# Patient Record
Sex: Female | Born: 1935 | Race: White | Hispanic: No | Smoking: Never smoker
Health system: Southern US, Community
[De-identification: ages and names within clinical notes are randomized; demographics above are authoritative.]

## PROBLEM LIST (undated history)

## (undated) ENCOUNTER — Emergency Department (HOSPITAL_COMMUNITY): Discharge status: Absent without leave | Payer: 59

## (undated) DIAGNOSIS — G4700 Insomnia, unspecified: Secondary | ICD-10-CM

## (undated) DIAGNOSIS — N3010 Interstitial cystitis (chronic) without hematuria: Secondary | ICD-10-CM

## (undated) DIAGNOSIS — I639 Cerebral infarction, unspecified: Secondary | ICD-10-CM

## (undated) DIAGNOSIS — I10 Essential (primary) hypertension: Secondary | ICD-10-CM

## (undated) DIAGNOSIS — E785 Hyperlipidemia, unspecified: Secondary | ICD-10-CM

## (undated) HISTORY — PX: ABDOMINAL HYSTERECTOMY: SHX81

---

## 1998-05-08 ENCOUNTER — Other Ambulatory Visit: Admission: RE | Admit: 1998-05-08 | Discharge: 1998-05-08 | Payer: Self-pay | Admitting: Urology

## 1999-10-22 ENCOUNTER — Emergency Department (HOSPITAL_COMMUNITY): Admission: EM | Admit: 1999-10-22 | Discharge: 1999-10-22 | Payer: Self-pay | Admitting: Internal Medicine

## 2000-01-16 ENCOUNTER — Other Ambulatory Visit: Admission: RE | Admit: 2000-01-16 | Discharge: 2000-01-16 | Payer: Self-pay | Admitting: Obstetrics and Gynecology

## 2000-11-14 ENCOUNTER — Encounter: Payer: Self-pay | Admitting: Internal Medicine

## 2000-11-14 ENCOUNTER — Encounter: Admission: RE | Admit: 2000-11-14 | Discharge: 2000-11-14 | Payer: Self-pay | Admitting: Internal Medicine

## 2001-03-04 ENCOUNTER — Encounter (INDEPENDENT_AMBULATORY_CARE_PROVIDER_SITE_OTHER): Payer: Self-pay | Admitting: Specialist

## 2001-03-04 ENCOUNTER — Ambulatory Visit (HOSPITAL_COMMUNITY): Admission: RE | Admit: 2001-03-04 | Discharge: 2001-03-04 | Payer: Self-pay | Admitting: Urology

## 2001-11-04 ENCOUNTER — Emergency Department (HOSPITAL_COMMUNITY): Admission: EM | Admit: 2001-11-04 | Discharge: 2001-11-04 | Payer: Self-pay | Admitting: Emergency Medicine

## 2001-11-04 ENCOUNTER — Encounter: Payer: Self-pay | Admitting: Emergency Medicine

## 2002-03-24 ENCOUNTER — Emergency Department (HOSPITAL_COMMUNITY): Admission: EM | Admit: 2002-03-24 | Discharge: 2002-03-24 | Payer: Self-pay | Admitting: Emergency Medicine

## 2002-04-14 ENCOUNTER — Other Ambulatory Visit: Admission: RE | Admit: 2002-04-14 | Discharge: 2002-04-14 | Payer: Self-pay | Admitting: Obstetrics and Gynecology

## 2004-01-11 ENCOUNTER — Encounter: Admission: RE | Admit: 2004-01-11 | Discharge: 2004-01-11 | Payer: Self-pay | Admitting: Gastroenterology

## 2004-12-07 ENCOUNTER — Ambulatory Visit (HOSPITAL_COMMUNITY): Admission: RE | Admit: 2004-12-07 | Discharge: 2004-12-07 | Payer: Self-pay

## 2005-01-02 ENCOUNTER — Emergency Department (HOSPITAL_COMMUNITY): Admission: EM | Admit: 2005-01-02 | Discharge: 2005-01-03 | Payer: Self-pay | Admitting: Emergency Medicine

## 2005-04-13 ENCOUNTER — Emergency Department (HOSPITAL_COMMUNITY): Admission: EM | Admit: 2005-04-13 | Discharge: 2005-04-14 | Payer: Self-pay | Admitting: Emergency Medicine

## 2006-04-27 ENCOUNTER — Encounter: Admission: RE | Admit: 2006-04-27 | Discharge: 2006-04-27 | Payer: Self-pay | Admitting: Orthopedic Surgery

## 2007-07-10 ENCOUNTER — Encounter: Admission: RE | Admit: 2007-07-10 | Discharge: 2007-07-10 | Payer: Self-pay | Admitting: Orthopedic Surgery

## 2007-09-28 ENCOUNTER — Encounter: Admission: RE | Admit: 2007-09-28 | Discharge: 2007-09-28 | Payer: Self-pay | Admitting: Neurosurgery

## 2008-03-03 ENCOUNTER — Inpatient Hospital Stay (HOSPITAL_COMMUNITY): Admission: RE | Admit: 2008-03-03 | Discharge: 2008-03-09 | Payer: Self-pay | Admitting: Neurosurgery

## 2008-03-04 ENCOUNTER — Ambulatory Visit: Payer: Self-pay | Admitting: Physical Medicine & Rehabilitation

## 2008-09-06 ENCOUNTER — Encounter: Admission: RE | Admit: 2008-09-06 | Discharge: 2008-09-06 | Payer: Self-pay | Admitting: Neurosurgery

## 2009-07-25 ENCOUNTER — Ambulatory Visit (HOSPITAL_COMMUNITY): Admission: RE | Admit: 2009-07-25 | Discharge: 2009-07-25 | Payer: Self-pay | Admitting: Neurosurgery

## 2010-05-25 ENCOUNTER — Encounter: Admission: RE | Admit: 2010-05-25 | Discharge: 2010-05-25 | Payer: Self-pay | Admitting: Internal Medicine

## 2010-07-02 ENCOUNTER — Emergency Department (HOSPITAL_COMMUNITY): Admission: EM | Admit: 2010-07-02 | Discharge: 2010-07-02 | Payer: Self-pay | Admitting: Emergency Medicine

## 2010-08-04 ENCOUNTER — Observation Stay (HOSPITAL_COMMUNITY): Admission: EM | Admit: 2010-08-04 | Discharge: 2010-08-05 | Payer: Self-pay | Admitting: Emergency Medicine

## 2010-11-11 ENCOUNTER — Encounter: Payer: Self-pay | Admitting: Internal Medicine

## 2011-01-02 LAB — CBC
HCT: 38.8 % (ref 36.0–46.0)
HCT: 40.7 % (ref 36.0–46.0)
Hemoglobin: 13 g/dL (ref 12.0–15.0)
Hemoglobin: 13.1 g/dL (ref 12.0–15.0)
MCH: 26 pg (ref 26.0–34.0)
MCH: 26.6 pg (ref 26.0–34.0)
MCHC: 32.2 g/dL (ref 30.0–36.0)
MCHC: 33.5 g/dL (ref 30.0–36.0)
MCV: 79.5 fL (ref 78.0–100.0)
MCV: 80.9 fL (ref 78.0–100.0)
Platelets: 235 10*3/uL (ref 150–400)
Platelets: 259 10*3/uL (ref 150–400)
RBC: 4.88 MIL/uL (ref 3.87–5.11)
RBC: 5.03 MIL/uL (ref 3.87–5.11)
RDW: 15.7 % — ABNORMAL HIGH (ref 11.5–15.5)
RDW: 16.1 % — ABNORMAL HIGH (ref 11.5–15.5)
WBC: 8 10*3/uL (ref 4.0–10.5)
WBC: 8.7 10*3/uL (ref 4.0–10.5)

## 2011-01-02 LAB — POCT CARDIAC MARKERS
CKMB, poc: 1.3 ng/mL (ref 1.0–8.0)
Myoglobin, poc: 65.4 ng/mL (ref 12–200)
Troponin i, poc: <0.05 ng/mL (ref 0.00–0.09)

## 2011-01-02 LAB — COMPREHENSIVE METABOLIC PANEL
ALT: 24 U/L (ref 0–35)
AST: 24 U/L (ref 0–37)
Albumin: 3.6 g/dL (ref 3.5–5.2)
Alkaline Phosphatase: 69 U/L (ref 39–117)
BUN: 12 mg/dL (ref 6–23)
CO2: 23 mEq/L (ref 19–32)
Calcium: 9.1 mg/dL (ref 8.4–10.5)
Chloride: 105 mEq/L (ref 96–112)
Creatinine, Ser: 0.9 mg/dL (ref 0.4–1.2)
GFR calc Af Amer: >60 mL/min (ref >60)
GFR calc non Af Amer: >60 mL/min (ref >60)
Glucose, Bld: 100 mg/dL — ABNORMAL HIGH (ref 70–99)
Potassium: 4.3 mEq/L (ref 3.5–5.1)
Sodium: 139 mEq/L (ref 135–145)
Total Bilirubin: 0.5 mg/dL (ref 0.3–1.2)
Total Protein: 6.5 g/dL (ref 6.0–8.3)

## 2011-01-02 LAB — URINALYSIS, ROUTINE W REFLEX MICROSCOPIC
Bilirubin Urine: NEGATIVE
Glucose, UA: NEGATIVE mg/dL
Hgb urine dipstick: NEGATIVE
Ketones, ur: NEGATIVE mg/dL
Nitrite: NEGATIVE
Protein, ur: NEGATIVE mg/dL
Specific Gravity, Urine: 1.014 (ref 1.005–1.030)
Urobilinogen, UA: 0.2 mg/dL (ref 0.0–1.0)
pH: 6 (ref 5.0–8.0)

## 2011-01-02 LAB — RAPID URINE DRUG SCREEN, HOSP PERFORMED
Amphetamines: NOT DETECTED
Barbiturates: NOT DETECTED
Benzodiazepines: POSITIVE — AB
Cocaine: NOT DETECTED
Opiates: NOT DETECTED
Tetrahydrocannabinol: NOT DETECTED

## 2011-01-02 LAB — DIFFERENTIAL
Basophils Absolute: 0 10*3/uL (ref 0.0–0.1)
Basophils Relative: 0 % (ref 0–1)
Eosinophils Absolute: 0.1 10*3/uL (ref 0.0–0.7)
Eosinophils Relative: 1 % (ref 0–5)
Lymphocytes Relative: 35 % (ref 12–46)
Lymphs Abs: 3.1 10*3/uL (ref 0.7–4.0)
Monocytes Absolute: 0.6 10*3/uL (ref 0.1–1.0)
Monocytes Relative: 7 % (ref 3–12)
Neutro Abs: 4.9 10*3/uL (ref 1.7–7.7)
Neutrophils Relative %: 57 % (ref 43–77)

## 2011-01-02 LAB — URINE MICROSCOPIC-ADD ON

## 2011-01-02 LAB — URINE CULTURE
Colony Count: 10000
Culture  Setup Time: 201110161136

## 2011-01-02 LAB — BASIC METABOLIC PANEL
BUN: 10 mg/dL (ref 6–23)
CO2: 23 mEq/L (ref 19–32)
Calcium: 9.3 mg/dL (ref 8.4–10.5)
Chloride: 105 mEq/L (ref 96–112)
Creatinine, Ser: 0.93 mg/dL (ref 0.4–1.2)
GFR calc Af Amer: >60 mL/min (ref >60)
GFR calc non Af Amer: 58 mL/min — ABNORMAL LOW (ref >60)
Glucose, Bld: 106 mg/dL — ABNORMAL HIGH (ref 70–99)
Potassium: 3.8 mEq/L (ref 3.5–5.1)
Sodium: 136 mEq/L (ref 135–145)

## 2011-01-02 LAB — TSH: TSH: 5.312 u[IU]/mL — ABNORMAL HIGH (ref 0.350–4.500)

## 2011-01-02 LAB — T4, FREE: Free T4: 1.43 ng/dL (ref 0.80–1.80)

## 2011-01-02 LAB — SEDIMENTATION RATE: Sed Rate: 7 mm/hr (ref 0–22)

## 2011-01-02 LAB — MAGNESIUM: Magnesium: 2 mg/dL (ref 1.5–2.5)

## 2011-01-03 LAB — POCT URINALYSIS DIPSTICK
Bilirubin Urine: NEGATIVE
Glucose, UA: NEGATIVE mg/dL
Hgb urine dipstick: NEGATIVE
Ketones, ur: NEGATIVE mg/dL
Nitrite: NEGATIVE
Protein, ur: NEGATIVE mg/dL
Specific Gravity, Urine: 1.02 (ref 1.005–1.030)
Urobilinogen, UA: 0.2 mg/dL (ref 0.0–1.0)
pH: 5.5 (ref 5.0–8.0)

## 2011-01-03 LAB — POCT I-STAT, CHEM 8
BUN: 21 mg/dL (ref 6–23)
Calcium, Ion: 1.19 mmol/L (ref 1.12–1.32)
Chloride: 107 mEq/L (ref 96–112)
Creatinine, Ser: 1.1 mg/dL (ref 0.4–1.2)
Glucose, Bld: 113 mg/dL — ABNORMAL HIGH (ref 70–99)
HCT: 46 % (ref 36.0–46.0)
Hemoglobin: 15.6 g/dL — ABNORMAL HIGH (ref 12.0–15.0)
Potassium: 4.2 mEq/L (ref 3.5–5.1)
Sodium: 138 mEq/L (ref 135–145)
TCO2: 22 mmol/L (ref 0–100)

## 2011-01-03 LAB — TSH: TSH: 1.556 u[IU]/mL (ref 0.350–4.500)

## 2011-03-05 NOTE — Op Note (Signed)
Wendy Andrews, Wendy Andrews NO.:  [id number]   MEDICAL RECORD NO.:  [id number]          PATIENT TYPE:  INP   LOCATION:  2899                         FACILITY:  [facility]   PHYSICIAN:  Cristi Loron, M.D.DATE OF BIRTH:  [birth year]   DATE OF PROCEDURE:  03/03/2008  DATE OF DISCHARGE:                               OPERATIVE REPORT   BRIEF HISTORY:  The patient is a 77 year old white female who suffered  from back and leg pain consistent with neurogenic claudication.  She was  worked up with lumbar MRI and CT which demonstrated the patient had a  spondylolisthesis at L4-L5 with severe stenosis as well as some lateral  recess stenosis of L5-S1 on the left.  I discussed various treatments  with the patient including surgery.  She has weighed the risks,  benefits, and alternatives of the surgery and proceeded with an L4-L5  decompression fusion with left L5 laminotomy and foraminotomy.   PREOPERATIVE DIAGNOSES:  1. L4-L5 grade 1 acquired spondylolisthesis.  2. Spinal stenosis.  3. Degenerative disk disease.  4. Facet arthropathy.  5. Left L5-S1 lateral recess stenosis.   POSTOPERATIVE DIAGNOSES:  1. L4-L5 grade 1 acquired spondylolisthesis.  2. Spinal stenosis.  3. Degenerative disk disease.  4. Facet arthropathy.  5. Left L5-S1 lateral recess stenosis.   PROCEDURE:  Bilateral L4 laminotomies and foraminotomies to decompress  the bilateral L4-L5 nerve roots.  Left L5 laminotomy to decompress left  S1 nerve root; L4-L5 transforaminal lumbar interbody fusion with local  morselized autograft bone and Actifuse/Vitoss bone graft extender;  insertion of L4-L5 interbody prosthesis (Capstone PEEK interbody  prosthesis); L4-L5 posterior nonsegmental instrumentation with Legacy  titanium pedicle screws and rods; L4-L5 posterolateral arthrodesis with  local morselized autograft bone, Vitoss bone graft extender and Actifuse  bone graft extender.   SURGEON:  Cristi Loron,  MD   ASSISTANT:  None.   ANESTHESIA:  Endotracheal.   ESTIMATED BLOOD LOSS:  250 mL.   SPECIMENS:  None.   DRAINS:  None.   COMPLICATIONS:  None.   PROCEDURE IN DETAIL:  The patient was brought to the operating room by  the anesthesia team.  General endotracheal anesthesia was induced.  The  patient was turned to the prone position on Wilson frame.  The  lumbosacral region was then prepared with Betadine scrub and Betadine  solution.  Sterile drapes were applied.  I then injected the area to be  incised with Marcaine with epinephrine solution, used a scalpel to make  a linear midline incision over the L4-L5 and L5-S1 interspaces.  I used  electrocautery to perform a bilateral subperiosteal dissection exposing  the spinous process and laminae of L3, L4, L5, and upper sacrum.  We  then obtained intraoperative radiograph to confirm our location and  inserted the Versa-Trac for exposure.  We began the decompression by  performing a left L5 and bilateral L4 laminotomies.  This decompression  was in excess of what was required to do a simple interbody fusion  because of the patient's severe facet arthropathy and spinal stenosis.  I then widened the laminotomies bilaterally at L4  removing the  ligamentum flavum, the medial aspect of facets.  I performed wide  foraminotomy about the bilateral L4-L5 nerve roots completing and  decompressing these nerves.  I then widened the laminotomy on the left  at L5 and performed a foraminotomy about the left S1 nerve root  completing and decompressing this nerve root.   Having completed the decompression, we now turned our attention to the  arthrodesis.  I incised the right L4-L5 intervertebral disk with a 15 mm  scalpel.  I performed a partial intervertebral discectomy with a  pituitary forceps.  We then prepared the vertebral endplates for the  interbody fusion by using the Epstein-Scoville curettes and removed the  soft tissue and the sized  disk.  We then used trial spacers and  determined to use a 12 x 26 mm Capstone PEEK interbody prosthesis.  I  prefilled this prosthesis with combination of Vitoss bone graft extender  and local autograft bone we obtained during the decompression.  I also  filled ventrally in the disk space with these substance as well.  We  then placed the prosthesis after retracting the neural structures out of  harms way.  We turned sideways using the bone tamps with good snug fit  of the prosthesis.  We then filled posteriorly in the disk space with  local autograft bone Vitoss and Actifuse completing the transforaminal  lumbar interbody fusion.   We now turned our attention to the instrumentation.  Under fluoroscopic  guidance, I cannulated the bilateral L4 and L5 pedicles with a bone  probe.  I tapped the pedicles with 5.5 tap and inserted a 6.5 x 50 mm  pedicle screws bilaterally at L4 and L5 under fluoroscopic guidance.  I  should mention that prior to placing the pedicle screw, I tapped the  pedicle with a bone probe to rule out cortical breeches.  After placing  the pedicle screws, we palpated along the medial aspect of the L4-L5  pedicles and noted there were no cortical breeches and the L4-L5 nerve  roots were not injured.  We then connected the unilateral pedicle screws  with lordotic rod.  We compressed the construct.  We then secured the  rod in place with the caps.  This completed the instrumentation.   We now turned our attention to posterolateral arthrodesis.  We used a  high-speed drill to decorticate the remainder of the L4-L5 facets pars  and transverse processes.  We then laid a combination of local autograft  bone.  Vitoss bone graft extender and Actifuse bone graft extender over  these decorticated posterolateral structures completing the  posterolateral arthrodesis.   We then inspected the thecal sac at L4-L5 and bilateral L4-L5 nerve  roots in the left and S1 nerve root and  noted the neural structures were  well decompressed.  We obtained hemostasis, irrigated the wound out with  bacitracin solution, and then removed the retractor.  We then  reapproximated the patient's thoracolumbar fascia with interrupted #1  Vicryl suture, subcutaneous tissue with interrupted 2-0 Vicryl suture,  and skin with Steri-Strips and Benzoin.  The wound was then coated with  bacitracin ointment.  A sterile dressing was applied.  The drapes were  removed and the patient was subsequently returned to the supine position  where she was extubated by the anesthesia team and transported to the  postanesthesia care unit in stable condition.  All sponge, instrument,  and needle counts were correct at the end of this case.  Cristi Loron, M.D.  Electronically Signed     JDJ/MEDQ  D:  03/03/2008  T:  03/04/2008  Job:  [id number]

## 2011-03-05 NOTE — Discharge Summary (Signed)
[name], Wendy Andrews NO.:  [id number]   MEDICAL RECORD NO.:  [id number]          PATIENT TYPE:  INP   LOCATION:  3013                         FACILITY:  [facility]   PHYSICIAN:  Cristi Loron, M.D.DATE OF BIRTH:  12/13/1929   DATE OF ADMISSION:  03/03/2008  DATE OF DISCHARGE:  03/09/2008                               DISCHARGE SUMMARY   BRIEF HISTORY:  The patient is a 77 year old white female who suffered  from back and leg pain consistent with neurogenic claudication.  She was  worked up with lumbar MRI and CT which demonstrated the patient had  spondylolisthesis at L4-5 with severe stenosis and some lateral recess  stenosis at L5-S1 on the left.  I discussed the various treatment  options with the patient including surgery.  She has weighed the risks,  benefits and alternatives to surgery and decided to proceed with an L4-5  decompression fusion with a left L5 laminotomy and foraminotomy.   For further details of this admission please refer to the typed history  and physical.   HOSPITAL COURSE:  The patient's was admitted to [facility] on  Mar 03, 2008, and on the day of admission I performed an L4-5  decompression and fusion.  The surgery went well (for full details of  this operation please refer to the operative note).   POSTOPERATIVE COURSE:  The patient's postoperative course was remarkable  only for a urinary tract infection.  She was treated with Cipro.  We had  PT/OT and PMR see the patient.  They did not think she needed inpatient  rehabilitation.  By Mar 09, 2008, the patient was afebrile, vital signs  stable.  She was eating well, ambulating well and felt ready to be  transferred to [facility] skilled nursing facility and was transferred.   DISCHARGE INSTRUCTIONS:  The patient was given written discharge  instructions and instructed to follow up with me in approximately 4  weeks.   DISCHARGE PRESCRIPTIONS:  Percocet 10/325 #100 one p.o.  q.4 h pain for  pain, valium 5 mg #50 one p.o. q.8 h p.r.n. for muscle spasms.   FINAL DIAGNOSES:  L4-5 grade 1 acquired spondylolisthesis; left L5-S1  lateral recess stenosis; spinal stenosis, degenerative disk disease,  facet arthropathy, lumbar radiculopathy/myelopathy.   PROCEDURE PERFORMED:  1. Bilateral L4 laminotomies and foraminotomies.  2. Decompression bilateral L4 and L5 nerve roots; left L5 laminotomy      to decompress the left S1 nerve root; L4-5 transforaminal lumbar      interbody fusion with local morselized autograft bone and      Actifuse/Vitoss bone graft      extender; insertion of L4-5 interbody prosthesis (Capstone PEEK      interbody prosthesis); L4-5 posterior nonsegmental instrumentation      with Legacy titanium pedicle screws and rods; L4-5 posterolateral      arthrodesis with local morselized autograft bone and Vitoss bone      graft extender and Actifuse bone graft extender.      Cristi Loron, M.D.  Electronically Signed     JDJ/MEDQ  D:  03/09/2008  T:  03/09/2008  Job:  [id number]

## 2011-03-05 NOTE — H&P (Signed)
Wendy Andrews, Wendy Andrews             ACCOUNT NO.:  [id number]   MEDICAL RECORD NO.:  [id number]         PATIENT TYPE:  LINP   LOCATION:                               FACILITY:  [facility]   PHYSICIAN:  Georges Lynch. Gioffre, M.D.DATE OF BIRTH:  [birth year]   DATE OF ADMISSION:  08/12/2007  DATE OF DISCHARGE:                              HISTORY & PHYSICAL   CHIEF COMPLAINT:  Lower back and bilateral leg pain.   HISTORY OF PRESENT ILLNESS:  The patient is a 77 year old female who has  been having lower back pain and bilateral leg pains with burning  sensation in both feet, feeling a sensation like both legs are going to  sleep.  She has been evaluated with a CT myelogram, and an MRI.  The  findings are consistent with severe spinal stenosis and complete block  at L4-5, L5-S1.  The patient has elected to proceed with a central  decompressive lumbar laminectomy at L4-5, L4-S1; and has talked with Dr.  Darrelyn Hillock at length about this upcoming procedure, and has elected to  proceed.   PAST MEDICAL HISTORY INCLUDES:  1. Manic depression.  2. Hypertension.  3. Reflux disease.  4. Recent cystitis with current treatment by Dr. Larey Dresser.  5. History of thyroid disease.   ALLERGIES:  Tetanus shots and penicillin.   CURRENT MEDICATIONS:  1. Seroquel  2. Toprol.  3. Aspirin.  4. __________ .  5. Diazepam.  6. Lipitor.  7. Lunesta.  8. Sulindac.  9. Alprazolam.  10.Levothyroxine.  11.Premarin  12.Prevacid.   PAST SURGICAL HISTORY INCLUDES:  1. A hysterectomy.  2. Knee surgery.  The patient denies any complications of the above-      mentioned surgical procedures.   REVIEW OF SYSTEMS:  Positive for issues related to her  anxiety/depression.  She has gone through multiple psychiatrists with  medication changes.  Current psychiatrist, Dr. __________ .  PULMONARY:  The patient denies.  CARDIAC/GI:  She denies.  GU:  She is currently  being treated for recent cystitis with a follow up in  the next couple of  days.  ENDOCRINE/HEMATOLOGIC:  She denies any problems at this time.   FAMILY MEDICAL HISTORY:  Mother is deceased from heart failure.  Father  is deceased from complications related to an MI, blood clots, and CVA.   SOCIAL HISTORY:  The patient is divorced.  She is retired, does not  smoke, or use alcohol.  She has no children.  No one to care for her  after surgery.  Lives in a single-story home.  Indicates that she will  need assistance postsurgery for care and discussed home health nurse  versus skilled nursing.   PHYSICAL EXAM:  VITAL SIGNS:  Height is 5 feet 4 inches.  Weight is 200  pounds.  Blood pressure 118/82, pulse of 74 and regular, respirations  12, patient is afebrile.  GENERAL:  This is a healthy-appearing, heavy-set female conscious,  alert, appears to be angry today.  HEENT:  Head was normocephalic.  Pupils equal, round and reactive.  Extraocular movements intact.  BACK:  Good range of motion of  her cervical spine  CHEST:  Lung sounds were clear and equal bilaterally.  HEART:  Regular rate and rhythm.  No murmurs, rubs or gallops.  ABDOMEN:  Obese-like, soft, bowel sounds present.  CVA region nontender.  EXTREMITIES:  Upper extremity has good range of motion.  Lower  Extremities have good range of motion of both hips, knees, and ankles.  NEURO:  The patient was conscious, alert, and a little bit on the angry  side today with history of panic/manic depression.  Otherwise appears to  be appropriate.  She is intact light touch sensation in bilateral lower  extremities.  Motor strength is 5/5 right and left.  She does have pain  in the lower back today with range of motion of her back.  PERIPHERAL VASCULAR: Carotid pulses were 2+ no bruits.  Radial pulses  2+, dorsalis pedis pulses were 1+.  She had lower extremity edema.  Some  varicosities, but no pigmentation changes.  BREAST, RECTAL AND GU:  Exams were deferred at this time.   IMPRESSION:  1.  Severe spinal stenosis L4-5, L5-S1.  2. History of manic depression.  3. Hypertension.  4. Reflux disease.  5. History of cystitis currently under treatment.  6. History of thyroid disease.   PLAN:  The patient will still need medical clearance for this upcoming  surgical procedure.  She plans to have an appointment with Dr. Delorse Lek on October 16.  She is also having a followup appointment  with Dr. Vonita Moss for her evaluation of her cystitis prior to this  surgical procedure.  Otherwise, the patient will undergo the routine  labs and tests prior to having this central decompression by Dr. Darrelyn Hillock  at [facility] on August 12, 2007.      Jamelle Rushing, P.A.    ______________________________  Georges Lynch Darrelyn Hillock, M.D.    RWK/MEDQ  D:  08/03/2007  T:  08/03/2007  Job:  [id number]

## 2011-03-08 NOTE — Op Note (Signed)
[facility]  Patient:    Wendy Andrews, Wendy Andrews                    MRN: [id number] Proc. Date: 03/04/01 Adm. Date:  [id number] Attending:  Lauree Chandler                           Operative Report  PREOPERATIVE DIAGNOSIS:  Interstitial cystitis.  POSTOPERATIVE DIAGNOSIS:  Interstitial cystitis.  PROCEDURE:  Cystoscopy, urethral dilation, hydraulic dilation of bladder, cold cup bladder biopsy, and instillation of Clorpactin and Marcaine.  SURGEON:  Maretta Bees. Vonita Moss, M.D.  ANESTHESIA:  General.  INDICATIONS:  This 77 year old lady has had chronic bladder pain and is on Elmiron and various therapies for interstitial cystitis.  She has had some exacerbation lately and comes in for further evaluation and work-up.  DESCRIPTION OF PROCEDURE:  The patient was brought to the operating room, and the urethra was dilated to 30 [place] and was somewhat tight.  Cystoscopy was performed.  There were no stones, tumors, or inflammatory lesions in the bladder.  She then had her bladder filled to 750 cc.  Looking back in, she had scattered submucosal petechiae and hemorrhage in all four quadrants of the bladder.  Cold cup bladder biopsies were taken from representative hemorrhagic areas in the right and left bladder walls.  The biopsy sites were fulgurated with the Bugbee electrode.  She then underwent 10 minutes of irrigation of the bladder with 0.4% Clorpactin solution.  The bladder was then irrigated with sterile water and 50 cc of Marcaine solution left in the bladder.  She was taken to the recovery room in good condition. DD:  03/04/01 TD:  03/04/01 Job: [id number]

## 2011-03-08 NOTE — Op Note (Signed)
NAMELATERIA, ALDERMAN             ACCOUNT NO.:  [id number]   MEDICAL RECORD NO.:  [id number]          PATIENT TYPE:  AMB   LOCATION:  ENDO                         FACILITY:  [facility]   PHYSICIAN:  John C. Madilyn Fireman, M.D.    DATE OF BIRTH:  10-May-1930   DATE OF PROCEDURE:  12/07/2004  DATE OF DISCHARGE:                                 OPERATIVE REPORT   PROCEDURE:  Colonoscopy.   INDICATIONS FOR PROCEDURE:  Average-risk colon cancer screening in a 77-year-  old patient with no prior screening.   PROCEDURE:  The patient was placed in the left lateral decubitus position  and placed on the pulse monitor with continuous low-flow oxygen delivered by  nasal cannula. She was sedated with 8 milligrams IV Versed and 75 mcg IV  fentanyl. The Olympus video colonoscope was inserted into the rectum and  advanced to the cecum, confirmed by transillumination of McBurney's point,  visualization of ileocecal valve and appendiceal orifice. Prep was  excellent. The cecum, descending, transverse, descending, sigmoid and rectum  all appeared normal with no masses, polyps, diverticula or other mucosal  abnormalities. A retroflexed view of the anus revealed no obvious internal  hemorrhoids. The scope was then withdrawn and the patient returned to the  recovery room in stable condition. She tolerated the procedure well. There  were no immediate complications.   IMPRESSION:  Normal colonoscopy.   PLAN:  Next colon screening by sigmoidoscopy in 5 years.      JCH/MEDQ  D:  12/07/2004  T:  12/07/2004  Job:  [id number]   cc:   Antony Madura, M.D.  [address], [address]  [place]  [place] [address]  Fax: [phone]

## 2011-06-09 ENCOUNTER — Encounter (HOSPITAL_COMMUNITY): Payer: Self-pay | Admitting: Radiology

## 2011-06-09 ENCOUNTER — Observation Stay (HOSPITAL_COMMUNITY)
Admission: EM | Admit: 2011-06-09 | Discharge: 2011-06-11 | Disposition: A | Discharge status: Home / Self Care | Payer: Medicare Other | Attending: Internal Medicine | Admitting: Internal Medicine

## 2011-06-09 ENCOUNTER — Emergency Department (HOSPITAL_COMMUNITY): Payer: Medicare Other

## 2011-06-09 DIAGNOSIS — E785 Hyperlipidemia, unspecified: Secondary | ICD-10-CM | POA: Insufficient documentation

## 2011-06-09 DIAGNOSIS — K219 Gastro-esophageal reflux disease without esophagitis: Secondary | ICD-10-CM | POA: Insufficient documentation

## 2011-06-09 DIAGNOSIS — R109 Unspecified abdominal pain: Principal | ICD-10-CM | POA: Insufficient documentation

## 2011-06-09 DIAGNOSIS — E039 Hypothyroidism, unspecified: Secondary | ICD-10-CM | POA: Insufficient documentation

## 2011-06-09 DIAGNOSIS — I10 Essential (primary) hypertension: Secondary | ICD-10-CM | POA: Insufficient documentation

## 2011-06-09 DIAGNOSIS — F411 Generalized anxiety disorder: Secondary | ICD-10-CM | POA: Insufficient documentation

## 2011-06-09 DIAGNOSIS — R110 Nausea: Secondary | ICD-10-CM | POA: Insufficient documentation

## 2011-06-09 HISTORY — DX: Essential (primary) hypertension: I10

## 2011-06-09 LAB — DIFFERENTIAL
Basophils Absolute: 0 10*3/uL (ref 0.0–0.1)
Basophils Relative: 0 % (ref 0–1)
Eosinophils Absolute: 0 10*3/uL (ref 0.0–0.7)
Eosinophils Relative: 0 % (ref 0–5)
Lymphocytes Relative: 11 % — ABNORMAL LOW (ref 12–46)
Lymphs Abs: 1.1 10*3/uL (ref 0.7–4.0)
Monocytes Absolute: 0.4 10*3/uL (ref 0.1–1.0)
Monocytes Relative: 4 % (ref 3–12)
Neutro Abs: 8.7 10*3/uL — ABNORMAL HIGH (ref 1.7–7.7)
Neutrophils Relative %: 85 % — ABNORMAL HIGH (ref 43–77)

## 2011-06-09 LAB — CBC
HCT: 38.9 % (ref 36.0–46.0)
Hemoglobin: 13.2 g/dL (ref 12.0–15.0)
MCH: 26.9 pg (ref 26.0–34.0)
MCHC: 33.9 g/dL (ref 30.0–36.0)
MCV: 79.4 fL (ref 78.0–100.0)
Platelets: 241 10*3/uL (ref 150–400)
RBC: 4.9 MIL/uL (ref 3.87–5.11)
RDW: 15.4 % (ref 11.5–15.5)
WBC: 10.2 10*3/uL (ref 4.0–10.5)

## 2011-06-09 LAB — COMPREHENSIVE METABOLIC PANEL
ALT: 9 U/L (ref 0–35)
AST: 13 U/L (ref 0–37)
Albumin: 3.8 g/dL (ref 3.5–5.2)
Alkaline Phosphatase: 79 U/L (ref 39–117)
BUN: 19 mg/dL (ref 6–23)
CO2: 26 mEq/L (ref 19–32)
Calcium: 9.8 mg/dL (ref 8.4–10.5)
Chloride: 101 mEq/L (ref 96–112)
Creatinine, Ser: 0.7 mg/dL (ref 0.50–1.10)
GFR calc Af Amer: >60 mL/min (ref >60)
GFR calc non Af Amer: >60 mL/min (ref >60)
Glucose, Bld: 138 mg/dL — ABNORMAL HIGH (ref 70–99)
Potassium: 4.6 mEq/L (ref 3.5–5.1)
Sodium: 137 mEq/L (ref 135–145)
Total Bilirubin: 0.2 mg/dL — ABNORMAL LOW (ref 0.3–1.2)
Total Protein: 7.6 g/dL (ref 6.0–8.3)

## 2011-06-09 LAB — LIPASE, BLOOD: Lipase: 25 U/L (ref 11–59)

## 2011-06-09 LAB — URINALYSIS, ROUTINE W REFLEX MICROSCOPIC
Bilirubin Urine: NEGATIVE
Glucose, UA: NEGATIVE mg/dL
Ketones, ur: NEGATIVE mg/dL
Leukocytes, UA: NEGATIVE
Nitrite: NEGATIVE
Protein, ur: NEGATIVE mg/dL
Specific Gravity, Urine: 1.013 (ref 1.005–1.030)
Urobilinogen, UA: 0.2 mg/dL (ref 0.0–1.0)
pH: 7 (ref 5.0–8.0)

## 2011-06-09 LAB — URINE MICROSCOPIC-ADD ON

## 2011-06-09 MED ORDER — IOHEXOL 300 MG/ML  SOLN
100.0000 mL | Freq: Once | INTRAMUSCULAR | Status: AC | PRN
  Administered 2011-06-09: 100 mL via INTRAVENOUS

## 2011-06-10 LAB — DIFFERENTIAL
Basophils Absolute: 0 10*3/uL (ref 0.0–0.1)
Basophils Relative: 0 % (ref 0–1)
Eosinophils Absolute: 0 10*3/uL (ref 0.0–0.7)
Eosinophils Relative: 0 % (ref 0–5)
Lymphocytes Relative: 22 % (ref 12–46)
Lymphs Abs: 2.2 10*3/uL (ref 0.7–4.0)
Monocytes Absolute: 1 10*3/uL (ref 0.1–1.0)
Monocytes Relative: 10 % (ref 3–12)
Neutro Abs: 7.1 10*3/uL (ref 1.7–7.7)
Neutrophils Relative %: 68 % (ref 43–77)

## 2011-06-10 LAB — COMPREHENSIVE METABOLIC PANEL
ALT: 7 U/L (ref 0–35)
AST: 14 U/L (ref 0–37)
Albumin: 3.4 g/dL — ABNORMAL LOW (ref 3.5–5.2)
Alkaline Phosphatase: 74 U/L (ref 39–117)
BUN: 14 mg/dL (ref 6–23)
CO2: 23 mEq/L (ref 19–32)
Calcium: 9.2 mg/dL (ref 8.4–10.5)
Chloride: 98 mEq/L (ref 96–112)
Creatinine, Ser: 0.69 mg/dL (ref 0.50–1.10)
GFR calc Af Amer: >60 mL/min (ref >60)
GFR calc non Af Amer: >60 mL/min (ref >60)
Glucose, Bld: 105 mg/dL — ABNORMAL HIGH (ref 70–99)
Potassium: 4.2 mEq/L (ref 3.5–5.1)
Sodium: 135 mEq/L (ref 135–145)
Total Bilirubin: 0.2 mg/dL — ABNORMAL LOW (ref 0.3–1.2)
Total Protein: 6.8 g/dL (ref 6.0–8.3)

## 2011-06-10 LAB — URINALYSIS, ROUTINE W REFLEX MICROSCOPIC
Bilirubin Urine: NEGATIVE
Glucose, UA: NEGATIVE mg/dL
Ketones, ur: NEGATIVE mg/dL
Leukocytes, UA: NEGATIVE
Nitrite: NEGATIVE
Protein, ur: NEGATIVE mg/dL
Specific Gravity, Urine: 1.016 (ref 1.005–1.030)
Urobilinogen, UA: 0.2 mg/dL (ref 0.0–1.0)
pH: 6.5 (ref 5.0–8.0)

## 2011-06-10 LAB — CBC
HCT: 37.4 % (ref 36.0–46.0)
Hemoglobin: 12.8 g/dL (ref 12.0–15.0)
MCH: 26.8 pg (ref 26.0–34.0)
MCHC: 34.2 g/dL (ref 30.0–36.0)
MCV: 78.2 fL (ref 78.0–100.0)
Platelets: 275 10*3/uL (ref 150–400)
RBC: 4.78 MIL/uL (ref 3.87–5.11)
RDW: 15.5 % (ref 11.5–15.5)
WBC: 10.4 10*3/uL (ref 4.0–10.5)

## 2011-06-10 LAB — URINE MICROSCOPIC-ADD ON

## 2011-06-10 LAB — TSH: TSH: 0.653 u[IU]/mL (ref 0.350–4.500)

## 2011-06-11 LAB — BASIC METABOLIC PANEL
BUN: 19 mg/dL (ref 6–23)
CO2: 26 mEq/L (ref 19–32)
Calcium: 9.3 mg/dL (ref 8.4–10.5)
Chloride: 104 mEq/L (ref 96–112)
Creatinine, Ser: 0.77 mg/dL (ref 0.50–1.10)
GFR calc Af Amer: >60 mL/min (ref >60)
GFR calc non Af Amer: >60 mL/min (ref >60)
Glucose, Bld: 101 mg/dL — ABNORMAL HIGH (ref 70–99)
Potassium: 4 mEq/L (ref 3.5–5.1)
Sodium: 138 mEq/L (ref 135–145)

## 2011-06-27 NOTE — Discharge Summary (Signed)
NAMEDEMETRA, Andrews NO.:  [id number]  MEDICAL RECORD NO.:  [id number]  LOCATION:  4501                         FACILITY:  [facility]  PHYSICIAN:  Ramiro Harvest, MD    DATE OF BIRTH:  06-07-1930  DATE OF ADMISSION:  06/09/2011 DATE OF DISCHARGE:  06/11/2011                        DISCHARGE SUMMARY    PRIMARY CARE PHYSICIAN:  Antony Madura, MD  DISCHARGE DIAGNOSES: 1. Abdominal pain of questionable etiology, likely secondary to     gastroenteritis versus constipation, resolved. 2. Hypertension. 3. Hypothyroidism. 4. Gastroesophageal reflux disease. 5. Hyperlipidemia. 6. History of constipation. 7. Anxiety. 8. Status post hysterectomy/bilateral salpingo-oophorectomy greater     than 40 years ago. 9. L4-L5 fusion 3 years ago for significant spinal stenosis.  DISCHARGE MEDICATIONS: 1. Colace 100 mg p.o. b.i.d. 2. Flonase 2 sprays in each nostril twice daily. 3. Lipitor 10 mg p.o. nightly. 4. Lisinopril HCTZ 10/12.5 half a tablet p.o. daily. 5. Mirtazapine 15 mg p.o. daily. 6. Multivitamin 1 tablet p.o. daily. 7. Nexium 40 mg p.o. daily. 8. Premarin 0.9 mg p.o. daily. 9. Synthroid 50 mcg p.o. daily. 10.Tramadol 50 mg p.o. b.i.d. p.r.n. 11.Vitamin D3 1 tablet p.o. daily. 12.Xanax 0.25 mg p.o. b.i.d.  DISPOSITION AND FOLLOWUP:  The patient will be discharged home.  The patient is to follow up with her PCP, Dr. Su Hilt as previously scheduled on June 13, 2011, to follow up on this hospitalization, the patient's abdominal pain will need to be reassessed.  The patient has also been placed on some Flonase to help with some postnasal drip.  CONSULTATIONS DONE:  None.  PROCEDURES PERFORMED:  A CT of the abdomen and pelvis was done on June 09, 2011, that showed no acute abnormalities identified within the abdomen or pelvis, cholelithiasis, gallbladder, otherwise unremarkable in appearance.  Degenerative change seen along the lumbar spine status post  spinal fusion at L4-L5.  BRIEF ADMISSION HISTORY AND PHYSICAL:  Wendy Andrews is a 77 year old Caucasian female with history of anxiety, hypothyroidism, hyperlipidemia, L4-5 fusion in 2009, status post hysterectomy and bilateral salpingo-oophorectomy, presented to the ED with lower midline abdominal pain.  The patient was in the usual state of health until around lunch time on June 09, 2011, when she had sudden onset of stabbing pain in her lower midline abdomen radiating to her bottom of her back.  It was initially severe and in sudden, it felt like knife- like and in the ED, she described it as an aching pain.  The patient tried to go to sleep but awoke around 11:00 p.m. to midnight.  Pain was still there and thus presented to the ED.  In the ED, vitals were stable.  She was afebrile, blood pressure 177/72, pulse of 93, respirations 20, satting 100% on room air.  Through emergency ED, workup had been fairly unrevealing including CBC with a white count of 10.2. Chemistry panel was unremarkable.  LFTs and lipase were unremarkable. Urinalysis which was done showed a very small amount of hemoglobin with the 3-6 rbc's, but no signs of infection.  CT of the abdomen and pelvis was done, which showed the bladder was distended with some cholelithiasis and degenerative changes status post L4-L5 fusion.  The  patient was given a liter of IV fluids in the ED, some morphine and fentanyl and Zofran with minimal relief.  She was admitted for persistent abdominal pain and very minimally elevated white count.  For the rest of admission history and physical, please see H and P dictated by Dr. Kaylyn Layer of job number [id number].  HOSPITAL COURSE: 1. Abdominal pain.  The patient was admitted with abdominal pain and     was felt likely etiology could be a benign viral-type     gastroenteritis versus constipation.  The patient was placed on a     bowel regimen.  She was also placed on a stool softener of  Colace     100 mg twice daily.  CT of the abdomen and pelvis were obtained,     results as stated above which were essentially negative.     Chemistry, CBCs which were done, were also negative.  The patient     was admitted with observation, placed on supportive care.  She     improved clinically, did not have any further abdominal pain for     the rest of the hospitalization.  Repeat urinalysis which was done     was negative.  A TSH which was obtained was within normal limits at     0.653.  The patient tolerated oral intake and improved clinically.     By day of discharge, the patient was in stable and improved     condition and pain free and will follow up with a PCP as an     outpatient.  The rest of the patient's chronic medical issues     remained stable throughout the hospitalization.  The patient will     be discharged in stable and improved condition.  On day of     discharge vital signs, temperature 97.8, pulse of 87, respirations     16, blood pressure 119/78, satting 97% on room air.  DISCHARGE LABS:  Sodium 138, potassium 4, chloride 104, bicarb 26, glucose 101, BUN 19, creatinine 0.77, calcium of 9.3, TSH of 0.653. This has been pleasure taking care of Ms. Wendy Andrews     Ramiro Harvest, MD     DT/MEDQ  D:  06/11/2011  T:  06/11/2011  Job:  [id number]  cc:   Antony Madura, M.D.  Electronically Signed by Ramiro Harvest MD on 06/27/2011 12:17:02 PM

## 2011-07-08 ENCOUNTER — Emergency Department (HOSPITAL_COMMUNITY): Payer: Medicare Other

## 2011-07-08 ENCOUNTER — Inpatient Hospital Stay (HOSPITAL_COMMUNITY)
Admission: EM | Admit: 2011-07-08 | Discharge: 2011-07-11 | DRG: 645 | Disposition: A | Discharge status: Home Health | Payer: Medicare Other | Attending: Family Medicine | Admitting: Family Medicine

## 2011-07-08 DIAGNOSIS — I10 Essential (primary) hypertension: Secondary | ICD-10-CM | POA: Diagnosis present

## 2011-07-08 DIAGNOSIS — E039 Hypothyroidism, unspecified: Secondary | ICD-10-CM | POA: Diagnosis present

## 2011-07-08 DIAGNOSIS — F329 Major depressive disorder, single episode, unspecified: Secondary | ICD-10-CM | POA: Diagnosis present

## 2011-07-08 DIAGNOSIS — E876 Hypokalemia: Secondary | ICD-10-CM | POA: Diagnosis present

## 2011-07-08 DIAGNOSIS — F411 Generalized anxiety disorder: Secondary | ICD-10-CM | POA: Diagnosis present

## 2011-07-08 DIAGNOSIS — E785 Hyperlipidemia, unspecified: Secondary | ICD-10-CM | POA: Diagnosis present

## 2011-07-08 DIAGNOSIS — E236 Other disorders of pituitary gland: Principal | ICD-10-CM | POA: Diagnosis present

## 2011-07-08 DIAGNOSIS — F3289 Other specified depressive episodes: Secondary | ICD-10-CM | POA: Diagnosis present

## 2011-07-08 LAB — URINALYSIS, ROUTINE W REFLEX MICROSCOPIC
Bilirubin Urine: NEGATIVE
Glucose, UA: NEGATIVE mg/dL
Nitrite: NEGATIVE
Protein, ur: NEGATIVE mg/dL
Specific Gravity, Urine: 1.011 (ref 1.005–1.030)
Urobilinogen, UA: 0.2 mg/dL (ref 0.0–1.0)
pH: 6 (ref 5.0–8.0)

## 2011-07-08 LAB — CBC
HCT: 36.7 % (ref 36.0–46.0)
Hemoglobin: 12.6 g/dL (ref 12.0–15.0)
MCH: 26.9 pg (ref 26.0–34.0)
MCHC: 34.3 g/dL (ref 30.0–36.0)
MCV: 78.3 fL (ref 78.0–100.0)
Platelets: 267 10*3/uL (ref 150–400)
RBC: 4.69 MIL/uL (ref 3.87–5.11)
RDW: 14.8 % (ref 11.5–15.5)
WBC: 13.9 10*3/uL — ABNORMAL HIGH (ref 4.0–10.5)

## 2011-07-08 LAB — COMPREHENSIVE METABOLIC PANEL
ALT: 21 U/L (ref 0–35)
AST: 58 U/L — ABNORMAL HIGH (ref 0–37)
Albumin: 3.6 g/dL (ref 3.5–5.2)
Alkaline Phosphatase: 64 U/L (ref 39–117)
BUN: 15 mg/dL (ref 6–23)
CO2: 23 mEq/L (ref 19–32)
Calcium: 9.3 mg/dL (ref 8.4–10.5)
Chloride: 93 mEq/L — ABNORMAL LOW (ref 96–112)
Creatinine, Ser: 1.04 mg/dL (ref 0.50–1.10)
GFR calc Af Amer: >60 mL/min (ref >60)
GFR calc non Af Amer: 51 mL/min — ABNORMAL LOW (ref >60)
Glucose, Bld: 122 mg/dL — ABNORMAL HIGH (ref 70–99)
Potassium: 3.7 mEq/L (ref 3.5–5.1)
Sodium: 130 mEq/L — ABNORMAL LOW (ref 135–145)
Total Bilirubin: 0.3 mg/dL (ref 0.3–1.2)
Total Protein: 7 g/dL (ref 6.0–8.3)

## 2011-07-08 LAB — DIFFERENTIAL
Basophils Absolute: 0 10*3/uL (ref 0.0–0.1)
Basophils Relative: 0 % (ref 0–1)
Eosinophils Absolute: 0 10*3/uL (ref 0.0–0.7)
Eosinophils Relative: 0 % (ref 0–5)
Lymphocytes Relative: 15 % (ref 12–46)
Lymphs Abs: 2 10*3/uL (ref 0.7–4.0)
Monocytes Absolute: 1.1 10*3/uL — ABNORMAL HIGH (ref 0.1–1.0)
Monocytes Relative: 8 % (ref 3–12)
Neutro Abs: 10.7 10*3/uL — ABNORMAL HIGH (ref 1.7–7.7)
Neutrophils Relative %: 77 % (ref 43–77)

## 2011-07-08 LAB — URINE MICROSCOPIC-ADD ON

## 2011-07-08 LAB — POCT I-STAT TROPONIN I: Troponin i, poc: 0.01 ng/mL (ref 0.00–0.08)

## 2011-07-09 LAB — CBC
HCT: 35 % — ABNORMAL LOW (ref 36.0–46.0)
Hemoglobin: 11.9 g/dL — ABNORMAL LOW (ref 12.0–15.0)
MCH: 26.6 pg (ref 26.0–34.0)
MCHC: 34 g/dL (ref 30.0–36.0)
MCV: 78.3 fL (ref 78.0–100.0)
Platelets: 235 10*3/uL (ref 150–400)
RBC: 4.47 MIL/uL (ref 3.87–5.11)
RDW: 14.9 % (ref 11.5–15.5)
WBC: 9.1 10*3/uL (ref 4.0–10.5)

## 2011-07-09 LAB — BASIC METABOLIC PANEL
BUN: 12 mg/dL (ref 6–23)
CO2: 21 mEq/L (ref 19–32)
Calcium: 8.6 mg/dL (ref 8.4–10.5)
Chloride: 97 mEq/L (ref 96–112)
Creatinine, Ser: 0.92 mg/dL (ref 0.50–1.10)
GFR calc Af Amer: >60 mL/min (ref >60)
GFR calc non Af Amer: 59 mL/min — ABNORMAL LOW (ref >60)
Glucose, Bld: 126 mg/dL — ABNORMAL HIGH (ref 70–99)
Potassium: 3.4 mEq/L — ABNORMAL LOW (ref 3.5–5.1)
Sodium: 131 mEq/L — ABNORMAL LOW (ref 135–145)

## 2011-07-09 LAB — URINE CULTURE
Colony Count: NO GROWTH
Culture  Setup Time: 201209180226
Culture: NO GROWTH

## 2011-07-09 LAB — MAGNESIUM: Magnesium: 2 mg/dL (ref 1.5–2.5)

## 2011-07-10 LAB — BASIC METABOLIC PANEL
BUN: 18 mg/dL (ref 6–23)
CO2: 21 mEq/L (ref 19–32)
Calcium: 9.4 mg/dL (ref 8.4–10.5)
Chloride: 108 mEq/L (ref 96–112)
Creatinine, Ser: 0.87 mg/dL (ref 0.50–1.10)
GFR calc Af Amer: >60 mL/min (ref >60)
GFR calc non Af Amer: >60 mL/min (ref >60)
Glucose, Bld: 98 mg/dL (ref 70–99)
Potassium: 4.2 mEq/L (ref 3.5–5.1)
Sodium: 139 mEq/L (ref 135–145)

## 2011-07-11 LAB — BASIC METABOLIC PANEL
BUN: 17 mg/dL (ref 6–23)
CO2: 22 mEq/L (ref 19–32)
Calcium: 9.5 mg/dL (ref 8.4–10.5)
Chloride: 105 mEq/L (ref 96–112)
Creatinine, Ser: 0.91 mg/dL (ref 0.50–1.10)
GFR calc Af Amer: >60 mL/min (ref >60)
GFR calc non Af Amer: 60 mL/min — ABNORMAL LOW (ref >60)
Glucose, Bld: 149 mg/dL — ABNORMAL HIGH (ref 70–99)
Potassium: 3.8 mEq/L (ref 3.5–5.1)
Sodium: 138 mEq/L (ref 135–145)

## 2011-07-12 NOTE — Discharge Summary (Signed)
Wendy Andrews, Wendy Andrews NO.:  [id number]  MEDICAL RECORD NO.:  [id number]  LOCATION:  1423                         FACILITY:  [facility]  PHYSICIAN:  Brendia Sacks, MD    DATE OF BIRTH:  11-08-1929  DATE OF ADMISSION:  07/08/2011 DATE OF DISCHARGE:  07/11/2011                              DISCHARGE SUMMARY   PRIMARY CARE PHYSICIAN:  Antony Madura, M.D.  CONDITION ON DISCHARGE:  Improved.  DISPOSITION:  Home with Home Health Physical Therapy.  DISCHARGE DIAGNOSES: 1. Hypo-osmolar hyponatremia. 2. Nausea and vomiting, resolved. 3. Anxiety, stable. 4. Hypothyroidism, stable.  HISTORY OF PRESENT ILLNESS:  This is a 77 year old woman, who presented with generalized weakness, nausea, vomiting, and confusion.  The patient did endorse that she might have been taking her medication improperly.  HOSPITAL COURSE:  Wendy Andrews was admitted to the medical floor and treated with conservative measures for hyponatremia.  Her sodium corrected rapidly.  She is on several medications, which can cause this, but the most likely one in this case is felt to be hydrochlorothiazide, which has been discontinued.  Other medications that can contribute and do include Lisinopril and mirtazapine.  Dr. Marigene Ehlers admission history and physical notes that the patient is on trazodone, but as far as I can tell she is not.  Her sodium remained stable on discharge.  She can follow up in the outpatient setting.  She already has an appointment with Dr. Su Hilt next week on July 16, 2011.  At that time, could consider repeating a basic metabolic panel to reassess her sodium and discuss further compliance with medications.  At this point, it is most reasonable to stop her hydrochlorothiazide and continue her lisinopril, given her need for blood pressure control.  However, if she does develop recurrent hyponatremia, would suggest discontinuing this medication and considering an alternative  medication, mirtazapine.  Per her most recent discharge, she was on both an ACE inhibitor and hydrochlorothiazide at the end of August 2012.  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  She is stable for discharge. Her anxiety has been stable on her Xanax, which we will continue.  The patient's nausea and vomiting quickly resolved and was felt to be benign in nature.  No further evaluation recommended at this point.  CONSULTATIONS:  None.  PROCEDURES:  None.  IMAGING:  Acute abdominal series July 08, 2011, no acute cardiopulmonary findings.  No acute abdominal process.  MICROBIOLOGY:  On urine culture July 08, 2011, no growth, final.  PERTINENT LABORATORY STUDIES: 1. TSH was within normal limits June 10, 2011. 2. CBC was essentially unremarkable with a mild leukocytosis on     admission, which quickly resolved and is likely stress reaction. 3. Basic metabolic panel notable for sodium of 130 on admission and     138 on discharge and stable.  Electrolytes otherwise unremarkable.     BUN and creatinine within normal limits.  Intermittent elevated     blood sugar on fasting noted and can be followed in the outpatient     setting.  PHYSICAL EXAMINATION:  GENERAL:  On discharge, the patient is feeling well.  No complaints. VITAL SIGNS:  She is afebrile.  Temperature is 97.7, pulse 66, respirations 16, blood pressure 172/82, saturations 98% on room air. CARDIOVASCULAR:  Regular rate and rhythm.  No murmur, rub, or gallop. RESPIRATORY:  Clear to auscultation bilaterally.  No wheezes, rales, or rhonchi.  Normal respiratory effort.  Speech fluent and clear. PSYCHIATRIC:  Anxious, but oriented to herself, to Wendy Andrews to the hospital, and July 11, 2011.  DISCHARGE INSTRUCTIONS:  The patient will be discharged to home today. She was assessed by Physical  Therapy and Home Health Physical Therapy and a cane was recommended.  This has been coordinated by the case manager.  The patient already has followup with Dr. Burton Apley July 16, 2011, at 1:45 p.m.  I have recommended that she keep this appointment.  DIET:  Low-sodium heart-healthy diet.  ACTIVITY:  Increase slowly and use a cane.  The patient reports her niece can look in on her.  DISCHARGE MEDICATIONS: New: 1. Amlodipine 10 mg p.o. daily. 2. Lisinopril 10 mg p.o. daily.  Resume the following medications: 1. Correctol 2 tablets every 3 to 4 days of 5 mg each. 2. Fluticasone nasal spray 2 sprays b.i.d. each nostril. 3. Mirtazapine 15 mg p.o. daily. 4. Multivitamin p.o. daily. 5. Nexium 40 mg p.o. daily. 6. Premarin 1 tablet p.o. daily. 7. Synthroid 50 mcg p.o. daily. 8. Tramadol 50 mg p.o. b.i.d. as needed for pain. 9. Triazolam 0.25 mg p.o. daily. 10.Vitamin D3 over-the-counter p.o. daily. 11.Xanax 0.25 mg p.o. b.i.d.  Discontinue the following medications when combination lisinopril/hydrochlorothiazide, see rationale above.  Things to follow up in the outpatient setting: 1. Hyponatremia.  Consider repeat basic metabolic panel with followup     with Dr. Su Hilt. 2. The patient reports abnormal dreams and feels her sleep aid has not     been effective for her.  Could consider alternative agent, we will     defer to Dr. Su Hilt. 3. If hyponatremia recurs, consider other causes including lisinopril     or mirtazapine.  Time coordinating discharge is 37 minutes.     Brendia Sacks, MD     DG/MEDQ  D:  07/11/2011  T:  07/11/2011  Job:  [id number]  cc:   Antony Madura, M.D. Fax: [phone]  Electronically Signed by Brendia Sacks  on 07/12/2011 08:45:07 PM

## 2011-07-17 LAB — CBC
HCT: 31 — ABNORMAL LOW
HCT: 32.2 — ABNORMAL LOW
Hemoglobin: 10.6 — ABNORMAL LOW
Hemoglobin: 11.1 — ABNORMAL LOW
MCHC: 34.3
MCHC: 34.6
MCV: 78.4
MCV: 78.7
Platelets: 224
Platelets: 231
RBC: 3.94
RBC: 4.11
RDW: 15.6 — ABNORMAL HIGH
RDW: 15.9 — ABNORMAL HIGH
WBC: 11.3 — ABNORMAL HIGH
WBC: 11.4 — ABNORMAL HIGH

## 2011-07-17 LAB — DIFFERENTIAL
Basophils Absolute: 0
Basophils Relative: 0
Eosinophils Absolute: 0.1
Eosinophils Relative: 1
Lymphocytes Relative: 10 — ABNORMAL LOW
Lymphs Abs: 1.1
Monocytes Absolute: 0.9
Monocytes Relative: 8
Neutro Abs: 9.1 — ABNORMAL HIGH
Neutrophils Relative %: 80 — ABNORMAL HIGH

## 2011-07-17 LAB — URINALYSIS, ROUTINE W REFLEX MICROSCOPIC
Bilirubin Urine: NEGATIVE
Glucose, UA: NEGATIVE
Ketones, ur: NEGATIVE
Nitrite: NEGATIVE
Protein, ur: 30 — AB
Specific Gravity, Urine: 1.014
Urobilinogen, UA: 1
pH: 7

## 2011-07-17 LAB — URINE CULTURE
Colony Count: >=100000
Special Requests: NEGATIVE

## 2011-07-17 LAB — BASIC METABOLIC PANEL
BUN: 11
CO2: 21
Calcium: 8.3 — ABNORMAL LOW
Chloride: 107
Creatinine, Ser: 0.85
GFR calc Af Amer: >60
GFR calc non Af Amer: >60
Glucose, Bld: 108 — ABNORMAL HIGH
Potassium: 3.8
Sodium: 139

## 2011-07-17 LAB — CULTURE, BLOOD (ROUTINE X 2)
Culture: NO GROWTH
Culture: NO GROWTH

## 2011-07-17 LAB — URINE MICROSCOPIC-ADD ON

## 2011-07-24 NOTE — H&P (Signed)
Wendy Andrews, Wendy Andrews NO.:  [id number]  MEDICAL RECORD NO.:  [id number]  LOCATION:  [facility]                         FACILITY:  [facility]  PHYSICIAN:  Carlota Raspberry, MD         DATE OF BIRTH:  [birth year]  DATE OF ADMISSION:  06/10/2011 DATE OF DISCHARGE:                             HISTORY & PHYSICAL   PRIMARY CARE PHYSICIAN:  Antony Madura, MD  CHIEF COMPLAINT:  Abdominal pain.  HISTORY OF PRESENT ILLNESS:  This is a 77 year old female with a history of anxiety, hypothyroidism, hyperlipidemia, L4/5 fusion in 2009 and status post hysterectomy/BSO who presents with lower midline abdominal pain.  The patient was in her usual state of health until around lunchtime on June 09, 2011, when she had the sudden onset stabbing pain in her lower midline abdomen radiating into her bottom of her back.  It was initially severe and sudden and fell like a knife, but now she describes it as an aching pain.  She tried to go to sleep, but awoken around 11 midnight and the pain was still there and so she presented to the emergency room.  In the emergency room, her vital signs were stable, she was afebrile at 97.8, blood pressure was 177/72 with a pulse of 93, respirations 20, and saturating 100% on room air.  Through the emergency room, her workup has been fairly unrevealing including a CBC with a white count of 10.2 with 85% neutrophils, hematocrit of 38.9, and platelets 241.  Her chemistry panel was unremarkable.  LFTs and lipase were unremarkable.  She had a UA with a very small amount of hemoglobin and 3-6 rbc's, but no signs of infection and a CT of abdomen and pelvis was done with contrast which showed the bladder that was distended, some cholelithiasis, and some degenerative changes status post L4/5 fusion.  In the ED, she has been given 1 liter of normal saline, some morphine, some fentanyl, some Zofran with minimal amount of relief.  She is being admitted for persistent  abdominal pain and a very minimally elevated white blood cell count.  On interview with the patient, she states that her last bowel movement was today and she did have to strain a bit, but the bowel movement was soft.  She does take Correctol to try to make sure that she stays regular as constipation has been an issue for her, but before today's bowel movement she had a bowel movement yesterday as well too.  She noted no blood in her bowel movement.  Regarding urination, she has been peeing okay and reports no dysuria.  She reports that after the CT scan that showed a distended bladder she urinated quite a large amount.  She reports no vaginal discharge and is status post hysterectomy and with no menstrual periods.  However, of note, she does report that she in her distant history used to have cystitis, but this feels different from that, in that previously her symptoms were a feeling of urgency whereas now she feels no urgency or dysuria.  REVIEW OF SYSTEMS:  As above, otherwise she does endorse some chills and sweating, but has been afebrile through her course in the emergency  room.  She reports chronic fatigue that is at her baseline.  She does report that occasionally she will have a little bit of pain in her jaw, but has not mentioned this to her primary care doctor, but does mention that her family has had heart problems.  Otherwise, review of systems is negative for significant weight changes, HEENT problems, chest pain, shortness of breath, nausea, vomiting, GERD, epigastric pain or pain in her bilateral upper quadrants, focal neurological changes, rash or skin changes.  Review of systems is otherwise negative.  PAST MEDICAL HISTORY:  Significant for: 1. An L4/5 fusion about 3 years ago for significant spinal stenosis. 2. GERD. 3. Hyperlipidemia. 4. Hypothyroidism. 5. Anxiety. 6. Status post hysterectomy/BSO about greater than 40 years ago for     what the patient  describes as some type of masses in her uterus,     likely fibroids that prevented her from being able to get pregnant.  MEDICATION LIST:  It was unable to be fully reconciled at this time with the patient as she did not know, but was able to say that she takes Nexium, Xanax, and tramadol and a "blood pressure med."  However, looking at her last discharge summary from October 2011, it looks like she was supposed to be on multivitamins, thiamine, aspirin 81, Lipitor 10, Nexium 40, Premarin, Synthroid 50 mcg daily, and vitamin D3.  ALLERGIES: 1. PENICILLIN. 2. TETANUS TOXOID.  SOCIAL HISTORY:  She lives by herself and has been divorced a couple of times.  She has no children.  She has not been sexually active for quite a long time.  She has a never smoker and quit drinking about 15 years ago.  FAMILY HISTORY:  Significant for a father with an AMI and mother with coronary artery disease and her brother had lung cancer.  PHYSICAL EXAMINATION:  VITAL SIGNS:  She is afebrile.  Her blood pressure was 174/74 ranging in the 170s during evaluation.  Pulses 74, 99% on room air, and respirations 12. GENERAL:  She is a large lady, lying in stretcher, in no distress.  She is pleasant and does not appear ill.  She is a fairly decent historian. HEENT:  Pupils are equal, round, and reactive to light and accommodation.  Her sclerae are clear and anicteric.  Her mouth is moist and normal appearing.  There are no oropharyngeal lesions.  Her cheeks are flushed, but she does not appear diaphoretic or febrile. LUNGS:  Clear to auscultation bilaterally.  No wheezes, crackles, or rales. HEART:  Regular rate and rhythm.  No murmurs or gallops. ABDOMEN:  Obese and she is guarding quite a bit, but when you divert her attention she does become a bit more soft in her abdomen.  There are certainly no peritoneal signs.  There is no tenderness at all in her bilateral upper quadrants and there is no  hepatosplenomegaly in her upper quadrants.  In the bilateral lower quadrants, I was not able to palpate her bladder and pretty deep palpation along her midline and the bilateral lower quadrants elicits no grimacing or other signs that she is in pain.  Overall, it is very unimpressive and benign abdominal exam. Her bowel sounds are positive. EXTREMITIES:  Her bilateral lower extremities are without edema and her extremities were all warm and well perfused.  Her bilateral radial pulses are easily palpable. SKIN:  There are no rashes grossly. NEUROLOGICAL:  She is alert, oriented, conversant, and pleasant.  She is spontaneously moving all 4 extremities.  She is able to lift up both of her legs off the hospital stretcher and keeping them there for several seconds.  She has gross sensation intact in her lower extremities as well.  LABORATORY DATA:  Her chemistry is completely unremarkable including a BUN and creatinine of 19 and 0.7.  Her LFTs and lipase were normal.  Her chemistry shows a white count of 10.2 with 85% neutrophils, hematocrit of 38.9, and platelets 241.  Her UA shows small hemoglobin with rbc's 3- 6, but no signs of infection.  Her CT of abdomen and pelvis with contrast shows: 1. No acute abnormalities identified within the abdomen or pelvis. 2. Cholelithiasis.  Gallbladder otherwise unremarkable. 3. Degenerative changes in her L-spine, status post L4/L5 spinal     fusion and the read also notes significantly distended bladder.  ASSESSMENT AND PLAN:  This is a 77 year old female with a history of hypothyroidism, L4/5 fusion 3 years ago, status post hysterectomy and BSO and a (?)history of cystitis who presents with sudden onset midline lower abdominal pain with a thus far unrevealing workup.  I think the differential diagnosis includes a benign viral-type, gastroenteritis-type picture versus a constipation.  There is certainly nothing jumping out from the abdominal  imaging thus far.  Her self- reported history of cystitis long ago does raise this as a potential etiology and she does have 3-6 rbc's in her urine.  She may have an interstitial cystitis-type picture, however, she reports her current symptoms do not concur with her previous symptoms of frequent urgency and she is also currently denying dysuria.  Therefore, at this point, I think the best course will be to admit her and just observe her and do serial abdominal exams, follow her CBC and chemistry, and repeat a UA in the morning to make sure that the hematuria is not getting worse.  If it is, it may be worth to consult Urology for cystoscopy given her self- reported history.  Otherwise, the patient is stable other than some hypertension with systolics in the 170s through her ED course.  She does say that she takes her blood pressure medicine and once her medications are better reconciled we can restart her on these.  Regarding FEN, she is status post 1 liter of normal saline.  We will hold off on giving any further fluids at this point.  She has a peripheral IV in place.  We have discussed code status and she is a full code.  The patient will be admitted to [facility].          ______________________________ Carlota Raspberry, MD     EB/MEDQ  D:  06/10/2011  T:  06/10/2011  Job:  [id number]  Electronically Signed by Carlota Raspberry MD on 07/24/2011 12:02:42 PM

## 2011-07-24 NOTE — H&P (Signed)
  NAMEDEDEE, LISS NO.:  [id number]  MEDICAL RECORD NO.:  [id number]  LOCATION:  [facility]                         FACILITY:  [facility]  PHYSICIAN:  Carlota Raspberry, MD         DATE OF BIRTH:  [birth year]  DATE OF ADMISSION:  06/09/2011 DATE OF DISCHARGE:                             HISTORY & PHYSICAL   ADDENDUM: 1. The patient was Hemoccult negative. 2. EKG was sinus rhythm and is unremarkable except for what appear to     be Q-waves in V1 and V2.  Given that the patient during our     conversation, noted occasional jaw pain that could come on with     exertion or with rest.  I did suggest that she mentioned this to     her primary care doctor, however, she states that she has not had     this for quite a long time and I think that this is very likely an     incidental finding and not related to her acute presentation of     lower midline abdominal pain, however, I did advise the patient to     talk to her primary care doctor about this and we will continue her     on her what appears to be her home 81 mg of aspirin.          ______________________________ Carlota Raspberry, MD     EB/MEDQ  D:  06/10/2011  T:  06/10/2011  Job:  [id number]  Electronically Signed by Carlota Raspberry MD on 07/24/2011 12:02:39 PM

## 2011-08-04 NOTE — H&P (Signed)
NAMEEMERSYN, WYSS NO.:  [id number]  MEDICAL RECORD NO.:  [id number]  LOCATION:  [facility]                         FACILITY:  [facility]  PHYSICIAN:  Houston Siren, MD           DATE OF BIRTH:  10/29/1929  DATE OF ADMISSION:  07/08/2011 DATE OF DISCHARGE:                             HISTORY & PHYSICAL   PRIMARY CARE PHYSICIAN:  Antony Madura, M.D.  REASON FOR ADMISSION:  Altered mental status, weakness, and persistent nausea and vomiting.  HISTORY OF PRESENT ILLNESS:  This is a 77 year old female with history of anxiety, lives alone, history of hypothyroidism, hyperlipidemia, status post hysterectomy with bilateral salpingo-oophorectomy, recently admitted for abdominal pain and constipation, resolved without much intervention, returned because of persistent nausea and vomiting.  Her family also stated she has increased confusion.  She lives alone and they were unsure if she had messed up in her medication.  She told me that she was taking 2 medicines at night to sleep even though she was prescribed 1, and she might have taken more Ultrams.  She denies any chest pain, shortness of breath, abdominal cramps or pain or diarrhea.  She has no black stool or bloody stool.  She denied any headache, slurred speech, or any focal weakness.  She has no distant travel nor any ill contacts.  Evaluation in the emergency room showed a serum sodium of 130; when she was here a month ago, it was 137.  EKG shows sinus rhythm at 98 with no acute ST-T changes.  She has a [place] of 16[address] and hemoglobin of 12.6 with MCV of 78.  Her potassium is 3.7, creatinine is 1.04.  Abdominal film was unremarkable as well.  Hospitalist was asked to admit the patient because of increased confusion, hyponatremia, and persistent nausea and vomiting.  PAST MEDICAL HISTORY: 1. Anxiety. 2. Depression. 3. High cholesterol. 4. Hypothyroidism. 5. Hyperlipidemia.  ALLERGY: 1. PENICILLIN. 2.  TETANUS SHOT.  CURRENT MEDICATIONS:  Premarin, trazodone, Xanax, Synthroid, vitamin D, Lipitor, lisinopril and HCTZ, fluticasone, mirtazapine, and Nexium.  FAMILY HISTORY:  Noncontributory.  REVIEW OF SYSTEMS:  Otherwise unremarkable.  PHYSICAL EXAMINATION:  VITAL SIGNS:  Blood pressure 150/70, pulse 88, respiratory rate 26, temperature 98.5. GENERAL:  Shows she is alert and oriented and conversing with fluent speech.  She does have slight confusion. HEENT:  Tongue is midline.  Uvula elevated with phonation.  Pupils small, equal, round, and reactive. NECK:  Supple.  No lymphadenopathy or thyromegaly. CARDIAC:  Reveals S1 and S2 regular. LUNGS:  Clear. ABDOMEN:  Soft, nondistended, nontender.  No hepatosplenomegaly.  Bowel sounds present. EXTREMITIES:  Show no edema.  No calf tenderness.  Strength is good bilaterally. PSYCHIATRIC:  She appeared to be anxious to me.  LABORATORY STUDY:  As above, but pertinent finding was serum sodium is 130, potassium of 3.7, creatinine normal.  Liver function tests with SGOT of 58.  [place] of 60454.  IMPRESSION AND PLAN:  This is a 77 year old female who lives alone, presents with slight volume depletion, but with serum sodium of 130 and I suspect she might have mild syndrome of inappropriate secretion of antidiuretic hormone from nausea (nausea is  a potent stimulus for the release of ADH).  I believe that she might have taken her medication incorrectly, and her medication does have significant side effect.  She hasn't had any GI workup, and if sympthoms do not resolve quickly, she would need a complete GI workup, including upper and lower endoscopies as well.  We will admit her to Telemetry.  We will give her antiemetics and start her on normal saline infusion.  I will stop her HCTZ.  With respect to her sedative medications, we will stop her Ultram, trazodone, and Xanax.  If her blood pressure is elevated, we will give her lisinopril  alone.  She is otherwise stable and will be admitted to Telemetry under [facility] #3.  She is a full code.     Houston Siren, MD     PL/MEDQ  D:  07/08/2011  T:  07/08/2011  Job:  [id number]  Electronically Signed by Houston Siren  on 08/04/2011 02:37:43 PM

## 2011-08-07 IMAGING — CT CT L SPINE W/ CM
4 of 10 series · 11 of 33 positions shown, 12 images · IV contrast (omnipaque)
Comparison: MRI 09/06/2008

CLINICAL DATA: Prior surgery.  Spondylosis.

MYELOGRAM LUMBAR
TECHNIQUE: Lumbar puncture was performed by Dr. Fortino. Following
injection of intrathecal Omnipaque contrast, spine imaging in
multiple projections was performed using fluoroscopy.
Fluoroscopy Time: 56 seconds .
TECHNIQUE: CT imaging of the lumbar spine was performed after
intrathecal contrast administration.  Multiplanar CT image
reconstructions were also generated.

[Series 2: l-spine · axial · 0.27mm/px · z∈[-207,-97]mm · 3 of 89 slices shown, 4 images]
[im 23/89  soft-tissue]
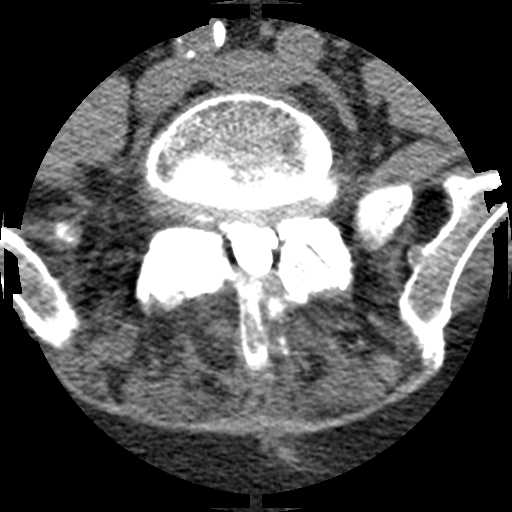
[im 23/89  bone]
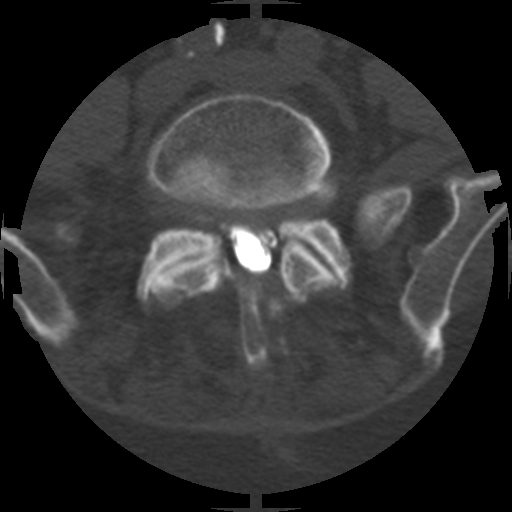
[im 45/89  bone]
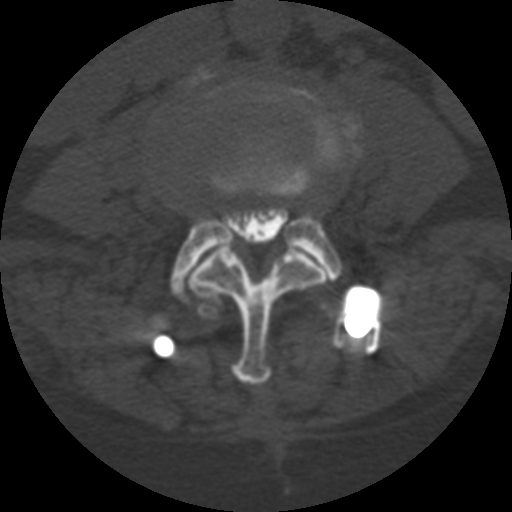
[im 67/89  bone]
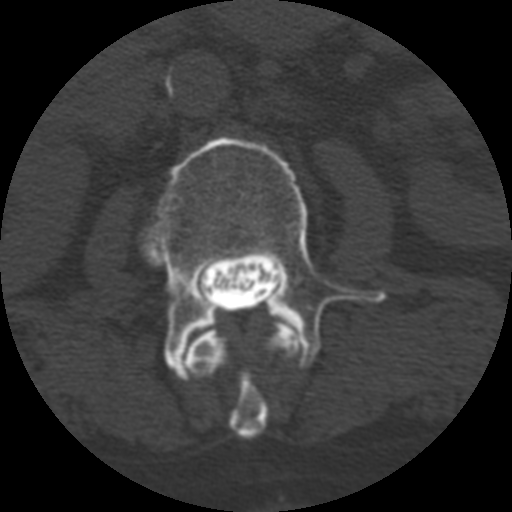

[Series 3: recon 2: l-spine · axial · 0.27mm/px · z∈[-189,-117]mm · 2 of 89 slices shown]
[im 30/89  bone]
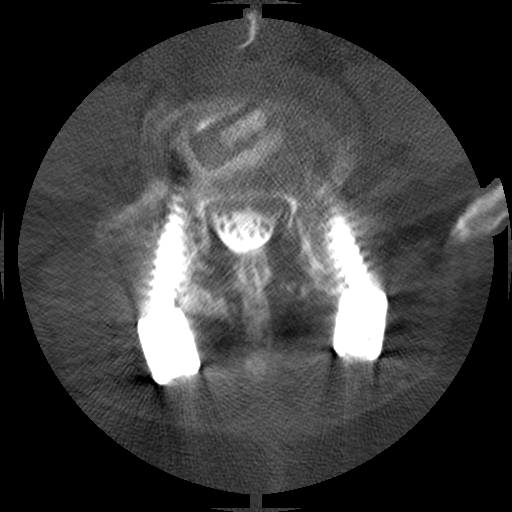
[im 59/89  bone]
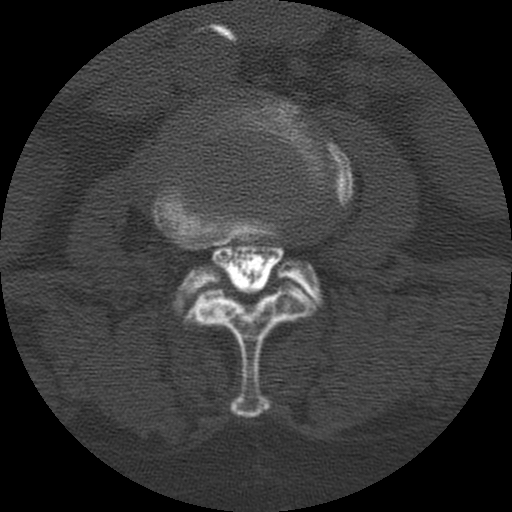

[Series 103: sag detail · sagittal · 0.44mm/px · 5 of 65 slices shown]
[im 11/65  bone]
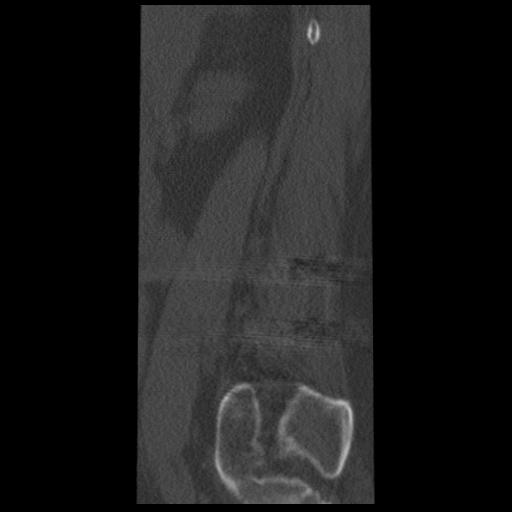
[im 22/65  bone]
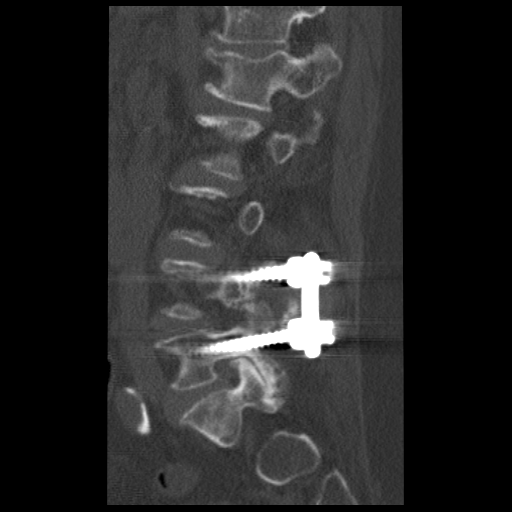
[im 33/65  bone]
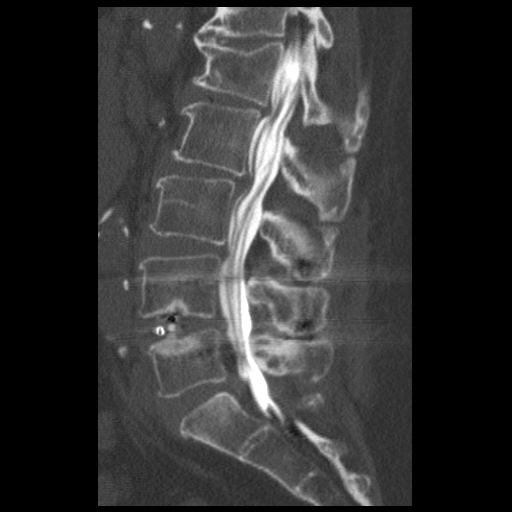
[im 43/65  bone]
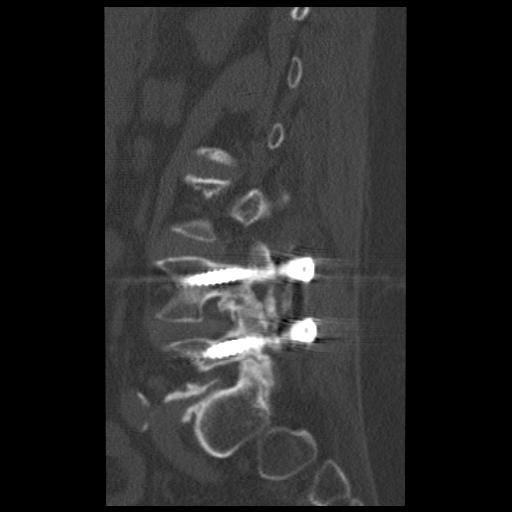
[im 54/65  bone]
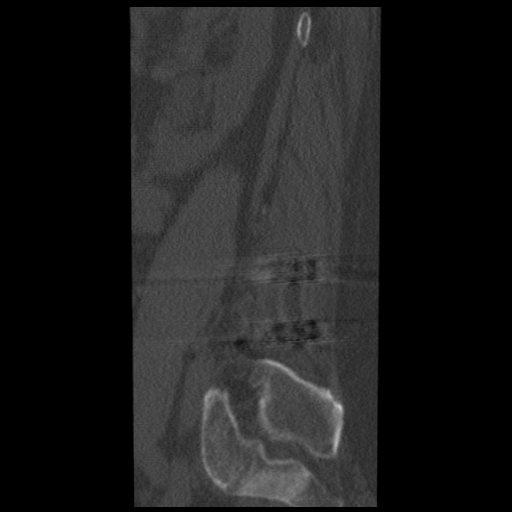

[Series 104: cor detail · coronal · 0.44mm/px · 1 of 60 slices shown]
[im 30/60  bone]
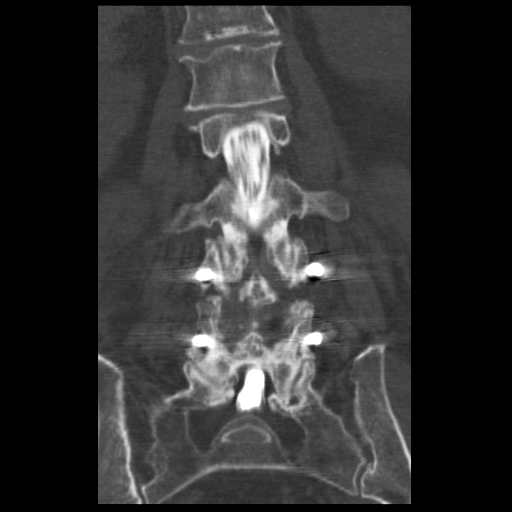

[11 of 33 positions shown; findings below may reference images not displayed]

FINDINGS: There are small and there is a dural defects at L1-2, L2-
3 and L3-4.  There is retrolisthesis at L2-3 of 2 mm.  I attempted
to perform standing flexion and extension views but the patient was
unstable.  Flexion view did not appear to show any change in
position or alignment.

There is lateral recess narrowing bilaterally at L2-3 of a mild
degree.  There is minimal lateral recess narrowing at L3-4, right
more than left.  The fusion level of L4-5 has a good appearance
with wide patency and no apparent screw loosening.  L5-S1 shows
narrowing of the lateral recesses, more on the right than the left.
IMPRESSION: Fusion level of L4-5 has a good appearance of myelography.

Anterior extradural defects at L1-2, L2-3 and L3-4.  Retrolisthesis
of 2 mm of L2-3.  Narrowing of the lateral recesses at L2-3 of a
mild degree.  Mild lateral recess narrowing on the right L3-4.
Bilateral lateral recess narrowing at L5-S1, right worse than left.

CT MYELOGRAPHY LUMBAR SPINE
FINDINGS: T12-L1:  Unremarkable.  Bridging osteophytes
anterolaterally on the left.

L1-2:  Mild bulging of the disc.  Mild facet degeneration.  No
compressive stenosis.

L2-3:  Moderate bulging of the disc, slightly more towards the
right.  Mild facet and ligamentous prominence.  Mild narrowing of
the lateral recesses, slightly more on the right.  No definite
neural compression.

L3-4:  Mild disc bulge.  Mild facet and ligamentous prominence.
Mild narrowing of the lateral recesses, more on the right.  No
definite neural compression.

L4-5:  There is been previous posterior decompression and fusion.
Interbody fusion material appears well positioned and there is
probably solid incorporation.  No peri screw lucency.  The canal
and foramina appear sufficiently patent.

L5-S1:  There is been previous partial hemilaminectomy on the left.
The disc shows circumferential protrusion, more towards the left.
There is facet degeneration bilaterally, right worse than left.
There is foraminal stenosis bilaterally, worse on the left than the
right, due to osteophyte and protruding disc material.
IMPRESSION: Satisfactory appearance of the fusion level of L4-5.

L5-S1:  Previous partial left hemilaminectomy.  Circumferential
protrusion of disc material more towards the left.  Facet
degeneration right worse than left.  Neural foraminal stenosis
could compress either L5 nerve root, more likely the left because
of asymmetrically more prominent osteophyte and disc material on
that side.

Disc bulges at L1-2, L2-3 and L3-4 with mild narrowing of the
lateral recesses, right more than left, but no apparent compressive
stenosis.

## 2011-08-07 IMAGING — RF DG MYELOGRAM LUMBAR
12 series · 12 of 12 positions shown · IV contrast (omnipaque)
Comparison: MRI 09/06/2008

CLINICAL DATA: Prior surgery.  Spondylosis.

MYELOGRAM LUMBAR
TECHNIQUE: Lumbar puncture was performed by Dr. Fortino. Following
injection of intrathecal Omnipaque contrast, spine imaging in
multiple projections was performed using fluoroscopy.
Fluoroscopy Time: 56 seconds .
TECHNIQUE: CT imaging of the lumbar spine was performed after
intrathecal contrast administration.  Multiplanar CT image
reconstructions were also generated.

[Series 1: run · 1 of 1 slices shown (1 of 12)]
[im 1/1]
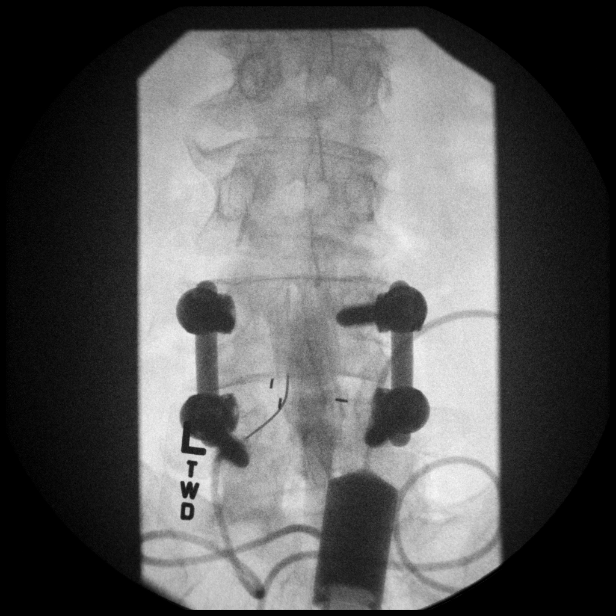

[Series 2: run · 1 of 1 slices shown (2 of 12)]
[im 1/1]
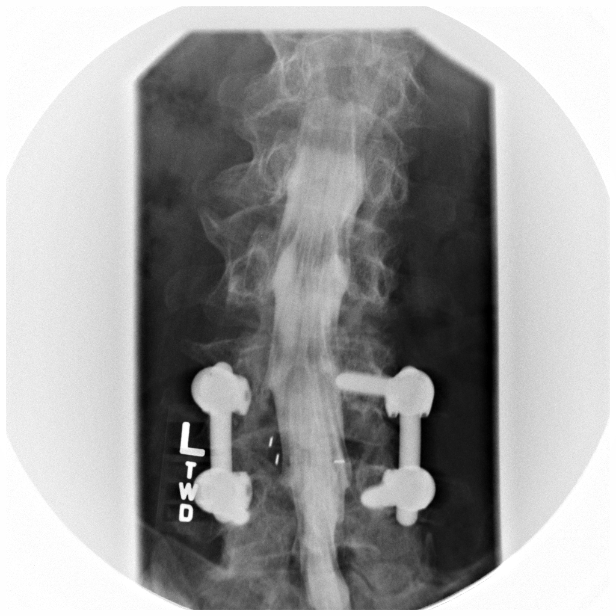

[Series 3: run · 1 of 1 slices shown (3 of 12)]
[im 1/1]
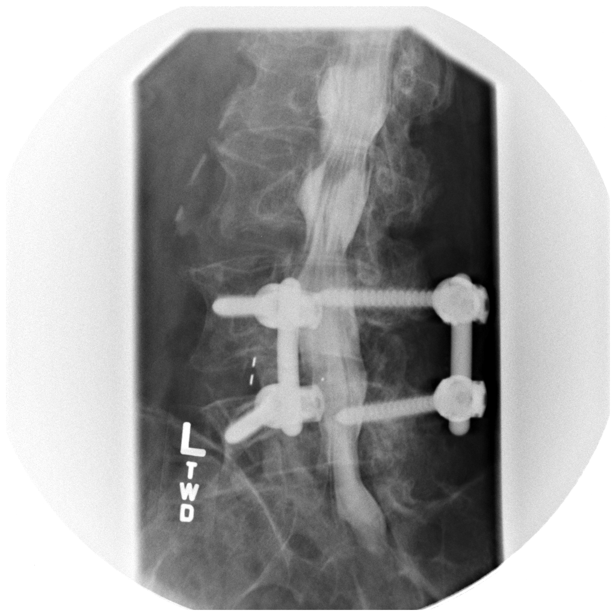

[Series 4: run · 1 of 1 slices shown (4 of 12)]
[im 1/1]
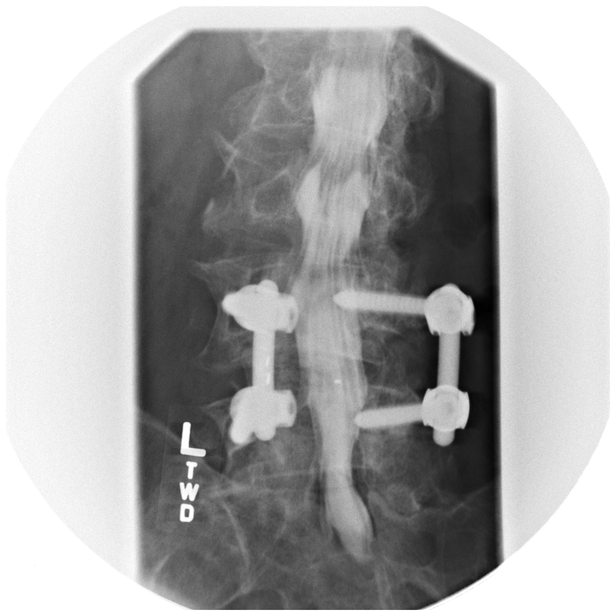

[Series 5: run · 1 of 1 slices shown (5 of 12)]
[im 1/1]
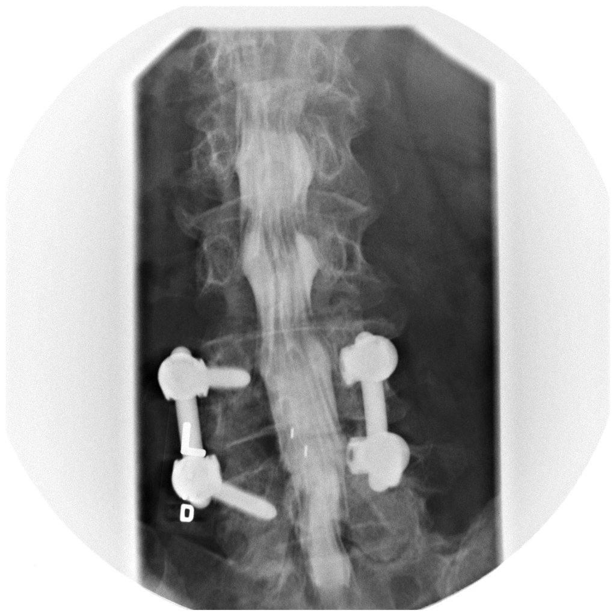

[Series 6: run · 1 of 1 slices shown (6 of 12)]
[im 1/1]
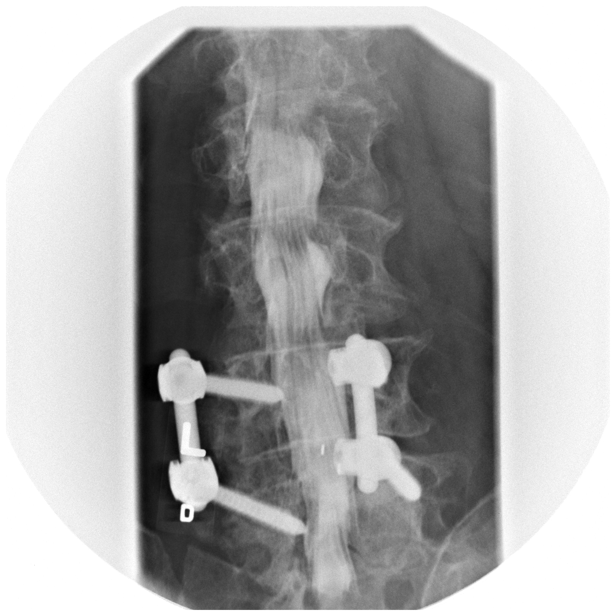

[Series 7: run · 1 of 1 slices shown (7 of 12)]
[im 1/1]
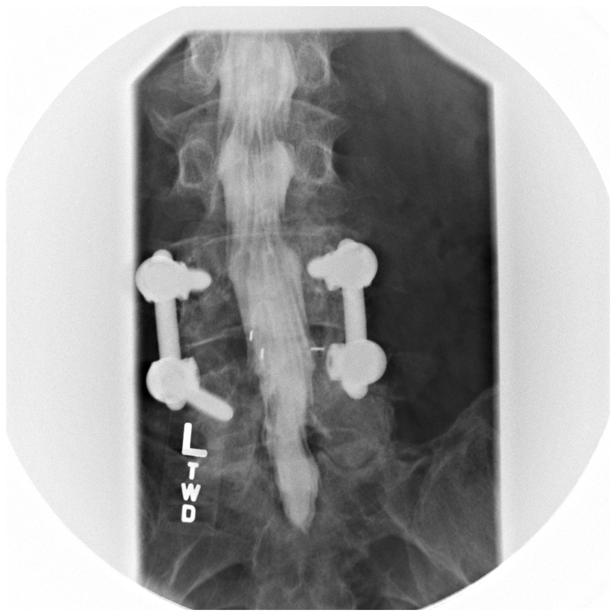

[Series 8: run · 1 of 1 slices shown (8 of 12)]
[im 1/1]
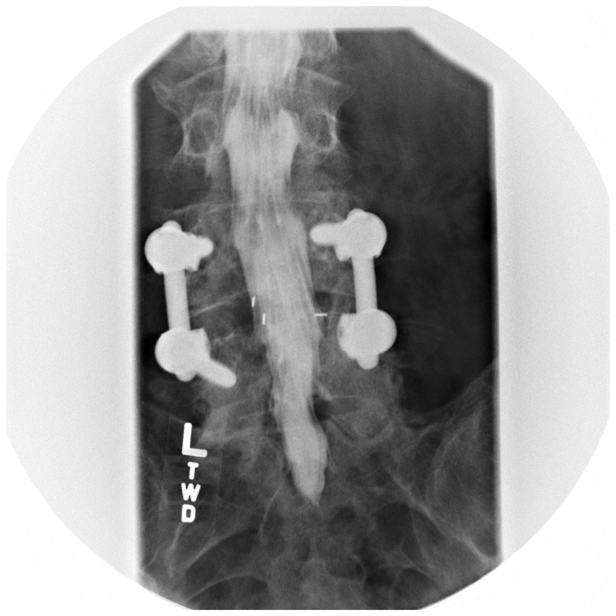

[Series 9: run · 1 of 1 slices shown (9 of 12)]
[im 1/1]
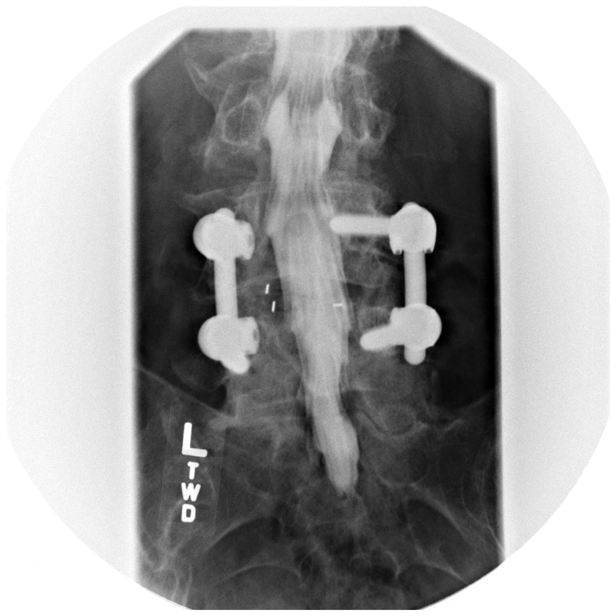

[Series 10: run · 1 of 1 slices shown (10 of 12)]
[im 1/1]
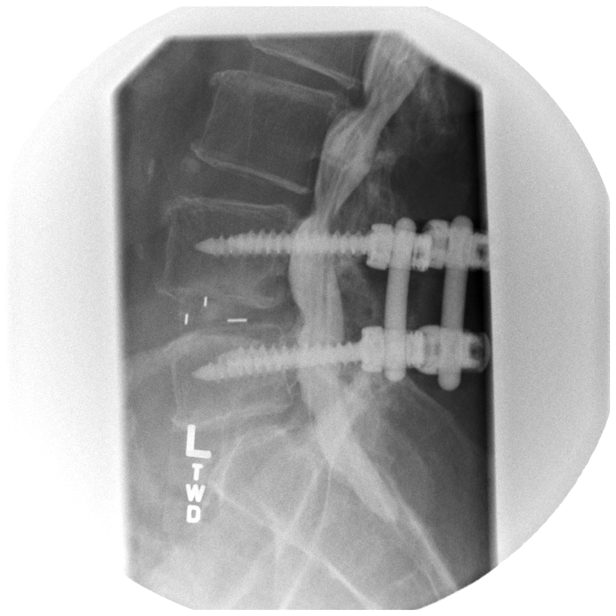

[Series 11: run · 1 of 1 slices shown (11 of 12)]
[im 1/1]
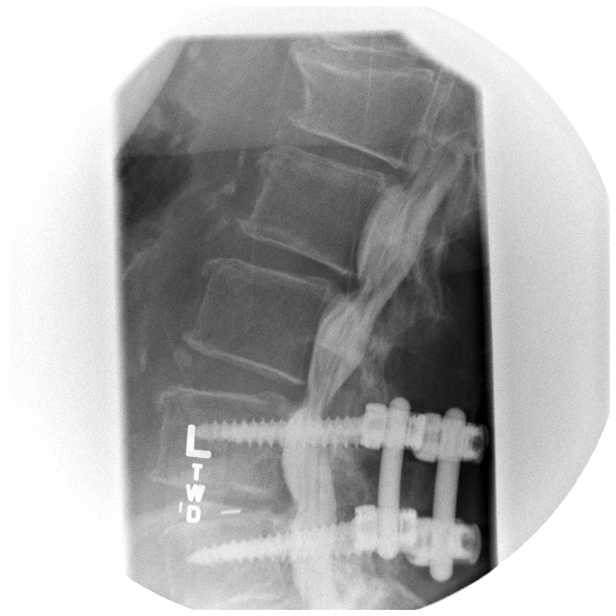

[Series 12: run · 1 of 1 slices shown (12 of 12)]
[im 1/1]
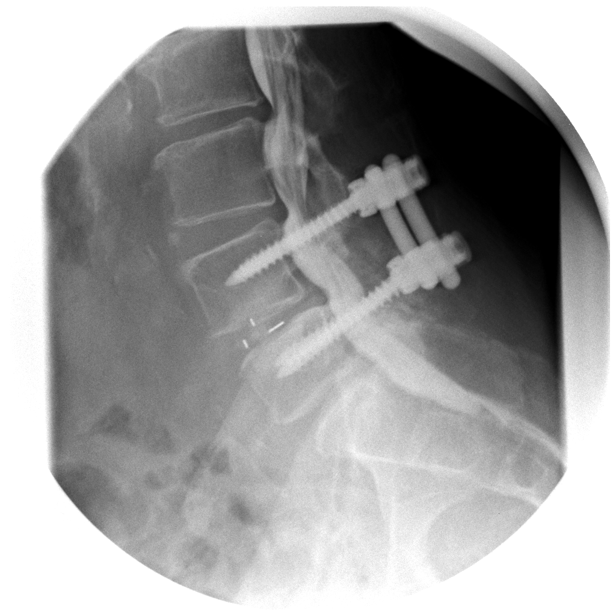

[12 of 12 positions shown; findings below may reference images not displayed]

FINDINGS: There are small and there is a dural defects at L1-2, L2-
3 and L3-4.  There is retrolisthesis at L2-3 of 2 mm.  I attempted
to perform standing flexion and extension views but the patient was
unstable.  Flexion view did not appear to show any change in
position or alignment.

There is lateral recess narrowing bilaterally at L2-3 of a mild
degree.  There is minimal lateral recess narrowing at L3-4, right
more than left.  The fusion level of L4-5 has a good appearance
with wide patency and no apparent screw loosening.  L5-S1 shows
narrowing of the lateral recesses, more on the right than the left.
IMPRESSION: Fusion level of L4-5 has a good appearance of myelography.

Anterior extradural defects at L1-2, L2-3 and L3-4.  Retrolisthesis
of 2 mm of L2-3.  Narrowing of the lateral recesses at L2-3 of a
mild degree.  Mild lateral recess narrowing on the right L3-4.
Bilateral lateral recess narrowing at L5-S1, right worse than left.

CT MYELOGRAPHY LUMBAR SPINE
FINDINGS: T12-L1:  Unremarkable.  Bridging osteophytes
anterolaterally on the left.

L1-2:  Mild bulging of the disc.  Mild facet degeneration.  No
compressive stenosis.

L2-3:  Moderate bulging of the disc, slightly more towards the
right.  Mild facet and ligamentous prominence.  Mild narrowing of
the lateral recesses, slightly more on the right.  No definite
neural compression.

L3-4:  Mild disc bulge.  Mild facet and ligamentous prominence.
Mild narrowing of the lateral recesses, more on the right.  No
definite neural compression.

L4-5:  There is been previous posterior decompression and fusion.
Interbody fusion material appears well positioned and there is
probably solid incorporation.  No peri screw lucency.  The canal
and foramina appear sufficiently patent.

L5-S1:  There is been previous partial hemilaminectomy on the left.
The disc shows circumferential protrusion, more towards the left.
There is facet degeneration bilaterally, right worse than left.
There is foraminal stenosis bilaterally, worse on the left than the
right, due to osteophyte and protruding disc material.
IMPRESSION: Satisfactory appearance of the fusion level of L4-5.

L5-S1:  Previous partial left hemilaminectomy.  Circumferential
protrusion of disc material more towards the left.  Facet
degeneration right worse than left.  Neural foraminal stenosis
could compress either L5 nerve root, more likely the left because
of asymmetrically more prominent osteophyte and disc material on
that side.

Disc bulges at L1-2, L2-3 and L3-4 with mild narrowing of the
lateral recesses, right more than left, but no apparent compressive
stenosis.

## 2011-11-20 ENCOUNTER — Emergency Department (HOSPITAL_COMMUNITY)
Admission: EM | Admit: 2011-11-20 | Discharge: 2011-11-20 | Disposition: A | Discharge status: Home / Self Care | Payer: Medicare Other | Attending: Emergency Medicine | Admitting: Emergency Medicine

## 2011-11-20 ENCOUNTER — Encounter (HOSPITAL_COMMUNITY): Payer: Self-pay | Admitting: Emergency Medicine

## 2011-11-20 DIAGNOSIS — M549 Dorsalgia, unspecified: Secondary | ICD-10-CM | POA: Insufficient documentation

## 2011-11-20 DIAGNOSIS — N390 Urinary tract infection, site not specified: Secondary | ICD-10-CM | POA: Insufficient documentation

## 2011-11-20 DIAGNOSIS — I10 Essential (primary) hypertension: Secondary | ICD-10-CM | POA: Insufficient documentation

## 2011-11-20 DIAGNOSIS — R109 Unspecified abdominal pain: Secondary | ICD-10-CM | POA: Insufficient documentation

## 2011-11-20 DIAGNOSIS — R350 Frequency of micturition: Secondary | ICD-10-CM | POA: Insufficient documentation

## 2011-11-20 DIAGNOSIS — R3915 Urgency of urination: Secondary | ICD-10-CM | POA: Insufficient documentation

## 2011-11-20 HISTORY — DX: Interstitial cystitis (chronic) without hematuria: N30.10

## 2011-11-20 LAB — CBC
HCT: 38 % (ref 36.0–46.0)
Hemoglobin: 12.8 g/dL (ref 12.0–15.0)
MCH: 26.6 pg (ref 26.0–34.0)
MCHC: 33.7 g/dL (ref 30.0–36.0)
MCV: 79 fL (ref 78.0–100.0)
Platelets: 273 10*3/uL (ref 150–400)
RBC: 4.81 MIL/uL (ref 3.87–5.11)
RDW: 14.7 % (ref 11.5–15.5)
WBC: 10.7 10*3/uL — ABNORMAL HIGH (ref 4.0–10.5)

## 2011-11-20 LAB — BASIC METABOLIC PANEL
BUN: 23 mg/dL (ref 6–23)
CO2: 20 mEq/L (ref 19–32)
Calcium: 9.2 mg/dL (ref 8.4–10.5)
Chloride: 101 mEq/L (ref 96–112)
Creatinine, Ser: 0.83 mg/dL (ref 0.50–1.10)
GFR calc Af Amer: 76 mL/min — ABNORMAL LOW (ref >90)
GFR calc non Af Amer: 65 mL/min — ABNORMAL LOW (ref >90)
Glucose, Bld: 129 mg/dL — ABNORMAL HIGH (ref 70–99)
Potassium: 4.2 mEq/L (ref 3.5–5.1)
Sodium: 134 mEq/L — ABNORMAL LOW (ref 135–145)

## 2011-11-20 LAB — URINE MICROSCOPIC-ADD ON

## 2011-11-20 LAB — URINALYSIS, ROUTINE W REFLEX MICROSCOPIC
Bilirubin Urine: NEGATIVE
Glucose, UA: NEGATIVE mg/dL
Ketones, ur: NEGATIVE mg/dL
Nitrite: POSITIVE — AB
Protein, ur: NEGATIVE mg/dL
Specific Gravity, Urine: 1.015 (ref 1.005–1.030)
Urobilinogen, UA: 0.2 mg/dL (ref 0.0–1.0)
pH: 6 (ref 5.0–8.0)

## 2011-11-20 MED ORDER — SODIUM CHLORIDE 0.9 % IV BOLUS (SEPSIS)
500.0000 mL | Freq: Once | INTRAVENOUS | Status: AC
  Administered 2011-11-20: 1000 mL via INTRAVENOUS

## 2011-11-20 MED ORDER — PHENAZOPYRIDINE HCL 200 MG PO TABS
200.0000 mg | ORAL_TABLET | Freq: Once | ORAL | Status: AC
  Administered 2011-11-20: 200 mg via ORAL
  Filled 2011-11-20: qty 1

## 2011-11-20 MED ORDER — CEPHALEXIN 500 MG PO CAPS: 500.0000 mg | ORAL_CAPSULE | Freq: Four times a day (QID) | ORAL | Status: AC

## 2011-11-20 MED ORDER — MORPHINE SULFATE 4 MG/ML IJ SOLN
4.0000 mg | Freq: Once | INTRAMUSCULAR | Status: AC
  Administered 2011-11-20: 4 mg via INTRAVENOUS
  Filled 2011-11-20: qty 1

## 2011-11-20 MED ORDER — LORAZEPAM 2 MG/ML IJ SOLN
1.0000 mg | Freq: Once | INTRAMUSCULAR | Status: AC
  Administered 2011-11-20: 1 mg via INTRAVENOUS
  Filled 2011-11-20: qty 1

## 2011-11-20 MED ORDER — PHENAZOPYRIDINE HCL 200 MG PO TABS: 200.0000 mg | ORAL_TABLET | Freq: Three times a day (TID) | ORAL | Status: AC

## 2011-11-20 MED ORDER — HYDROCODONE-ACETAMINOPHEN 5-500 MG PO TABS: 1.0000 | ORAL_TABLET | Freq: Four times a day (QID) | ORAL | Status: AC | PRN

## 2011-11-20 MED ORDER — MORPHINE SULFATE 2 MG/ML IJ SOLN
2.0000 mg | Freq: Once | INTRAMUSCULAR | Status: AC
  Administered 2011-11-20: 2 mg via INTRAVENOUS
  Filled 2011-11-20: qty 1

## 2011-11-20 MED ORDER — DEXTROSE 5 % IV SOLN
1.0000 g | INTRAVENOUS | Status: DC
  Administered 2011-11-20: 1 g via INTRAVENOUS
  Filled 2011-11-20: qty 10

## 2011-11-20 MED ORDER — ONDANSETRON HCL 4 MG/2ML IJ SOLN
4.0000 mg | Freq: Once | INTRAMUSCULAR | Status: AC
  Administered 2011-11-20: 4 mg via INTRAVENOUS
  Filled 2011-11-20: qty 2

## 2011-11-20 NOTE — ED Notes (Signed)
[id number]<AV> Expected date:11/20/11<BR> Expected time: 7:11 AM<BR> Means of arrival:Ambulance<BR> Comments:<BR> Urinary symptoms

## 2011-11-20 NOTE — ED Notes (Signed)
Patient changed into gown. Belongings placed in belongings bag. Red socks on patient.

## 2011-11-20 NOTE — ED Provider Notes (Addendum)
History     CSN: [id number]  Arrival date & time 11/20/11  4098   First MD Initiated Contact with Patient 11/20/11 [phone]      Chief Complaint  Patient presents with  . Urinary Tract Infection  . Urinary Frequency    (Consider location/radiation/quality/duration/timing/severity/associated sxs/prior treatment) Patient is a 78 y.o. female presenting with urinary tract infection and frequency. The history is provided by the patient.  Urinary Tract Infection Pertinent negatives include no chest pain, no abdominal pain, no headaches and no shortness of breath.  Urinary Frequency Pertinent negatives include no chest pain, no abdominal pain, no headaches and no shortness of breath.  pt c/o lower abd pain for past day, constant, dull, radiates to back, no exacerbating or alleviating factors, similar to prior pain from interstitial cystitis. Also notes urinary urgency and frequency. Single episodes nv early this am, states due to pain, no bloody or bilious emesis. Had normal bm today. No fever or chills. No vaginal discharge or bleeding. Prior abd surgery includes hysterectomy.   Past Medical History  Diagnosis Date  . Hypertension   . Interstitial cystitis     Past Surgical History  Procedure Date  . Abdominal hysterectomy     Family History  Problem Relation Age of Onset  . Heart failure Mother   . Heart failure Father   . Cancer Sister   . Cancer Brother     History  Substance Use Topics  . Smoking status: Never Smoker   . Smokeless tobacco: Not on file  . Alcohol Use: No    OB History    Grav Para Term Preterm Abortions TAB SAB Ect Mult Living                  Review of Systems  Constitutional: Negative for fever and chills.  HENT: Negative for neck pain.   Eyes: Negative for redness.  Respiratory: Negative for cough and shortness of breath.   Cardiovascular: Negative for chest pain.  Gastrointestinal: Negative for abdominal pain.  Genitourinary: Positive for  frequency. Negative for flank pain.  Musculoskeletal: Negative for arthralgias.  Skin: Negative for rash.  Neurological: Negative for headaches.  Hematological: Does not bruise/bleed easily.  Psychiatric/Behavioral: Negative for confusion.    Allergies  Penicillins and Tetanus toxoids  Home Medications  No current outpatient prescriptions on file.  BP 168/77  Pulse 91  Temp(Src) 98.2 F (36.8 C) (Oral)  Resp 16  Ht 5\' 4"  (1.626 m)  Wt 187 lb (84.823 kg)  BMI 32.10 kg/m2  SpO2 99%  Physical Exam  Nursing note and vitals reviewed. Constitutional: She is oriented to person, place, and time. She appears well-developed and well-nourished. No distress.  Eyes: Conjunctivae are normal. No scleral icterus.  Neck: Neck supple. No tracheal deviation present.  Cardiovascular: Normal rate, regular rhythm, normal heart sounds and intact distal pulses.   Pulmonary/Chest: Effort normal and breath sounds normal. No respiratory distress.  Abdominal: Soft. Normal appearance and bowel sounds are normal. She exhibits no distension and no mass. There is no tenderness. There is no rebound and no guarding.  Genitourinary:       No cva tenderness  Musculoskeletal: She exhibits no edema.  Neurological: She is alert and oriented to person, place, and time.  Skin: Skin is warm and dry. No rash noted.  Psychiatric: She has a normal mood and affect.    ED Course  Procedures (including critical care time)   Labs Reviewed  BASIC METABOLIC PANEL  CBC  URINALYSIS, ROUTINE W REFLEX MICROSCOPIC   Results for orders placed during the hospital encounter of 11/20/11  BASIC METABOLIC PANEL      Component Value Range   Sodium 134 (*) 135 - 145 (mEq/L)   Potassium 4.2  3.5 - 5.1 (mEq/L)   Chloride 101  96 - 112 (mEq/L)   CO2 20  19 - 32 (mEq/L)   Glucose, Bld 129 (*) 70 - 99 (mg/dL)   BUN 23  6 - 23 (mg/dL)   Creatinine, Ser 4.09  0.50 - 1.10 (mg/dL)   Calcium 9.2  8.4 - 81.1 (mg/dL)   GFR calc non  Af Amer 65 (*) >90 (mL/min)   GFR calc Af Amer 76 (*) >90 (mL/min)  CBC      Component Value Range   WBC 10.7 (*) 4.0 - 10.5 (K/uL)   RBC 4.81  3.87 - 5.11 (MIL/uL)   Hemoglobin 12.8  12.0 - 15.0 (g/dL)   HCT 91.4  78.2 - 95.6 (%)   MCV 79.0  78.0 - 100.0 (fL)   MCH 26.6  26.0 - 34.0 (pg)   MCHC 33.7  30.0 - 36.0 (g/dL)   RDW 21.3  08.6 - 57.8 (%)   Platelets 273  150 - 400 (K/uL)  URINALYSIS, ROUTINE W REFLEX MICROSCOPIC      Component Value Range   Color, Urine YELLOW  YELLOW    APPearance CLOUDY (*) CLEAR    Specific Gravity, Urine 1.015  1.005 - 1.030    pH 6.0  5.0 - 8.0    Glucose, UA NEGATIVE  NEGATIVE (mg/dL)   Hgb urine dipstick MODERATE (*) NEGATIVE    Bilirubin Urine NEGATIVE  NEGATIVE    Ketones, ur NEGATIVE  NEGATIVE (mg/dL)   Protein, ur NEGATIVE  NEGATIVE (mg/dL)   Urobilinogen, UA 0.2  0.0 - 1.0 (mg/dL)   Nitrite POSITIVE (*) NEGATIVE    Leukocytes, UA LARGE (*) NEGATIVE   URINE MICROSCOPIC-ADD ON      Component Value Range   Squamous Epithelial / LPF RARE  RARE    WBC, UA 7-10  <3 (WBC/hpf)   RBC / HPF 3-6  <3 (RBC/hpf)   Bacteria, UA MANY (*) RARE        MDM  Iv ns. Morphine iv. zofran iv.   Reviewed nursing notes and prior records.  Labs.   ua positive. u cx added to labs. Rocephin iv.   Suprapubic pain persists. abd soft nt. Morphine iv. Pyridium po.   Recheck pt comfortable. Discussed labs w pt.   Pt anxious about d/c. Hx anxiety. Reassurred. Ativan po.   abd soft nt. Reviewed prior admit 8/12 for similar symptoms, ct neg, no specific dx.   Discussed w pts MD, Dr Madelin Rear,  - agreeable w d/c plan, states he is happy to follow up with pt in next couple days.       Suzi Roots, MD 11/20/11 4696  Suzi Roots, MD 11/20/11 1201

## 2011-11-20 NOTE — ED Notes (Signed)
Patient states " i can't go home with all of this pain!! Does the hospital just send old people home to die?"  Dr. Denton Lank made aware of concerns.

## 2011-11-20 NOTE — ED Notes (Signed)
Spoke with Samara Deist Olegario Messier) Roxy Cedar [phone]. Niece- will come to pick up.

## 2011-11-20 NOTE — ED Notes (Signed)
To ED  Via [facility] with c/o urinary frequency and urgency. Hx UTI and interstitial cystitis.

## 2011-11-22 LAB — URINE CULTURE
Colony Count: >=100000
Culture  Setup Time: 201301301934

## 2011-11-23 NOTE — ED Notes (Signed)
Treated with KEFLEX; Sensitive to same

## 2012-09-05 ENCOUNTER — Emergency Department (HOSPITAL_COMMUNITY): Payer: Medicare Other

## 2012-09-05 ENCOUNTER — Emergency Department (HOSPITAL_COMMUNITY)
Admission: EM | Admit: 2012-09-05 | Discharge: 2012-09-06 | Disposition: A | Discharge status: Home / Self Care | Payer: Medicare Other | Attending: Emergency Medicine | Admitting: Emergency Medicine

## 2012-09-05 ENCOUNTER — Encounter (HOSPITAL_COMMUNITY): Payer: Self-pay | Admitting: *Deleted

## 2012-09-05 DIAGNOSIS — N3010 Interstitial cystitis (chronic) without hematuria: Secondary | ICD-10-CM | POA: Insufficient documentation

## 2012-09-05 DIAGNOSIS — M545 Low back pain, unspecified: Secondary | ICD-10-CM | POA: Insufficient documentation

## 2012-09-05 DIAGNOSIS — I10 Essential (primary) hypertension: Secondary | ICD-10-CM | POA: Insufficient documentation

## 2012-09-05 DIAGNOSIS — Z7982 Long term (current) use of aspirin: Secondary | ICD-10-CM | POA: Insufficient documentation

## 2012-09-05 DIAGNOSIS — R110 Nausea: Secondary | ICD-10-CM | POA: Insufficient documentation

## 2012-09-05 DIAGNOSIS — Z79899 Other long term (current) drug therapy: Secondary | ICD-10-CM | POA: Insufficient documentation

## 2012-09-05 LAB — BASIC METABOLIC PANEL
BUN: 15 mg/dL (ref 6–23)
CO2: 21 mEq/L (ref 19–32)
Calcium: 9.1 mg/dL (ref 8.4–10.5)
Chloride: 102 mEq/L (ref 96–112)
Creatinine, Ser: 0.9 mg/dL (ref 0.50–1.10)
GFR calc Af Amer: 68 mL/min — ABNORMAL LOW (ref >90)
GFR calc non Af Amer: 59 mL/min — ABNORMAL LOW (ref >90)
Glucose, Bld: 125 mg/dL — ABNORMAL HIGH (ref 70–99)
Potassium: 3.6 mEq/L (ref 3.5–5.1)
Sodium: 136 mEq/L (ref 135–145)

## 2012-09-05 LAB — URINALYSIS, ROUTINE W REFLEX MICROSCOPIC
Bilirubin Urine: NEGATIVE
Glucose, UA: NEGATIVE mg/dL
Ketones, ur: NEGATIVE mg/dL
Leukocytes, UA: NEGATIVE
Nitrite: NEGATIVE
Protein, ur: NEGATIVE mg/dL
Specific Gravity, Urine: 1.019 (ref 1.005–1.030)
Urobilinogen, UA: 0.2 mg/dL (ref 0.0–1.0)
pH: 5.5 (ref 5.0–8.0)

## 2012-09-05 LAB — URINE MICROSCOPIC-ADD ON

## 2012-09-05 LAB — DIFFERENTIAL
Basophils Absolute: 0 10*3/uL (ref 0.0–0.1)
Basophils Relative: 0 % (ref 0–1)
Eosinophils Absolute: 0.1 10*3/uL (ref 0.0–0.7)
Eosinophils Relative: 1 % (ref 0–5)
Lymphocytes Relative: 20 % (ref 12–46)
Lymphs Abs: 1.7 10*3/uL (ref 0.7–4.0)
Monocytes Absolute: 0.5 10*3/uL (ref 0.1–1.0)
Monocytes Relative: 5 % (ref 3–12)
Neutro Abs: 6.3 10*3/uL (ref 1.7–7.7)
Neutrophils Relative %: 74 % (ref 43–77)

## 2012-09-05 LAB — HEPATIC FUNCTION PANEL
ALT: 17 U/L (ref 0–35)
AST: 19 U/L (ref 0–37)
Albumin: 3.8 g/dL (ref 3.5–5.2)
Alkaline Phosphatase: 84 U/L (ref 39–117)
Bilirubin, Direct: <0.1 mg/dL (ref 0.0–0.3)
Total Bilirubin: 0.2 mg/dL — ABNORMAL LOW (ref 0.3–1.2)
Total Protein: 7.5 g/dL (ref 6.0–8.3)

## 2012-09-05 LAB — CBC
HCT: 41.4 % (ref 36.0–46.0)
Hemoglobin: 13.6 g/dL (ref 12.0–15.0)
MCH: 25.2 pg — ABNORMAL LOW (ref 26.0–34.0)
MCHC: 32.9 g/dL (ref 30.0–36.0)
MCV: 76.7 fL — ABNORMAL LOW (ref 78.0–100.0)
Platelets: 272 10*3/uL (ref 150–400)
RBC: 5.4 MIL/uL — ABNORMAL HIGH (ref 3.87–5.11)
RDW: 15.2 % (ref 11.5–15.5)
WBC: 7.6 10*3/uL (ref 4.0–10.5)

## 2012-09-05 MED ORDER — HYDROMORPHONE HCL PF 1 MG/ML IJ SOLN
0.5000 mg | Freq: Once | INTRAMUSCULAR | Status: AC
  Administered 2012-09-05: 0.5 mg via INTRAVENOUS
  Filled 2012-09-05: qty 1

## 2012-09-05 MED ORDER — MORPHINE SULFATE 4 MG/ML IJ SOLN
4.0000 mg | Freq: Once | INTRAMUSCULAR | Status: AC
  Administered 2012-09-05: 4 mg via INTRAVENOUS
  Filled 2012-09-05: qty 1

## 2012-09-05 MED ORDER — HYDROMORPHONE HCL PF 1 MG/ML IJ SOLN
0.5000 mg | Freq: Once | INTRAMUSCULAR | Status: AC
Start: 2012-09-05 — End: 2012-09-05
  Administered 2012-09-05: 0.5 mg via INTRAVENOUS
  Filled 2012-09-05: qty 1

## 2012-09-05 NOTE — ED Notes (Signed)
Pt reports seeing PCP yesterday for routine check up and states her sleeping medication was changed (unsure of the name). Pt c/o of left and right lower quadrant abdominal pain and nausea that started today.

## 2012-09-05 NOTE — ED Notes (Signed)
Patient transported to X-ray 

## 2012-09-05 NOTE — ED Provider Notes (Signed)
History     CSN: [id number]  Arrival date & time 09/05/12  1850   First MD Initiated Contact with Patient 09/05/12 2005      Chief Complaint  Patient presents with  . Abdominal Pain  . Nausea    (Consider location/radiation/quality/duration/timing/severity/associated sxs/prior treatment) HPI History provided by pt.   Pt reports severe, diffuse, aching low back pain, that radiates through to lower abdomen since early this morning.  Started while laying in bed, has been constant and gradually worsened throughout the day.  No alleviating/aggravating factors.  Able to ambulate.  Associated w/ nausea.  Denies fever, vomiting, diarrhea, hematemesis/hematochezia/melena and GU sx as well as LE weakness/paresethesias.  Past abd surgeries include total hyst.  Had a lumbar fusion in 2010.  She reports chronic low back stiffness since surgery, but no pain similar to what she is experiencing now.  No recent trauma or heavy lifting.   Past Medical History  Diagnosis Date  . Hypertension   . Interstitial cystitis     Past Surgical History  Procedure Date  . Abdominal hysterectomy     Family History  Problem Relation Age of Onset  . Heart failure Mother   . Heart failure Father   . Cancer Sister   . Cancer Brother     History  Substance Use Topics  . Smoking status: Never Smoker   . Smokeless tobacco: Not on file  . Alcohol Use: No    OB History    Grav Para Term Preterm Abortions TAB SAB Ect Mult Living                  Review of Systems  All other systems reviewed and are negative.    Allergies  Tetanus toxoids and Penicillins  Home Medications   Current Outpatient Rx  Name  Route  Sig  Dispense  Refill  . ACETAMINOPHEN 325 MG PO TABS   Oral   Take 650 mg by mouth every 6 (six) hours as needed. For pain.         Marland Kitchen AMLODIPINE BESYLATE 10 MG PO TABS   Oral   Take 10 mg by mouth daily.         . ASPIRIN EC 81 MG PO TBEC   Oral   Take 162 mg by mouth  daily.         Marland Kitchen ESOMEPRAZOLE MAGNESIUM 40 MG PO CPDR   Oral   Take 40 mg by mouth daily.         Marland Kitchen ESTROGENS CONJUGATED 0.625 MG PO TABS   Oral   Take 1.25 mg by mouth daily.         Marland Kitchen LEVOTHYROXINE SODIUM 75 MCG PO TABS   Oral   Take 75 mcg by mouth daily.         Marland Kitchen LISINOPRIL 10 MG PO TABS   Oral   Take 10 mg by mouth daily.         . TRAZODONE HCL 50 MG PO TABS   Oral   Take 50 mg by mouth at bedtime.           BP 147/79  Pulse 88  Temp 98 F (36.7 C) (Oral)  Resp 16  SpO2 99%  Physical Exam  Nursing note and vitals reviewed. Constitutional: She is oriented to person, place, and time. She appears well-developed and well-nourished. No distress.  HENT:  Head: Normocephalic and atraumatic.  Eyes:       Normal appearance  Neck:  Normal range of motion.  Cardiovascular: Normal rate and regular rhythm.   Pulmonary/Chest: Effort normal and breath sounds normal. No respiratory distress.  Abdominal: Soft. Bowel sounds are normal. She exhibits no distension and no mass. There is no tenderness. There is no rebound and no guarding.  Genitourinary:       No CVA tenderness  Musculoskeletal: Normal range of motion.       Vertical surgical scar over lumbar spine.  Patient points to pain across entire low back.  Entire low back, including mid-line is non-tender.  Patient does not appear uncomfortable w/ position change in bed.  No pain w/ passive ROM of LEs.  5/5 LE strength, nml patellar reflexes and sensation and 2+ DP pulses.   Neurological: She is alert and oriented to person, place, and time.  Skin: Skin is warm and dry. No rash noted.  Psychiatric: She has a normal mood and affect. Her behavior is normal.    ED Course  Procedures (including critical care time)  Labs Reviewed  BASIC METABOLIC PANEL - Abnormal; Notable for the following:    Glucose, Bld 125 (*)     GFR calc non Af Amer 59 (*)     GFR calc Af Amer 68 (*)     All other components within  normal limits  CBC - Abnormal; Notable for the following:    RBC 5.40 (*)     MCV 76.7 (*)     MCH 25.2 (*)     All other components within normal limits  URINALYSIS, ROUTINE W REFLEX MICROSCOPIC - Abnormal; Notable for the following:    APPearance CLOUDY (*)     Hgb urine dipstick TRACE (*)     All other components within normal limits  HEPATIC FUNCTION PANEL - Abnormal; Notable for the following:    Total Bilirubin 0.2 (*)     All other components within normal limits  URINE MICROSCOPIC-ADD ON  DIFFERENTIAL   Dg Lumbar Spine Complete  09/05/2012  *RADIOLOGY REPORT*  Clinical Data: Lower back pain  LUMBAR SPINE - COMPLETE 4+ VIEW  Comparison: 06/09/2011 CT  Findings: Postoperative changes with posterior rod and bipedicle screw fixation at L4-5. Mild anterior wedging at L1, similar to prior.  Multilevel degenerative changes.  Mild exaggerated kyphosis at T12-L1, similar to prior. Minimal retrolisthesis of L3 and L4 and L2-1 L3. Multilevel facet arthropathy, most pronounced at L5 S1.  No acute fracture identified. Atherosclerotic vascular calcification. Overlying soft tissues otherwise unremarkable.  IMPRESSION: Multilevel degenerative changes and L4-5 postoperative changes.  No acute osseous abnormality.   Original Report Authenticated By: Jearld Lesch, M.D.    Ct Abdomen Pelvis W Contrast  09/06/2012  *RADIOLOGY REPORT*  Clinical Data: Lower abdominal pain/back pain  CT ABDOMEN AND PELVIS WITH CONTRAST  Technique:  Multidetector CT imaging of the abdomen and pelvis was performed following the standard protocol during bolus administration of intravenous contrast.  Contrast: OMNIPAQUE IOHEXOL 300 MG/ML  SOLN  Comparison: 06/09/2011  Findings: Heart size within normal limits.  No pleural or pericardial effusion.  Lung bases are predominately clear.  There may be a small hiatal hernia.  Diffuse low attenuation of the liver is nonspecific post contrast however may reflect fatty  infiltration.  More focal hypoattenuation adjacent to the falciform ligament may reflect focal fat. Distended gallbladder.  No gallbladder wall thickening or pericholecystic fluid.  Layering gallstones.  No biliary ductal dilatation.  Unremarkable spleen.  Homogeneous pancreatic enhancement. Unremarkable adrenal glands.  Lobular renal contours.  No hydronephrosis or hydroureter.  No urinary tract calculi identified.  Colonic diverticulosis.  No CT evidence for diverticulitis.  No bowel obstruction.  Appendix not identified.  No right lower quadrant inflammation.  There is scattered atherosclerotic calcification of the aorta and its branches. No aneurysmal dilatation.  Thin-walled bladder.  Absent uterus.  No adnexal mass.  Multilevel degenerative changes and diffuse osteopenia.  Sequelae of prior posterior 10th rib fracture on the left.  Prior posterior fusion at L4-5.  Mild anterolisthesis of L4 on L5.  L1 compression deformity is similar to prior.  IMPRESSION: No acute process identified.  Hepatic steatosis.  Cholelithiasis without CT evidence for cholecystitis.  Colonic diverticulosis without CT evidence for diverticulitis.  Postoperative changes at L4-5.  No acute osseous finding.   Original Report Authenticated By: Jearld Lesch, M.D.      1. Low back pain       MDM  78yo F w/ h/o lumbar fusion, presents w/ c/o non-traumatic low back pain as well as diffuse lower abdominal pain.  Abd pain seems to radiate from back.  Pt requested pain medication before I even had a chance to introduce myself.  On exam, she is afebrile, mildly hypertensive but VS otherwise nml, well-appearing, abd benign, no lumbar spine or CVA tenderness.  Labs and xray lumbar spine unremarkable.  Pt received IV morphine for pain w/out relief.  On re-examination, lower abdomen diffusely tender.  CT abd/pelvis ordered and pending.  Pt to receive 0.5mg  IV dilaudid.  Will reassess shortly.  9:44 PM   CT negative for acute pathology.  Results discussed w/ pt.  She reports feeling better.  On repeat exam, VSS, abd non-tender and pt appears comfortable.  D/c'd home w/ vicodin for pain (she will use her walker while on this medication) and recommended F/u with her neurosurgeon as well as PCP. Return precautions discussed.       Otilio Miu, [place] 09/06/12 0127

## 2012-09-06 MED ORDER — IOHEXOL 300 MG/ML  SOLN
100.0000 mL | Freq: Once | INTRAMUSCULAR | Status: AC | PRN
  Administered 2012-09-06: 100 mL via INTRAVENOUS

## 2012-09-06 MED ORDER — HYDROCODONE-ACETAMINOPHEN 5-325 MG PO TABS: 1.0000 | ORAL_TABLET | ORAL | Status: DC | PRN

## 2012-09-06 NOTE — ED Notes (Signed)
PA at bedside to discuss dispostion 

## 2012-09-06 NOTE — ED Provider Notes (Signed)
Medical screening examination/treatment/procedure(s) were conducted as a shared visit with non-physician practitioner(s) and myself.  I personally evaluated the patient during the encounter On my exam the patient seemed comfortably, reclined, speaking clearly, in no distress.  Gerhard Munch, MD 09/06/12 1622

## 2012-09-06 NOTE — ED Notes (Signed)
Patient transported to CT 

## 2012-09-23 ENCOUNTER — Ambulatory Visit (INDEPENDENT_AMBULATORY_CARE_PROVIDER_SITE_OTHER): Payer: Medicare Other | Admitting: General Surgery

## 2012-09-23 ENCOUNTER — Encounter (INDEPENDENT_AMBULATORY_CARE_PROVIDER_SITE_OTHER): Payer: Self-pay | Admitting: General Surgery

## 2012-09-23 VITALS — BP 152/94 | HR 109 | Temp 97.3°F | Ht 64.5 in | Wt 173.0 lb

## 2012-09-23 DIAGNOSIS — K8020 Calculus of gallbladder without cholecystitis without obstruction: Secondary | ICD-10-CM

## 2012-09-23 NOTE — Progress Notes (Signed)
Patient ID: Wendy Andrews, female   DOB: 11-May-1930, 78 y.o.   MRN: [id number]  Chief Complaint  Patient presents with  . Pre-op Exam    eval gallbladder    HPI Wendy Andrews is a 78 y.o. female.  This patient is referred by Dr. Su Hilt for evaluationof cholelithiasis. She has not had any abdominal pain but last weekend began having lower back pain which started Saturday morning and progressively increased until she went to the emergency room by EMS. She described this as a "ache" in the midline of her lower back without any associated abdominal pain. She had some dry heaves no vomiting and denied any fevers. She has not had any jaundice and states her bowels are okay. She had a CT scan in the emergency room which demonstrated a distended gallbladder with layering gallstones but no evidence of cholecystitis. HPI  Past Medical History  Diagnosis Date  . Hypertension   . Interstitial cystitis     Past Surgical History  Procedure Date  . Abdominal hysterectomy     Family History  Problem Relation Age of Onset  . Heart failure Mother   . Heart failure Father   . Cancer Sister   . Cancer Brother     lung  . Cancer Brother     brain    Social History History  Substance Use Topics  . Smoking status: Never Smoker   . Smokeless tobacco: Not on file  . Alcohol Use: No    Allergies  Allergen Reactions  . Tetanus Toxoids     unknown  . Penicillins Hives and Rash    Current Outpatient Prescriptions  Medication Sig Dispense Refill  . acetaminophen (TYLENOL) 325 MG tablet Take 650 mg by mouth every 6 (six) hours as needed. For pain.      Marland Kitchen amLODipine (NORVASC) 10 MG tablet Take 10 mg by mouth daily.      Marland Kitchen aspirin EC 81 MG tablet Take 162 mg by mouth daily.      Marland Kitchen esomeprazole (NEXIUM) 40 MG capsule Take 40 mg by mouth daily.      Marland Kitchen estrogens, conjugated, (PREMARIN) 0.625 MG tablet Take 1.25 mg by mouth daily.      Marland Kitchen HYDROcodone-acetaminophen (NORCO/VICODIN) 5-325 MG per  tablet Take 1 tablet by mouth every 4 (four) hours as needed for pain.  20 tablet  0  . levothyroxine (SYNTHROID, LEVOTHROID) 75 MCG tablet Take 75 mcg by mouth daily.      Marland Kitchen lisinopril (PRINIVIL,ZESTRIL) 10 MG tablet Take 10 mg by mouth daily.      . traZODone (DESYREL) 50 MG tablet Take 50 mg by mouth at bedtime.        Review of Systems Review of Systems All other review of systems negative or noncontributory except as stated in the HPI  Blood pressure 152/94, pulse 109, temperature 97.3 F (36.3 C), temperature source Temporal, height 5' 4.5" (1.638 m), weight 173 lb (78.472 kg), last menstrual period 10/22/1959, SpO2 98.00%.  Physical Exam Physical Exam Physical Exam  Nursing note and vitals reviewed. Constitutional: She is oriented to person, place, and time. She appears well-developed and well-nourished. No distress.  HENT:  Head: Normocephalic and atraumatic.  Mouth/Throat: No oropharyngeal exudate.  Eyes: Conjunctivae and EOM are normal. Pupils are equal, round, and reactive to light. Right eye exhibits no discharge. Left eye exhibits no discharge. No scleral icterus.  Neck: Normal range of motion. Neck supple. No tracheal deviation present.  Cardiovascular: Normal rate, regular  rhythm, normal heart sounds and intact distal pulses.   Pulmonary/Chest: Effort normal and breath sounds normal. No stridor. No respiratory distress. She has no wheezes.  Abdominal: Soft. Bowel sounds are normal. She exhibits no distension and no mass. There is no tenderness. There is no rebound and no guarding.  Musculoskeletal: Normal range of motion. She exhibits no edema and no tenderness. She has a WHSS in the area of the discomfort. Neurological: She is alert and oriented to person, place, and time.  Skin: Skin is warm and dry. No rash noted. She is not diaphoretic. No erythema. No pallor.  Psychiatric: She has a normal mood and affect. Her behavior is normal. Judgment and thought content normal.     Data Reviewed CT  Assessment    Cholelithiasis and back pain I am not certain that her episode of back pain was related to her gallbladder. She does have cholelithiasis on CT scan of and some of her symptoms that could be related however, her main complaint was of lower back pain in the area of her prior surgery. She has not had any abdominal pain and has not had any other food intolerance or other symptoms prior to this. We discussed the signs and symptoms of symptomatic cholelithiasis and I recommended that she continue with observation and if she continues to have symptoms or has any postprandial pain, to call me back and we would begin discussed the possibility of cholecystectomy. She does have gallstones but again, it is really unclear if this is actually the cause of her recent ER visit    Plan    She will call back if she continues to have symptoms and we will discuss possibility of cholecystectomy       Wendy Andrews 09/23/2012, 11:32 AM

## 2012-10-13 ENCOUNTER — Encounter (INDEPENDENT_AMBULATORY_CARE_PROVIDER_SITE_OTHER): Payer: Self-pay

## 2013-12-10 ENCOUNTER — Inpatient Hospital Stay (HOSPITAL_COMMUNITY)
Admission: EM | Admit: 2013-12-10 | Discharge: 2013-12-12 | DRG: 418 | Disposition: A | Discharge status: Home / Self Care | Payer: Medicare Other | Attending: Surgery | Admitting: Surgery

## 2013-12-10 ENCOUNTER — Inpatient Hospital Stay (HOSPITAL_COMMUNITY): Payer: Medicare Other

## 2013-12-10 ENCOUNTER — Emergency Department (HOSPITAL_COMMUNITY): Payer: Medicare Other

## 2013-12-10 ENCOUNTER — Inpatient Hospital Stay (HOSPITAL_COMMUNITY): Payer: Medicare Other | Admitting: Anesthesiology

## 2013-12-10 ENCOUNTER — Encounter (HOSPITAL_COMMUNITY): Payer: Self-pay | Admitting: Emergency Medicine

## 2013-12-10 ENCOUNTER — Encounter (HOSPITAL_COMMUNITY): Admission: EM | Disposition: A | Discharge status: Home / Self Care | Payer: Self-pay | Source: Home / Self Care

## 2013-12-10 ENCOUNTER — Encounter (HOSPITAL_COMMUNITY): Payer: Medicare Other | Admitting: Anesthesiology

## 2013-12-10 DIAGNOSIS — Z79899 Other long term (current) drug therapy: Secondary | ICD-10-CM

## 2013-12-10 DIAGNOSIS — N390 Urinary tract infection, site not specified: Secondary | ICD-10-CM

## 2013-12-10 DIAGNOSIS — F411 Generalized anxiety disorder: Secondary | ICD-10-CM | POA: Diagnosis present

## 2013-12-10 DIAGNOSIS — K8010 Calculus of gallbladder with chronic cholecystitis without obstruction: Secondary | ICD-10-CM

## 2013-12-10 DIAGNOSIS — E039 Hypothyroidism, unspecified: Secondary | ICD-10-CM | POA: Diagnosis present

## 2013-12-10 DIAGNOSIS — K810 Acute cholecystitis: Secondary | ICD-10-CM | POA: Diagnosis present

## 2013-12-10 DIAGNOSIS — Z801 Family history of malignant neoplasm of trachea, bronchus and lung: Secondary | ICD-10-CM

## 2013-12-10 DIAGNOSIS — Z9049 Acquired absence of other specified parts of digestive tract: Secondary | ICD-10-CM

## 2013-12-10 DIAGNOSIS — G4700 Insomnia, unspecified: Secondary | ICD-10-CM | POA: Diagnosis present

## 2013-12-10 DIAGNOSIS — Z8249 Family history of ischemic heart disease and other diseases of the circulatory system: Secondary | ICD-10-CM

## 2013-12-10 DIAGNOSIS — Z880 Allergy status to penicillin: Secondary | ICD-10-CM

## 2013-12-10 DIAGNOSIS — K8000 Calculus of gallbladder with acute cholecystitis without obstruction: Principal | ICD-10-CM | POA: Diagnosis present

## 2013-12-10 DIAGNOSIS — I10 Essential (primary) hypertension: Secondary | ICD-10-CM | POA: Diagnosis present

## 2013-12-10 DIAGNOSIS — E785 Hyperlipidemia, unspecified: Secondary | ICD-10-CM | POA: Diagnosis present

## 2013-12-10 HISTORY — PX: CHOLECYSTECTOMY: SHX55

## 2013-12-10 HISTORY — DX: Insomnia, unspecified: G47.00

## 2013-12-10 HISTORY — DX: Hyperlipidemia, unspecified: E78.5

## 2013-12-10 LAB — COMPREHENSIVE METABOLIC PANEL
ALT: 11 U/L (ref 0–35)
AST: 27 U/L (ref 0–37)
Albumin: 3.6 g/dL (ref 3.5–5.2)
Alkaline Phosphatase: 66 U/L (ref 39–117)
BUN: 14 mg/dL (ref 6–23)
CO2: 21 mEq/L (ref 19–32)
Calcium: 9.4 mg/dL (ref 8.4–10.5)
Chloride: 100 mEq/L (ref 96–112)
Creatinine, Ser: 0.94 mg/dL (ref 0.50–1.10)
GFR calc Af Amer: 64 mL/min — ABNORMAL LOW (ref >90)
GFR calc non Af Amer: 55 mL/min — ABNORMAL LOW (ref >90)
Glucose, Bld: 131 mg/dL — ABNORMAL HIGH (ref 70–99)
Potassium: 3.7 mEq/L (ref 3.7–5.3)
Sodium: 139 mEq/L (ref 137–147)
Total Bilirubin: 0.3 mg/dL (ref 0.3–1.2)
Total Protein: 7.3 g/dL (ref 6.0–8.3)

## 2013-12-10 LAB — URINE MICROSCOPIC-ADD ON

## 2013-12-10 LAB — URINALYSIS, ROUTINE W REFLEX MICROSCOPIC
Bilirubin Urine: NEGATIVE
Glucose, UA: NEGATIVE mg/dL
Ketones, ur: NEGATIVE mg/dL
Nitrite: POSITIVE — AB
Protein, ur: 30 mg/dL — AB
Specific Gravity, Urine: 1.025 (ref 1.005–1.030)
Urobilinogen, UA: 0.2 mg/dL (ref 0.0–1.0)
pH: 6 (ref 5.0–8.0)

## 2013-12-10 LAB — CBC WITH DIFFERENTIAL/PLATELET
Basophils Absolute: 0 10*3/uL (ref 0.0–0.1)
Basophils Relative: 0 % (ref 0–1)
Eosinophils Absolute: 0.1 10*3/uL (ref 0.0–0.7)
Eosinophils Relative: 1 % (ref 0–5)
HCT: 41.5 % (ref 36.0–46.0)
Hemoglobin: 13.7 g/dL (ref 12.0–15.0)
Lymphocytes Relative: 10 % — ABNORMAL LOW (ref 12–46)
Lymphs Abs: 1.5 10*3/uL (ref 0.7–4.0)
MCH: 27 pg (ref 26.0–34.0)
MCHC: 33 g/dL (ref 30.0–36.0)
MCV: 81.7 fL (ref 78.0–100.0)
Monocytes Absolute: 1 10*3/uL (ref 0.1–1.0)
Monocytes Relative: 7 % (ref 3–12)
Neutro Abs: 13 10*3/uL — ABNORMAL HIGH (ref 1.7–7.7)
Neutrophils Relative %: 83 % — ABNORMAL HIGH (ref 43–77)
Platelets: 238 10*3/uL (ref 150–400)
RBC: 5.08 MIL/uL (ref 3.87–5.11)
RDW: 14.8 % (ref 11.5–15.5)
WBC: 15.7 10*3/uL — ABNORMAL HIGH (ref 4.0–10.5)

## 2013-12-10 LAB — I-STAT TROPONIN, ED: Troponin i, poc: 0.01 ng/mL (ref 0.00–0.08)

## 2013-12-10 LAB — LIPASE, BLOOD: Lipase: 43 U/L (ref 11–59)

## 2013-12-10 SURGERY — LAPAROSCOPIC CHOLECYSTECTOMY WITH INTRAOPERATIVE CHOLANGIOGRAM
Anesthesia: General | Site: Abdomen

## 2013-12-10 MED ORDER — MORPHINE SULFATE 2 MG/ML IJ SOLN
2.0000 mg | INTRAMUSCULAR | Status: DC | PRN
  Administered 2013-12-11 (×2): 2 mg via INTRAVENOUS
  Filled 2013-12-10 (×2): qty 1

## 2013-12-10 MED ORDER — MORPHINE SULFATE 2 MG/ML IJ SOLN
2.0000 mg | Freq: Once | INTRAMUSCULAR | Status: AC
  Administered 2013-12-10: 2 mg via INTRAVENOUS
  Filled 2013-12-10: qty 1

## 2013-12-10 MED ORDER — LACTATED RINGERS IV SOLN
INTRAVENOUS | Status: DC | PRN
  Administered 2013-12-10 (×2): via INTRAVENOUS

## 2013-12-10 MED ORDER — CIPROFLOXACIN IN D5W 400 MG/200ML IV SOLN
INTRAVENOUS | Status: AC
  Filled 2013-12-10: qty 200

## 2013-12-10 MED ORDER — FENTANYL CITRATE 0.05 MG/ML IJ SOLN
INTRAMUSCULAR | Status: AC
Start: 2013-12-10 — End: 2013-12-10
  Filled 2013-12-10: qty 5

## 2013-12-10 MED ORDER — ONDANSETRON HCL 4 MG/2ML IJ SOLN: 4.0000 mg | Freq: Four times a day (QID) | INTRAMUSCULAR | Status: DC | PRN

## 2013-12-10 MED ORDER — ACETAMINOPHEN 10 MG/ML IV SOLN
1000.0000 mg | Freq: Once | INTRAVENOUS | Status: AC
  Administered 2013-12-10: 1000 mg via INTRAVENOUS
  Filled 2013-12-10: qty 100

## 2013-12-10 MED ORDER — MEPERIDINE HCL 50 MG/ML IJ SOLN: 6.2500 mg | INTRAMUSCULAR | Status: DC | PRN

## 2013-12-10 MED ORDER — CIPROFLOXACIN IN D5W 400 MG/200ML IV SOLN
400.0000 mg | Freq: Two times a day (BID) | INTRAVENOUS | Status: DC
  Administered 2013-12-10: 400 mg via INTRAVENOUS
  Filled 2013-12-10: qty 200

## 2013-12-10 MED ORDER — 0.9 % SODIUM CHLORIDE (POUR BTL) OPTIME
TOPICAL | Status: DC | PRN
  Administered 2013-12-10: 1000 mL

## 2013-12-10 MED ORDER — MORPHINE SULFATE 2 MG/ML IJ SOLN
1.0000 mg | INTRAMUSCULAR | Status: DC | PRN
Start: 2013-12-10 — End: 2013-12-11

## 2013-12-10 MED ORDER — METRONIDAZOLE IN NACL 5-0.79 MG/ML-% IV SOLN
500.0000 mg | Freq: Four times a day (QID) | INTRAVENOUS | Status: AC
  Administered 2013-12-10 (×2): 500 mg via INTRAVENOUS
  Filled 2013-12-10 (×2): qty 100

## 2013-12-10 MED ORDER — HYDROMORPHONE HCL PF 1 MG/ML IJ SOLN
INTRAMUSCULAR | Status: AC
  Filled 2013-12-10: qty 1

## 2013-12-10 MED ORDER — PROPOFOL 10 MG/ML IV BOLUS
INTRAVENOUS | Status: DC | PRN
  Administered 2013-12-10: 140 mg via INTRAVENOUS

## 2013-12-10 MED ORDER — HYDRALAZINE HCL 20 MG/ML IJ SOLN
INTRAMUSCULAR | Status: DC | PRN
  Administered 2013-12-10: 4 mg via INTRAVENOUS
  Administered 2013-12-10: 6 mg via INTRAVENOUS
  Administered 2013-12-10: 4 mg via INTRAVENOUS

## 2013-12-10 MED ORDER — OXYCODONE HCL 5 MG PO TABS: 5.0000 mg | ORAL_TABLET | Freq: Once | ORAL | Status: DC | PRN

## 2013-12-10 MED ORDER — CISATRACURIUM BESYLATE (PF) 10 MG/5ML IV SOLN
INTRAVENOUS | Status: DC | PRN
  Administered 2013-12-10: 4 mg via INTRAVENOUS
  Administered 2013-12-10: 2 mg via INTRAVENOUS

## 2013-12-10 MED ORDER — PROMETHAZINE HCL 25 MG/ML IJ SOLN: 6.2500 mg | INTRAMUSCULAR | Status: DC | PRN

## 2013-12-10 MED ORDER — CIPROFLOXACIN IN D5W 400 MG/200ML IV SOLN
400.0000 mg | Freq: Once | INTRAVENOUS | Status: AC
  Administered 2013-12-10: 400 mg via INTRAVENOUS
  Filled 2013-12-10: qty 200

## 2013-12-10 MED ORDER — CISATRACURIUM BESYLATE 20 MG/10ML IV SOLN
INTRAVENOUS | Status: AC
  Filled 2013-12-10: qty 10

## 2013-12-10 MED ORDER — SUCCINYLCHOLINE CHLORIDE 20 MG/ML IJ SOLN
INTRAMUSCULAR | Status: DC | PRN
  Administered 2013-12-10: 100 mg via INTRAVENOUS

## 2013-12-10 MED ORDER — IOHEXOL 300 MG/ML  SOLN
50.0000 mL | Freq: Once | INTRAMUSCULAR | Status: AC | PRN
  Administered 2013-12-10: 50 mL via ORAL

## 2013-12-10 MED ORDER — FENTANYL CITRATE 0.05 MG/ML IJ SOLN
25.0000 ug | INTRAMUSCULAR | Status: DC | PRN
  Administered 2013-12-10 (×4): 25 ug via INTRAVENOUS

## 2013-12-10 MED ORDER — PANTOPRAZOLE SODIUM 40 MG IV SOLR
40.0000 mg | Freq: Every day | INTRAVENOUS | Status: DC
  Administered 2013-12-10: 40 mg via INTRAVENOUS
  Filled 2013-12-10 (×2): qty 40

## 2013-12-10 MED ORDER — GLYCOPYRROLATE 0.2 MG/ML IJ SOLN
INTRAMUSCULAR | Status: AC
  Filled 2013-12-10: qty 3

## 2013-12-10 MED ORDER — NEOSTIGMINE METHYLSULFATE 1 MG/ML IJ SOLN
INTRAMUSCULAR | Status: AC
  Filled 2013-12-10: qty 10

## 2013-12-10 MED ORDER — ONDANSETRON HCL 4 MG/2ML IJ SOLN
INTRAMUSCULAR | Status: DC | PRN
  Administered 2013-12-10: 4 mg via INTRAVENOUS

## 2013-12-10 MED ORDER — LACTATED RINGERS IV SOLN: INTRAVENOUS | Status: DC

## 2013-12-10 MED ORDER — NEOSTIGMINE METHYLSULFATE 1 MG/ML IJ SOLN
INTRAMUSCULAR | Status: DC | PRN
  Administered 2013-12-10: 4 mg via INTRAVENOUS

## 2013-12-10 MED ORDER — CIPROFLOXACIN IN D5W 400 MG/200ML IV SOLN
400.0000 mg | Freq: Two times a day (BID) | INTRAVENOUS | Status: AC
  Administered 2013-12-10: 400 mg via INTRAVENOUS
  Filled 2013-12-10: qty 200

## 2013-12-10 MED ORDER — IOHEXOL 300 MG/ML  SOLN
INTRAMUSCULAR | Status: DC | PRN
  Administered 2013-12-10: 10 mL via INTRAVENOUS

## 2013-12-10 MED ORDER — GLYCOPYRROLATE 0.2 MG/ML IJ SOLN
INTRAMUSCULAR | Status: DC | PRN
  Administered 2013-12-10: 0.6 mg via INTRAVENOUS

## 2013-12-10 MED ORDER — DICYCLOMINE HCL 10 MG/ML IM SOLN
20.0000 mg | Freq: Once | INTRAMUSCULAR | Status: AC
  Administered 2013-12-10: 20 mg via INTRAMUSCULAR
  Filled 2013-12-10: qty 2

## 2013-12-10 MED ORDER — HYDROMORPHONE HCL PF 1 MG/ML IJ SOLN
0.2500 mg | INTRAMUSCULAR | Status: DC | PRN
  Administered 2013-12-10 (×2): 0.5 mg via INTRAVENOUS
  Administered 2013-12-10 (×2): 0.25 mg via INTRAVENOUS

## 2013-12-10 MED ORDER — FENTANYL CITRATE 0.05 MG/ML IJ SOLN
INTRAMUSCULAR | Status: DC | PRN
  Administered 2013-12-10: 100 ug via INTRAVENOUS
  Administered 2013-12-10 (×3): 50 ug via INTRAVENOUS

## 2013-12-10 MED ORDER — METRONIDAZOLE IN NACL 5-0.79 MG/ML-% IV SOLN
500.0000 mg | Freq: Once | INTRAVENOUS | Status: AC
  Administered 2013-12-10: 500 mg via INTRAVENOUS
  Filled 2013-12-10: qty 100

## 2013-12-10 MED ORDER — IOHEXOL 300 MG/ML  SOLN
100.0000 mL | Freq: Once | INTRAMUSCULAR | Status: AC | PRN
  Administered 2013-12-10: 100 mL via INTRAVENOUS

## 2013-12-10 MED ORDER — BUPIVACAINE-EPINEPHRINE PF 0.25-1:200000 % IJ SOLN
INTRAMUSCULAR | Status: AC
  Filled 2013-12-10: qty 30

## 2013-12-10 MED ORDER — OXYCODONE HCL 5 MG/5ML PO SOLN
5.0000 mg | Freq: Once | ORAL | Status: DC | PRN
  Filled 2013-12-10: qty 5

## 2013-12-10 MED ORDER — PROPOFOL 10 MG/ML IV BOLUS
INTRAVENOUS | Status: AC
  Filled 2013-12-10: qty 20

## 2013-12-10 MED ORDER — HYDROCODONE-ACETAMINOPHEN 5-325 MG PO TABS
1.0000 | ORAL_TABLET | ORAL | Status: DC | PRN
  Administered 2013-12-10 – 2013-12-12 (×4): 2 via ORAL
  Filled 2013-12-10 (×4): qty 2

## 2013-12-10 MED ORDER — ONDANSETRON HCL 4 MG/2ML IJ SOLN
INTRAMUSCULAR | Status: AC
  Filled 2013-12-10: qty 2

## 2013-12-10 MED ORDER — LACTATED RINGERS IR SOLN
Status: DC | PRN
  Administered 2013-12-10: 1

## 2013-12-10 MED ORDER — FENTANYL CITRATE 0.05 MG/ML IJ SOLN
INTRAMUSCULAR | Status: AC
  Filled 2013-12-10: qty 2

## 2013-12-10 MED ORDER — BUPIVACAINE-EPINEPHRINE 0.25% -1:200000 IJ SOLN
INTRAMUSCULAR | Status: DC | PRN
  Administered 2013-12-10: 20 mL

## 2013-12-10 MED ORDER — KCL-LACTATED RINGERS-D5W 20 MEQ/L IV SOLN
INTRAVENOUS | Status: DC
  Administered 2013-12-10 – 2013-12-12 (×3): via INTRAVENOUS
  Filled 2013-12-10 (×4): qty 1000

## 2013-12-10 SURGICAL SUPPLY — 43 items
ADH SKN CLS APL DERMABOND .7 (GAUZE/BANDAGES/DRESSINGS) ×1
APL SKNCLS STERI-STRIP NONHPOA (GAUZE/BANDAGES/DRESSINGS) ×1
APPLIER CLIP ROT 10 11.4 M/L (STAPLE) ×2
APR CLP MED LRG 11.4X10 (STAPLE) ×1
BAG SPEC RTRVL 10 TROC 200 (ENDOMECHANICALS) ×1
BAG SPEC RTRVL LRG 6X4 10 (ENDOMECHANICALS)
BENZOIN TINCTURE PRP APPL 2/3 (GAUZE/BANDAGES/DRESSINGS) ×2 IMPLANT
CANISTER SUCTION 2500CC (MISCELLANEOUS) ×2 IMPLANT
CHLORAPREP W/TINT 26ML (MISCELLANEOUS) ×2 IMPLANT
CHOLANGIOGRAM CATH TAUT (CATHETERS) ×2 IMPLANT
CLIP APPLIE ROT 10 11.4 M/L (STAPLE) ×1 IMPLANT
COVER MAYO STAND STRL (DRAPES) ×1 IMPLANT
DECANTER SPIKE VIAL GLASS SM (MISCELLANEOUS) ×2 IMPLANT
DERMABOND ADVANCED (GAUZE/BANDAGES/DRESSINGS) ×1
DERMABOND ADVANCED .7 DNX12 (GAUZE/BANDAGES/DRESSINGS) IMPLANT
DRAPE C-ARM 42X120 X-RAY (DRAPES) ×1 IMPLANT
DRAPE LAPAROSCOPIC ABDOMINAL (DRAPES) ×2 IMPLANT
DRAPE UTILITY XL STRL (DRAPES) ×2 IMPLANT
ELECT REM PT RETURN 9FT ADLT (ELECTROSURGICAL) ×2
ELECTRODE REM PT RTRN 9FT ADLT (ELECTROSURGICAL) ×1 IMPLANT
GLOVE SURG SIGNA 7.5 PF LTX (GLOVE) ×2 IMPLANT
GOWN STRL REUS W/TWL XL LVL3 (GOWN DISPOSABLE) ×6 IMPLANT
HEMOSTAT SURGICEL 4X8 (HEMOSTASIS) IMPLANT
IV CATH 14GX2 1/4 (CATHETERS) ×2 IMPLANT
IV SET MACRO CATH EXT 6 LUER (IV SETS) ×2 IMPLANT
KIT BASIN OR (CUSTOM PROCEDURE TRAY) ×2 IMPLANT
NS IRRIG 1000ML POUR BTL (IV SOLUTION) ×1 IMPLANT
POUCH RETRIEVAL ECOSAC 10 (ENDOMECHANICALS) IMPLANT
POUCH RETRIEVAL ECOSAC 10MM (ENDOMECHANICALS) ×1
POUCH SPECIMEN RETRIEVAL 10MM (ENDOMECHANICALS) IMPLANT
SET IRRIG TUBING LAPAROSCOPIC (IRRIGATION / IRRIGATOR) ×2 IMPLANT
SLEEVE XCEL OPT CAN 5 100 (ENDOMECHANICALS) ×2 IMPLANT
SOLUTION ANTI FOG 6CC (MISCELLANEOUS) ×2 IMPLANT
STOPCOCK 4 WAY LG BORE MALE ST (IV SETS) ×2 IMPLANT
STRIP CLOSURE SKIN 1/4X4 (GAUZE/BANDAGES/DRESSINGS) ×2 IMPLANT
SUT VIC AB 5-0 PS2 18 (SUTURE) ×2 IMPLANT
TOWEL OR 17X26 10 PK STRL BLUE (TOWEL DISPOSABLE) ×2 IMPLANT
TOWEL OR NON WOVEN STRL DISP B (DISPOSABLE) ×2 IMPLANT
TRAY LAP CHOLE (CUSTOM PROCEDURE TRAY) ×2 IMPLANT
TROCAR BLADELESS OPT 5 100 (ENDOMECHANICALS) ×2 IMPLANT
TROCAR XCEL BLUNT TIP 100MML (ENDOMECHANICALS) ×2 IMPLANT
TROCAR XCEL NON-BLD 11X100MML (ENDOMECHANICALS) ×2 IMPLANT
TUBING INSUFFLATION 10FT LAP (TUBING) ×2 IMPLANT

## 2013-12-10 NOTE — ED Notes (Signed)
Per EMS - pt from home w/ c/o epigastric pain that began approx 2130 this evening, pt denies pain at present - pt also experiencing n/v/d - x6 episodes of vomiting today, x1 episode diarrhea yesterday. Pt admits to chills however denies any known fever. Pt A&Ox4

## 2013-12-10 NOTE — ED Notes (Signed)
Bed: WA09 Expected date:  Expected time:  Means of arrival:  Comments: EMS/80 yo female with N/V/D epigastric pain x 2 1/2 hours

## 2013-12-10 NOTE — H&P (Signed)
Wendy Andrews is an 80 y.o. female.   Chief Complaint:   Acute upper abdominal pain HPI: At 10 PM last night, she developed acute tightness in the epigastric region that persisted.  This was after she ate hot chicken wings and okra that she got from [place]. The pain progressively worsened. She had some dry heaves with it. She presented to the emergency department, via EMS, for evaluation. She was noted to have a leukocytosis. A CT scan demonstrated a mildly thickened gallbladder wall with trace pericholecystic fluid and some sludge. Liver function tests and lipase are normal. Troponin was normal. Urinalysis demonstrated a pyuria and bacteruria. Subsequently, an ultrasound was performed which was more definitive and the CT scan. It demonstrated multiple gallstones with sludge, edematous thickened gallbladder wall, and pericholecystic fluid. She had been seen previously because of gallstones but did not have surgery at that time. She lives alone.  Past Medical History  Diagnosis Date  . Hypertension   . Interstitial cystitis   . Hyperlipidemia   . Insomnia     Hypothyroidism   Anxiety disorder  Past Surgical History  Procedure Laterality Date  . TAH/BSO      Family History  Problem Relation Age of Onset  . Heart failure Mother   . Heart failure Father   . Cancer Sister   . Cancer Brother     lung  . Cancer Brother     brain   Social History:  reports that she has never smoked. She does not have any smokeless tobacco history on file. She reports that she does not drink alcohol or use illicit drugs.  Allergies:  Allergies  Allergen Reactions  . Tetanus Toxoids     unknown  . Penicillins Hives and Rash    Prior to Admission medications   Medication Sig Start Date End Date Taking? Authorizing Provider  acetaminophen (TYLENOL) 325 MG tablet Take 650 mg by mouth every 6 (six) hours as needed. For pain.   Yes Historical Provider, MD  amLODipine (NORVASC) 10 MG tablet Take 10 mg  by mouth daily.   Yes Historical Provider, MD  estrogens, conjugated, (PREMARIN) 0.625 MG tablet Take 1.25 mg by mouth daily.   Yes Historical Provider, MD  levothyroxine (SYNTHROID, LEVOTHROID) 75 MCG tablet Take 75 mcg by mouth daily.   Yes Historical Provider, MD  lisinopril (PRINIVIL,ZESTRIL) 10 MG tablet Take 10 mg by mouth daily.   Yes Historical Provider, MD  traZODone (DESYREL) 50 MG tablet Take 50 mg by mouth at bedtime.   Yes Historical Provider, MD     Results for orders placed during the hospital encounter of 12/10/13 (from the past 48 hour(s))  CBC WITH DIFFERENTIAL     Status: Abnormal   Collection Time    12/10/13  1:10 AM      Result Value Ref Range   WBC 15.7 (*) 4.0 - 10.5 K/uL   RBC 5.08  3.87 - 5.11 MIL/uL   Hemoglobin 13.7  12.0 - 15.0 g/dL   HCT 41.5  36.0 - 46.0 %   MCV 81.7  78.0 - 100.0 fL   MCH 27.0  26.0 - 34.0 pg   MCHC 33.0  30.0 - 36.0 g/dL   RDW 14.8  11.5 - 15.5 %   Platelets 238  150 - 400 K/uL   Neutrophils Relative % 83 (*) 43 - 77 %   Neutro Abs 13.0 (*) 1.7 - 7.7 K/uL   Lymphocytes Relative 10 (*) 12 - 46 %  Lymphs Abs 1.5  0.7 - 4.0 K/uL   Monocytes Relative 7  3 - 12 %   Monocytes Absolute 1.0  0.1 - 1.0 K/uL   Eosinophils Relative 1  0 - 5 %   Eosinophils Absolute 0.1  0.0 - 0.7 K/uL   Basophils Relative 0  0 - 1 %   Basophils Absolute 0.0  0.0 - 0.1 K/uL  COMPREHENSIVE METABOLIC PANEL     Status: Abnormal   Collection Time    12/10/13  1:10 AM      Result Value Ref Range   Sodium 139  137 - 147 mEq/L   Potassium 3.7  3.7 - 5.3 mEq/L   Chloride 100  96 - 112 mEq/L   CO2 21  19 - 32 mEq/L   Glucose, Bld 131 (*) 70 - 99 mg/dL   BUN 14  6 - 23 mg/dL   Creatinine, Ser 0.94  0.50 - 1.10 mg/dL   Calcium 9.4  8.4 - 10.5 mg/dL   Total Protein 7.3  6.0 - 8.3 g/dL   Albumin 3.6  3.5 - 5.2 g/dL   AST 27  0 - 37 U/L   ALT 11  0 - 35 U/L   Alkaline Phosphatase 66  39 - 117 U/L   Total Bilirubin 0.3  0.3 - 1.2 mg/dL   GFR calc non Af Amer  55 (*) >90 mL/min   GFR calc Af Amer 64 (*) >90 mL/min   Comment: (NOTE)     The eGFR has been calculated using the CKD EPI equation.     This calculation has not been validated in all clinical situations.     eGFR's persistently <90 mL/min signify possible Chronic Kidney     Disease.  LIPASE, BLOOD     Status: None   Collection Time    12/10/13  1:10 AM      Result Value Ref Range   Lipase 43  11 - 59 U/L  URINALYSIS, ROUTINE W REFLEX MICROSCOPIC     Status: Abnormal   Collection Time    12/10/13  1:36 AM      Result Value Ref Range   Color, Urine YELLOW  YELLOW   APPearance CLOUDY (*) CLEAR   Specific Gravity, Urine 1.025  1.005 - 1.030   pH 6.0  5.0 - 8.0   Glucose, UA NEGATIVE  NEGATIVE mg/dL   Hgb urine dipstick MODERATE (*) NEGATIVE   Bilirubin Urine NEGATIVE  NEGATIVE   Ketones, ur NEGATIVE  NEGATIVE mg/dL   Protein, ur 30 (*) NEGATIVE mg/dL   Urobilinogen, UA 0.2  0.0 - 1.0 mg/dL   Nitrite POSITIVE (*) NEGATIVE   Leukocytes, UA MODERATE (*) NEGATIVE  URINE MICROSCOPIC-ADD ON     Status: Abnormal   Collection Time    12/10/13  1:36 AM      Result Value Ref Range   Squamous Epithelial / LPF FEW (*) RARE   WBC, UA TOO NUMEROUS TO COUNT  <3 WBC/hpf   RBC / HPF 7-10  <3 RBC/hpf   Bacteria, UA MANY (*) RARE  I-STAT TROPOININ, ED     Status: None   Collection Time    12/10/13  1:56 AM      Result Value Ref Range   Troponin i, poc 0.01  0.00 - 0.08 ng/mL   Comment 3            Comment: Due to the release kinetics of cTnI,     a negative result  within the first hours     of the onset of symptoms does not rule out     myocardial infarction with certainty.     If myocardial infarction is still suspected,     repeat the test at appropriate intervals.   US Abdomen Complete  12/10/2013   CLINICAL DATA:  Pain.  EXAM: ULTRASOUND ABDOMEN COMPLETE  COMPARISON:  CT ABD/PELVIS W CM dated 12/10/2013  FINDINGS: Gallbladder:  Gallbladder is distended. Layering stones and sludge. A  edematous, thickened wall measuring 7 mm. There is pericholecystic fluid. Unable to assess for sonographic Murphy sign due to pain medications.  Common bile duct:  Diameter: Normal caliber, 5 mm  Liver:  No focal lesion identified. Within normal limits in parenchymal echogenicity.  IVC:  No abnormality visualized.  Pancreas:  Visualized portion unremarkable.  Spleen:  Size and appearance within normal limits.  Right Kidney:  Length: 10.4 cm. Echogenicity within normal limits. No mass or hydronephrosis visualized.  Left Kidney:  Length: 10.6 cm. Echogenicity within normal limits. No mass or hydronephrosis visualized.  Abdominal aorta:  No aneurysm visualized.  Other findings:  None.  IMPRESSION: Distended gallbladder with layering sludge and stones. Thickened, edematous gallbladder wall with pericholecystic fluid. Findings compatible with acute cholecystitis.   Electronically Signed   By: Rolm Baptise M.D.   On: 12/10/2013 06:03   Ct Abdomen Pelvis W Contrast  12/10/2013   CLINICAL DATA:  Periumbilical pain, nausea, vomiting and diarrhea. Leukocytosis.  EXAM: CT ABDOMEN AND PELVIS WITH CONTRAST  TECHNIQUE: Multidetector CT imaging of the abdomen and pelvis was performed using the standard protocol following bolus administration of intravenous contrast.  CONTRAST:  121m OMNIPAQUE IOHEXOL 300 MG/ML  SOLN  COMPARISON:  CT ABD/PELVIS W CM dated 09/06/2012  FINDINGS: Included view of the lung bases remain clear. The visualized heart and pericardium are unremarkable.  The gallbladder is distended with mild wall thickening and trace pericholecystic fluid. Punctate layering calcifications in the gallbladder. Common bile duct is 6 mm, no CT findings of choledocholithiasis.  Trace fatty liver about the falciform ligament, with overall decrease steatosis from prior CT. The spleen, pancreas, adrenal glands are unremarkable and unchanged.  Trace hiatal hernia. Stomach, small and large bowel are normal in course and caliber  without wall thickening nor inflammatory changes. Mild sigmoid diverticulosis. Contrast has yet to reach the large bowel. No intraperitoneal free air.  Kidneys are unremarkable. Delayed imaging demonstrates symmetric prompt excretion of contrast into the proximal urinary collecting system. Aortoiliac vessels are in normal in course and caliber with moderate calcific atherosclerosis. Status post hysterectomy. Urinary bladder is partially distended and unremarkable.  Soft tissues. Are nonsuspicious. Status post L4-5 PLIF, with partial incorporated bone graft material. Stable mild L1 compression fracture.  IMPRESSION: Acute cholecystitis.  Diverticulosis without CT findings of acute diverticulitis.   Electronically Signed   By: CElon Alas  On: 12/10/2013 04:17    Review of Systems  Constitutional: Positive for chills. Negative for fever and weight loss.  HENT: Negative for congestion and sore throat.   Respiratory: Negative for shortness of breath.   Cardiovascular: Negative for chest pain.  Gastrointestinal: Positive for nausea, vomiting, abdominal pain and diarrhea.  Genitourinary:       Slightly foul odor of the urine.  Neurological: Negative for seizures.  Endo/Heme/Allergies: Does not bruise/bleed easily.    Blood pressure 161/84, pulse 72, temperature 97.9 F (36.6 C), temperature source Oral, resp. rate 16, height 5' 4"  (1.626 m), weight 182 lb (82.555  kg), last menstrual period 10/22/1959, SpO2 96.00%. Physical Exam  Constitutional:  Overweight elderly female in no acute distress.  HENT:  Head: Normocephalic and atraumatic.  Eyes: EOM are normal. No scleral icterus.  Neck: Neck supple.  Cardiovascular: Normal rate and regular rhythm.   Respiratory: Effort normal and breath sounds normal.  GI: Soft. She exhibits no distension and no mass. There is no tenderness.  Limited lower midline scar.  Musculoskeletal: She exhibits edema.  Lymphadenopathy:    She has no cervical  adenopathy.  Neurological: She is alert.  Skin: Skin is warm and dry.  Psychiatric: She has a normal mood and affect. Her behavior is normal.     Assessment/Plan 1.  Acute calculus cholecystitis with no evidence of sepsis.  2. Urinary tract infection  Plan: Admit to the hospital, start IV antibiotics, laparoscopic possible open cholecystectomy this admission.  I have explained the procedure, risks, and aftercare of cholecystectomy.  Risks include but are not limited to bleeding, infection, wound problems, anesthesia, diarrhea, bile leak, injury to common bile duct/liver/intestine, re-operation.  She seems to understand and agrees with the plan.   Breshay Ilg J 12/10/2013, 7:06 AM

## 2013-12-10 NOTE — ED Provider Notes (Signed)
CSN: [id number]     Arrival date & time 12/10/13  0105 History   First MD Initiated Contact with Patient 12/10/13 0132     Chief Complaint  Patient presents with  . Abdominal Pain     (Consider location/radiation/quality/duration/timing/severity/associated sxs/prior Treatment) Patient is a 80 y.o. female presenting with abdominal pain. The history is provided by the patient.  Abdominal Pain Pain location:  Periumbilical Pain quality: cramping   Pain radiates to:  Does not radiate Pain severity:  Severe Onset quality:  Sudden Timing:  Constant Progression:  Unchanged Chronicity:  New Context: sick contacts   Relieved by:  Nothing Worsened by:  Nothing tried Ineffective treatments:  None tried Associated symptoms: diarrhea, nausea and vomiting   Associated symptoms: no chest pain and no shortness of breath   Diarrhea:    Quality:  Watery   Severity:  Moderate   Timing:  Intermittent   Progression:  Partially resolved Nausea:    Severity:  Moderate   Onset quality:  Sudden   Timing:  Constant Risk factors: being elderly   Risk factors: no alcohol abuse     Past Medical History  Diagnosis Date  . Hypertension   . Interstitial cystitis   . Hyperlipidemia   . Insomnia    Past Surgical History  Procedure Laterality Date  . Abdominal hysterectomy     Family History  Problem Relation Age of Onset  . Heart failure Mother   . Heart failure Father   . Cancer Sister   . Cancer Brother     lung  . Cancer Brother     brain   History  Substance Use Topics  . Smoking status: Never Smoker   . Smokeless tobacco: Not on file  . Alcohol Use: No   OB History   Grav Para Term Preterm Abortions TAB SAB Ect Mult Living                 Review of Systems  Respiratory: Negative for shortness of breath.   Cardiovascular: Negative for chest pain.  Gastrointestinal: Positive for nausea, vomiting, abdominal pain and diarrhea.  All other systems reviewed and are  negative.      Allergies  Tetanus toxoids and Penicillins  Home Medications   Current Outpatient Rx  Name  Route  Sig  Dispense  Refill  . acetaminophen (TYLENOL) 325 MG tablet   Oral   Take 650 mg by mouth every 6 (six) hours as needed. For pain.         Marland Kitchen amLODipine (NORVASC) 10 MG tablet   Oral   Take 10 mg by mouth daily.         Marland Kitchen estrogens, conjugated, (PREMARIN) 0.625 MG tablet   Oral   Take 1.25 mg by mouth daily.         Marland Kitchen levothyroxine (SYNTHROID, LEVOTHROID) 75 MCG tablet   Oral   Take 75 mcg by mouth daily.         Marland Kitchen lisinopril (PRINIVIL,ZESTRIL) 10 MG tablet   Oral   Take 10 mg by mouth daily.         . traZODone (DESYREL) 50 MG tablet   Oral   Take 50 mg by mouth at bedtime.          BP 161/84  Pulse 78  Temp(Src) 97.3 F (36.3 C) (Oral)  Resp 16  Ht 5\' 4"  (1.626 m)  Wt 182 lb (82.555 kg)  BMI 31.22 kg/m2  SpO2 98%  LMP 10/22/1959 Physical  Exam  Constitutional: She is oriented to person, place, and time. She appears well-developed and well-nourished. No distress.  HENT:  Head: Normocephalic and atraumatic.  Mouth/Throat: Oropharynx is clear and moist.  Eyes: Conjunctivae are normal. Pupils are equal, round, and reactive to light.  Neck: Normal range of motion. Neck supple.  Cardiovascular: Normal rate, regular rhythm and intact distal pulses.   Pulmonary/Chest: Effort normal and breath sounds normal. She has no wheezes. She has no rales.  Abdominal: Soft. Bowel sounds are increased. There is no rigidity, no guarding, no tenderness at McBurney's point and negative Murphy's sign.  Musculoskeletal: Normal range of motion.  Neurological: She is alert and oriented to person, place, and time.  Skin: Skin is warm and dry.  Psychiatric: She has a normal mood and affect.    ED Course  Procedures (including critical care time) Labs Review Labs Reviewed  CBC WITH DIFFERENTIAL - Abnormal; Notable for the following:    WBC 15.7 (*)     Neutrophils Relative % 83 (*)    Neutro Abs 13.0 (*)    Lymphocytes Relative 10 (*)    All other components within normal limits  COMPREHENSIVE METABOLIC PANEL - Abnormal; Notable for the following:    Glucose, Bld 131 (*)    GFR calc non Af Amer 55 (*)    GFR calc Af Amer 64 (*)    All other components within normal limits  URINALYSIS, ROUTINE W REFLEX MICROSCOPIC - Abnormal; Notable for the following:    APPearance CLOUDY (*)    Hgb urine dipstick MODERATE (*)    Protein, ur 30 (*)    Nitrite POSITIVE (*)    Leukocytes, UA MODERATE (*)    All other components within normal limits  URINE MICROSCOPIC-ADD ON - Abnormal; Notable for the following:    Squamous Epithelial / LPF FEW (*)    Bacteria, UA MANY (*)    All other components within normal limits  LIPASE, BLOOD  I-STAT TROPOININ, ED   Imaging Review No results found.  EKG Interpretation    Date/Time:  Friday December 10 2013 01:25:36 EST Ventricular Rate:  75 PR Interval:  169 QRS Duration: 80 QT Interval:  409 QTC Calculation: 457 R Axis:   -9 Text Interpretation:  Sinus rhythm Confirmed by [facility]  MD, Sabrin Dunlevy ([id number]) on 12/10/2013 1:47:58 AM            MDM   Final diagnoses:  None    Doubt cardiac etiology as symptoms are clearly in the abdomen.  4 am Case d/w Dr. Purnell Shoemakerosenbauer, [facility] to see patient please give cipro and obtain ultrasound  Will sign out to morning EDP pending surgical evaluation    Kermit Arnette K Reeya Bound-Rasch, MD 12/10/13 [phone]

## 2013-12-10 NOTE — Progress Notes (Signed)
UR completed 

## 2013-12-10 NOTE — Op Note (Signed)
12/10/2013  10:59 AM  PATIENT:  Wendy Andrews, 80 y.o., female, MRN: [id number]  PREOP DIAGNOSIS:  Cholelithiasis, cholecystitis  POSTOP DIAGNOSIS:   Acute edematous cholecystitis, cholelithiasis  PROCEDURE:   Procedure(s): LAPAROSCOPIC CHOLECYSTECTOMY WITH INTRAOPERATIVE CHOLANGIOGRAM  SURGEON:   Ovidio Kinavid Leevon Upperman, M.D.  ASSISTANT:   None  ANESTHESIA:   general  Anesthesiologist: Gaylan GeroldJohn R Germeroth, MD CRNA: Elesa MassedJoseph A Joyce, CRNA  General  ASA: @asa @  EBL:  Minimal  ml  BLOOD ADMINISTERED: none  DRAINS: none   LOCAL MEDICATIONS USED:   30 cc 1/4% marcaine  SPECIMEN:   Gall bladder  COUNTS CORRECT:  YES  INDICATIONS FOR PROCEDURE:  Wendy Andrews is a 80 y.o. (DOB: 11/28/1929) white  female whose primary care physician is ROBERTS, Vernie AmmonsONALD WAYNE, MD and comes for cholecystectomy.   The indications and risks of the gall bladder surgery were explained to the patient.  The risks include, but are not limited to, infection, bleeding, common bile duct injury and open surgery.  SURGERY:  The patient was taken to room #6 at [facility].  The abdomen was prepped with chloroprep.  The patient was given Cipro and Flagyl in the ER.   A time out was held and the surgical checklist run.   An infraumbilical incision was made into the abdominal cavity.  A 12 mm Hasson trocar was inserted into the abdominal cavity through the infraumbilical incision and secured with a 0 Vicryl suture.  Three additional trocars were inserted: a 10 mm trocar in the sub-xiphoid location, a 5 mm trocar in the right mid subcostal area, and a 5 mm trocar in the right lateral subcostal area.   The abdomen was explored and the liver, stomach, and bowel that could be seen were unremarkable.   The gall bladder was edematous consistent with acute cholecystitis.  I  Grasped the gall bladder and rotated it cephalad.  Disssection was carried down to the gall bladder/cystic duct junction and the cystic duct isolated.  A  clip was placed on the gall bladder side of the cystic duct.   An intra-operative cholangiogram was shot.   The intra-operative cholangiogram was shot using a cut off Taut catheter placed through a 14 gauge angiocath in the RUQ.  The Taut catheter was inserted in the cut cystic duct and secured with an endoclip.  A cholangiogram was shot with 8 cc of 1/2 strength Omnipaque.  Using fluoroscopy, the cholangiogram showed the flow of contrast into the common bile duct, up the hepatic radicals, and into the duodenum.  There was no mass or obstruction.  This was a normal intra-operative cholangiogram.   The Taut catheter was removed.  The cystic duct was tripley endoclipped and the cystic artery was identified and clipped.  The gall bladder was bluntly and sharpley dissected from the gall bladder bed.   After the gall bladder was removed from the liver, the gall bladder bed and [place] were inspected.  There was no bleeding or bile leak.  The gall bladder was placed in a endocatch bag and delivered through the umbilicus.  The abdomen was irrigated with 1,000 cc saline.   The trocars were then removed.  I infiltrated 30 cc of 1/4% Marcaine into the incisions.  The umbilical port closed with a 0 Vicryl suture and the skin closed with 5-0 vicryl.  The skin was painted with Dermabond.  The patient's sponge and needle count were correct.  The patient was transported to the RR in good condition.  Alphonsa Overall, MD, [facility] Pager: [phone] Office phone:  [phone]

## 2013-12-10 NOTE — Progress Notes (Addendum)
The patient was seen earlier by Dr. Abbey Chattersosenbower for cholecystitis.  She had seen Dr. Biagio QuintLayton in 09/2012 for gall bladder disease, but did not remember much about that encounter.  She is divorced, has no children, has 3 nieces who she depends on.  I discussed with the patient the indications and risks of gall bladder surgery.  The primary risks of gall bladder surgery include, but are not limited to, bleeding, infection, common bile duct injury, and open surgery.  There is also the risk that the patient may have continued symptoms after surgery.  However, the likelihood of improvement in symptoms and return to the patient's normal status is good. We discussed the typical post-operative recovery course. I tried to answer the patient's questions.  I spoke with niece, Cyril LoosenCatherine Bottomly - [phone]- [phone].  Will proceed with cholecystectomy today.  Ovidio Kinavid Rania Prothero, MD, [facility] Pager: [phone] Office phone:  [phone]

## 2013-12-10 NOTE — Transfer of Care (Signed)
Immediate Anesthesia Transfer of Care Note  Patient: Wendy LeavellLillian B Andrews  Procedure(s) Performed: Procedure(s): LAPAROSCOPIC CHOLECYSTECTOMY WITH INTRAOPERATIVE CHOLANGIOGRAM (N/A)  Patient Location: PACU  Anesthesia Type:General  Level of Consciousness: awake, sedated and patient cooperative  Airway & Oxygen Therapy: Patient Spontanous Breathing and Patient connected to face mask oxygen  Post-op Assessment: Report given to PACU RN and Post -op Vital signs reviewed and stable  Post vital signs: Reviewed and stable  Complications: No apparent anesthesia complications

## 2013-12-10 NOTE — Anesthesia Postprocedure Evaluation (Signed)
Anesthesia Post Note  Patient: Wendy LeavellLillian B Sample  Procedure(s) Performed: Procedure(s) (LRB): LAPAROSCOPIC CHOLECYSTECTOMY WITH INTRAOPERATIVE CHOLANGIOGRAM (N/A)  Anesthesia type: General  Patient location: PACU  Post pain: Pain level controlled  Post assessment: Post-op Vital signs reviewed  Last Vitals: BP 184/74  Pulse 95  Temp(Src) 37.1 C (Oral)  Resp 18  Ht 5\' 4"  (1.626 m)  Wt 185 lb 3.2 oz (84.006 kg)  BMI 31.77 kg/m2  SpO2 96%  LMP 10/22/1959  Post vital signs: Reviewed  Level of consciousness: sedated  Complications: No apparent anesthesia complications

## 2013-12-10 NOTE — Anesthesia Preprocedure Evaluation (Signed)
Anesthesia Evaluation  Patient identified by MRN, date of birth, ID band Patient awake    Reviewed: Allergy & Precautions, H&P , NPO status , Patient's Chart, lab work & pertinent test results  Airway Mallampati: II TM Distance: >3 FB Neck ROM: Full    Dental  (+) Dental Advisory Given   Pulmonary neg pulmonary ROS,  breath sounds clear to auscultation- rhonchi        Cardiovascular hypertension, Pt. on medications Rhythm:Regular Rate:Normal     Neuro/Psych negative neurological ROS  negative psych ROS   GI/Hepatic negative GI ROS, Neg liver ROS,   Endo/Other  negative endocrine ROS  Renal/GU negative Renal ROS     Musculoskeletal negative musculoskeletal ROS (+)   Abdominal   Peds  Hematology negative hematology ROS (+)   Anesthesia Other Findings   Reproductive/Obstetrics                           Anesthesia Physical Anesthesia Plan  ASA: II  Anesthesia Plan: General   Post-op Pain Management:    Induction: Intravenous  Airway Management Planned: Oral ETT  Additional Equipment:   Intra-op Plan:   Post-operative Plan: Extubation in OR  Informed Consent: I have reviewed the patients History and Physical, chart, labs and discussed the procedure including the risks, benefits and alternatives for the proposed anesthesia with the patient or authorized representative who has indicated his/her understanding and acceptance.   Dental advisory given  Plan Discussed with: CRNA  Anesthesia Plan Comments:         Anesthesia Quick Evaluation

## 2013-12-11 MED ORDER — PANTOPRAZOLE SODIUM 20 MG PO TBEC
20.0000 mg | DELAYED_RELEASE_TABLET | Freq: Every day | ORAL | Status: DC
  Administered 2013-12-11 – 2013-12-12 (×2): 20 mg via ORAL
  Filled 2013-12-11 (×2): qty 1

## 2013-12-11 MED ORDER — AMLODIPINE BESYLATE 10 MG PO TABS
10.0000 mg | ORAL_TABLET | Freq: Every day | ORAL | Status: DC
  Administered 2013-12-11 – 2013-12-12 (×2): 10 mg via ORAL
  Filled 2013-12-11 (×2): qty 1

## 2013-12-11 MED ORDER — LISINOPRIL 10 MG PO TABS
10.0000 mg | ORAL_TABLET | Freq: Every day | ORAL | Status: DC
  Administered 2013-12-11 – 2013-12-12 (×2): 10 mg via ORAL
  Filled 2013-12-11 (×2): qty 1

## 2013-12-11 NOTE — Progress Notes (Signed)
Dr Daphine DeutscherMartin notified of pt's elevated BP. BP 183/78 p=79. New order to restart home lisinopril and norvasc.

## 2013-12-11 NOTE — Progress Notes (Signed)
Patient ID: Wendy LeavellLillian B Andrews, female   DOB: 03/08/1930, 80 y.o.   MRN: [id number]  General [facility] - [facility] - Progress Note  POD# 1  Subjective: Patient complains of abdominal "soreness".  Taking clear liquids but has not tried solid food yet.  Ambulated last night but not today.  Worried about going home - lives alone.  Objective: Vital signs in last 24 hours: Temp:  [97 F (36.1 C)-98.7 F (37.1 C)] 98.6 F (37 C) (02/21 0601) Pulse Rate:  [75-124] 75 (02/21 0601) Resp:  [12-22] 18 (02/21 0601) BP: (142-193)/(65-86) 175/78 mmHg (02/21 0601) SpO2:  [94 %-100 %] 94 % (02/21 0601) Weight:  [185 lb 3.2 oz (84.006 kg)] 185 lb 3.2 oz (84.006 kg) (02/20 1230) Last BM Date: 12/09/13  Intake/Output from previous day: 02/20 0701 - 02/21 0700 In: 2728.8 [I.V.:2428.8; IV Piggyback:300] Out: 2000 [Urine:2000]  Exam: HEENT - clear, not icteric Neck - soft Chest - clear bilaterally Cor - RRR, no murmur Abd - protuberant, mild tenderness; wounds clear and dry and intacta Ext - no significant edema Neuro - grossly intact, no focal deficits  Lab Results:   Recent Labs  12/10/13 0110  WBC 15.7*  HGB 13.7  HCT 41.5  PLT 238     Recent Labs  12/10/13 0110  NA 139  K 3.7  CL 100  CO2 21  GLUCOSE 131*  BUN 14  CREATININE 0.94  CALCIUM 9.4    Studies/Results: Dg Chest 2 View  12/10/2013   CLINICAL DATA:  Preoperative examination (cholecystectomy)  EXAM: CHEST  2 VIEW  COMPARISON:  DG CHEST 2 VIEW dated 08/05/2010; DG CHEST 2 VIEW dated 03/05/2008; DG CHEST 2 VIEW dated 02/29/2008  FINDINGS: Grossly unchanged cardiac silhouette and mediastinal contours. There is mild elevation / eventration of the right hemidiaphragm. There is mild diffuse slightly nodular thickening of the pulmonary position. No focal airspace opacities. No pleural effusion or pneumothorax. No evidence of edema. Grossly unchanged mild (approximately 25%) compression deformity of a lower  thoracic / upper lumbar vertebral body with associated focal kyphosis at this level. Enteric contrast is seen within the splenic flexure of the colon. Degenerative change of the bilateral glenohumeral joints.  IMPRESSION: Mild bronchitic change without acute cardiopulmonary disease.   Electronically Signed   By: Simonne ComeJohn  Watts M.D.   On: 12/10/2013 07:37   Dg Cholangiogram Operative  12/10/2013   CLINICAL DATA:  Cholelithiasis  EXAM: INTRAOPERATIVE CHOLANGIOGRAM  TECHNIQUE: Cholangiographic images from the C-arm fluoroscopic device were submitted for interpretation post-operatively. Please see the procedural report for the amount of contrast and the fluoroscopy time utilized.  COMPARISON:  None.  FINDINGS: Injection in the cystic duct remnant reveals opacification of the intrahepatic and extrahepatic biliary tree. Free flow contrast material into the duodenum is noted. A questionable filling defect is noted within the distal common bile duct. It is not well seen on the later images suggesting that it may be artifactual in nature.   Electronically Signed   By: Alcide CleverMark  Lukens M.D.   On: 12/10/2013 10:43   [place] Abdomen Complete  12/10/2013   CLINICAL DATA:  Pain.  EXAM: ULTRASOUND ABDOMEN COMPLETE  COMPARISON:  CT ABD/PELVIS W CM dated 12/10/2013  FINDINGS: Gallbladder:  Gallbladder is distended. Layering stones and sludge. A edematous, thickened wall measuring 7 mm. There is pericholecystic fluid. Unable to assess for sonographic Murphy sign due to pain medications.  Common bile duct:  Diameter: Normal caliber, 5 mm  Liver:  No focal  lesion identified. Within normal limits in parenchymal echogenicity.  IVC:  No abnormality visualized.  Pancreas:  Visualized portion unremarkable.  Spleen:  Size and appearance within normal limits.  Right Kidney:  Length: 10.4 cm. Echogenicity within normal limits. No mass or hydronephrosis visualized.  Left Kidney:  Length: 10.6 cm. Echogenicity within normal limits. No mass or  hydronephrosis visualized.  Abdominal aorta:  No aneurysm visualized.  Other findings:  None.  IMPRESSION: Distended gallbladder with layering sludge and stones. Thickened, edematous gallbladder wall with pericholecystic fluid. Findings compatible with acute cholecystitis.   Electronically Signed   By: Charlett Nose M.D.   On: 12/10/2013 06:03   Ct Abdomen Pelvis W Contrast  12/10/2013   CLINICAL DATA:  Periumbilical pain, nausea, vomiting and diarrhea. Leukocytosis.  EXAM: CT ABDOMEN AND PELVIS WITH CONTRAST  TECHNIQUE: Multidetector CT imaging of the abdomen and pelvis was performed using the standard protocol following bolus administration of intravenous contrast.  CONTRAST:  OMNIPAQUE IOHEXOL 300 MG/ML  SOLN  COMPARISON:  CT ABD/PELVIS W CM dated 09/06/2012  FINDINGS: Included view of the lung bases remain clear. The visualized heart and pericardium are unremarkable.  The gallbladder is distended with mild wall thickening and trace pericholecystic fluid. Punctate layering calcifications in the gallbladder. Common bile duct is 6 mm, no CT findings of choledocholithiasis.  Trace fatty liver about the falciform ligament, with overall decrease steatosis from prior CT. The spleen, pancreas, adrenal glands are unremarkable and unchanged.  Trace hiatal hernia. Stomach, small and large bowel are normal in course and caliber without wall thickening nor inflammatory changes. Mild sigmoid diverticulosis. Contrast has yet to reach the large bowel. No intraperitoneal free air.  Kidneys are unremarkable. Delayed imaging demonstrates symmetric prompt excretion of contrast into the proximal urinary collecting system. Aortoiliac vessels are in normal in course and caliber with moderate calcific atherosclerosis. Status post hysterectomy. Urinary bladder is partially distended and unremarkable.  Soft tissues. Are nonsuspicious. Status post L4-5 PLIF, with partial incorporated bone graft material. Stable mild L1 compression  fracture.  IMPRESSION: Acute cholecystitis.  Diverticulosis without CT findings of acute diverticulitis.   Electronically Signed   By: Awilda Metro   On: 12/10/2013 04:17    Assessment / Plan: 1.  Status post lap chole with IOC  Advance to regular diet  Ambulate in halls  Pain Rx - oral meds  May go home later today or in AM 12/12/2013   Velora Heckler, MD, [facility] Office: [phone]  12/11/2013

## 2013-12-12 DIAGNOSIS — Z9049 Acquired absence of other specified parts of digestive tract: Secondary | ICD-10-CM

## 2013-12-12 MED ORDER — HYDROCODONE-ACETAMINOPHEN 5-325 MG PO TABS: 1.0000 | ORAL_TABLET | ORAL | Status: DC | PRN

## 2013-12-12 NOTE — Discharge Instructions (Signed)
Laparoscopic Cholecystectomy °Laparoscopic cholecystectomy is surgery to remove the gallbladder. The gallbladder is located in the upper right part of the abdomen, behind the liver. It is a storage sac for bile produced in the liver. Bile aids in the digestion and absorption of fats. Cholecystectomy is often done for inflammation of the gallbladder (cholecystitis). This condition is usually caused by a buildup of gallstones (cholelithiasis) in your gallbladder. Gallstones can block the flow of bile, resulting in inflammation and pain. In severe cases, emergency surgery may be required. When emergency surgery is not required, you will have time to prepare for the procedure. °Laparoscopic surgery is an alternative to open surgery. Laparoscopic surgery has a shorter recovery time. Your common bile duct may also need to be examined during the procedure. If stones are found in the common bile duct, they may be removed. °LET YOUR HEALTH CARE PROVIDER KNOW ABOUT: °· Any allergies you have. °· All medicines you are taking, including vitamins, herbs, eye drops, creams, and over-the-counter medicines. °· Previous problems you or members of your family have had with the use of anesthetics. °· Any blood disorders you have. °· Previous surgeries you have had. °· Medical conditions you have. °RISKS AND COMPLICATIONS °Generally, this is a safe procedure. However, as with any procedure, complications can occur. Possible complications include: °· Infection. °· Damage to the common bile duct, nerves, arteries, veins, or other internal organs such as the stomach, liver, or intestines. °· Bleeding. °· A stone may remain in the common bile duct. °· A bile leak from the cyst duct that is clipped when your gallbladder is removed. °· The need to convert to open surgery, which requires a larger incision in the abdomen. This may be necessary if your surgeon thinks it is not safe to continue with a laparoscopic procedure. °BEFORE THE  PROCEDURE °· Ask your health care provider about changing or stopping any regular medicines. You will need to stop taking aspirin or blood thinners at least 5 days prior to surgery. °· Do not eat or drink anything after midnight the night before surgery. °· Let your health care provider know if you develop a cold or other infectious problem before surgery. °PROCEDURE  °· You will be given medicine to make you sleep through the procedure (general anesthetic). A breathing tube will be placed in your mouth. °· When you are asleep, your surgeon will make several small cuts (incisions) in your abdomen. °· A thin, lighted tube with a tiny camera on the end (laparoscope) is inserted through one of the small incisions. The camera on the laparoscope sends a picture to a TV screen in the operating room. This gives the surgeon a good view inside your abdomen. °· A gas will be pumped into your abdomen. This expands your abdomen so that the surgeon has more room to perform the surgery. °· Other tools needed for the procedure are inserted through the other incisions. The gallbladder is removed through one of the incisions. °· After the removal of your gallbladder, the incisions will be closed with stitches, staples, or skin glue. °AFTER THE PROCEDURE °· You will be taken to a recovery area where your progress will be checked often. °· You may be allowed to go home the same day if your pain is controlled and you can tolerate liquids. °Document Released: 10/07/2005 Document Revised: 07/28/2013 Document Reviewed: 05/19/2013 °Exit[employer]® Patient Information ©2014 [employer]. ° °

## 2013-12-12 NOTE — Progress Notes (Signed)
Assessment unchanged. Pt verbalized understanding of dc instructions through teach back. Explained My Chart yet pt has no computer or smart device. Script x 1 given as provided by MD. Discharged via wc to ED entrance to meet awaiting vehicle to carry home. Accompanied by NT and niece.

## 2013-12-12 NOTE — Plan of Care (Signed)
Problem: Phase II Progression Outcomes Goal: Return of bowel function (flatus, BM) IF ABDOMINAL SURGERY:  Outcome: Progressing Bowel sounds hyperactive. No flatus noted. Denies N/V.

## 2013-12-12 NOTE — Discharge Summary (Signed)
Physician Discharge Summary  Patient ID: Wendy LeavellLillian B Andrews MRN: [id number] DOB/AGE: 78/11/1929 78 y.o.  Admit date: 12/10/2013 Discharge date: 12/12/2013  Admission Diagnoses:  cholecystitis  Discharge Diagnoses:  same  Active Problems:   S/P laparoscopic cholecystectomy Feb 2015   Surgery:  Laparoscopic cholecystectomy  Discharged Condition: improved  Hospital Course:   Had surgery.  Did well but advance slowly.  Read for discharge  Consults: none  Significant Diagnostic Studies: non    Discharge Exam: Blood pressure 184/81, pulse 81, temperature 97.9 F (36.6 C), temperature source Oral, resp. rate 20, height 5\' 4"  (1.626 m), weight 185 lb 3.2 oz (84.006 kg), last menstrual period 10/22/1959, SpO2 93.00%. Incisions covered with Dermabond.  Minimal pain  Disposition: 01-Home or Self Care  Discharge Orders   Future Orders Complete By Expires   Diet - low sodium heart healthy  As directed    Discharge instructions  As directed    Comments:     Advance diet as tolerated.   Increase activity slowly  As directed    No wound care  As directed        Medication List         acetaminophen 325 MG tablet  Commonly known as:  TYLENOL  Take 650 mg by mouth every 6 (six) hours as needed. For pain.     amLODipine 10 MG tablet  Commonly known as:  NORVASC  Take 10 mg by mouth daily.     estrogens (conjugated) 0.625 MG tablet  Commonly known as:  PREMARIN  Take 1.25 mg by mouth daily.     HYDROcodone-acetaminophen 5-325 MG per tablet  Commonly known as:  NORCO/VICODIN  Take 1-2 tablets by mouth every 4 (four) hours as needed for moderate pain.     levothyroxine 75 MCG tablet  Commonly known as:  SYNTHROID, LEVOTHROID  Take 75 mcg by mouth daily.     lisinopril 10 MG tablet  Commonly known as:  PRINIVIL,ZESTRIL  Take 10 mg by mouth daily.     traZODone 50 MG tablet  Commonly known as:  DESYREL  Take 50 mg by mouth at bedtime.           Follow-up Information    Follow up with NEWMAN,DAVID H, MD In 4 weeks.   Specialty:  General Surgery   Contact information:   [address] [place] [address] [phone]       Signed: Valarie MerinoMARTIN,Dwanda Tufano B 12/12/2013, 8:31 AM

## 2013-12-13 ENCOUNTER — Encounter (HOSPITAL_COMMUNITY): Payer: Self-pay | Admitting: Surgery

## 2014-01-07 ENCOUNTER — Encounter (INDEPENDENT_AMBULATORY_CARE_PROVIDER_SITE_OTHER): Payer: Self-pay | Admitting: Surgery

## 2014-01-07 ENCOUNTER — Ambulatory Visit (INDEPENDENT_AMBULATORY_CARE_PROVIDER_SITE_OTHER): Payer: Medicare Other | Admitting: Surgery

## 2014-01-07 VITALS — BP 130/72 | HR 80 | Temp 97.0°F | Resp 14 | Ht 64.5 in | Wt 178.0 lb

## 2014-01-07 DIAGNOSIS — Z9049 Acquired absence of other specified parts of digestive tract: Secondary | ICD-10-CM

## 2014-01-07 DIAGNOSIS — Z9889 Other specified postprocedural states: Secondary | ICD-10-CM

## 2014-01-07 NOTE — Progress Notes (Signed)
[facility]  Ovidio Kinavid Leona Pressly, MD,  FACS [address]  [address] [place]    [id number] Phone:  [phone] FAX:  [phone]   Re:   Wendy LeavellLillian B Andrews DOB:   07/29/1930 MRN:   [id number]  ASSESSMENT AND PLAN: 1.  Lap chole with IOC - 12/10/2013 - D. Ezzard StandingNewman  I gave patient a copy of her path report.  She is to see Dr. Su Hiltoberts in 3 or 4 weeks.  Her return appt to me is PRN. 2. HTN 3. Interstitial cystitis  HISTORY OF PRESENT ILLNESS: Chief Complaint  Patient presents with  . Routine Post Op    1st po reck   Wendy LeavellLillian B Andrews is a 80 y.o. (DOB: 07/20/1930)  white  female who is a patient of ROBERTS, Vernie AmmonsONALD WAYNE, MD and comes to me today for follow up of gall bladder surgery. She has done well with no issues.  Past Medical History  Diagnosis Date  . Hypertension   . Interstitial cystitis   . Hyperlipidemia   . Insomnia    SOCIAL HISTORY: Comes by self  PHYSICAL EXAM: BP 130/72  Pulse 80  Temp(Src) 97 F (36.1 C) (Temporal)  Resp 14  Ht 5' 4.5" (1.638 m)  Wt 178 lb (80.74 kg)  BMI 30.09 kg/m2  LMP 10/22/1959  General: WN Older WF who is alert and generally healthy appearing.  HEENT: Normal. Pupils equal.  Lungs: Clear to auscultation and symmetric breath sounds. Heart:  RRR. No murmur or rub. Abdomen: Soft.  No tenderness. No hernia. Normal bowel sounds.  Incisions look good. Extremities:  Good strength and ROM  in upper and lower extremities.  DATA REVIEWED: Path to patient  Ovidio Kinavid Esvin Hnat, MD,  [facility] [place] [address]  [address]   [place]    [id number] Phone:  [phone] FAX:  [phone]

## 2014-05-03 ENCOUNTER — Encounter (HOSPITAL_COMMUNITY): Payer: Self-pay | Admitting: Emergency Medicine

## 2014-05-03 ENCOUNTER — Emergency Department (HOSPITAL_COMMUNITY): Payer: Medicare Other

## 2014-05-03 ENCOUNTER — Emergency Department (HOSPITAL_COMMUNITY)
Admission: EM | Admit: 2014-05-03 | Discharge: 2014-05-04 | Disposition: A | Discharge status: Home / Self Care | Payer: Medicare Other | Attending: Emergency Medicine | Admitting: Emergency Medicine

## 2014-05-03 DIAGNOSIS — R109 Unspecified abdominal pain: Secondary | ICD-10-CM | POA: Diagnosis present

## 2014-05-03 DIAGNOSIS — M545 Low back pain, unspecified: Secondary | ICD-10-CM | POA: Diagnosis not present

## 2014-05-03 DIAGNOSIS — R112 Nausea with vomiting, unspecified: Secondary | ICD-10-CM | POA: Diagnosis not present

## 2014-05-03 DIAGNOSIS — Z880 Allergy status to penicillin: Secondary | ICD-10-CM | POA: Diagnosis not present

## 2014-05-03 DIAGNOSIS — Z862 Personal history of diseases of the blood and blood-forming organs and certain disorders involving the immune mechanism: Secondary | ICD-10-CM | POA: Insufficient documentation

## 2014-05-03 DIAGNOSIS — Z8639 Personal history of other endocrine, nutritional and metabolic disease: Secondary | ICD-10-CM | POA: Diagnosis not present

## 2014-05-03 DIAGNOSIS — Z90710 Acquired absence of both cervix and uterus: Secondary | ICD-10-CM | POA: Insufficient documentation

## 2014-05-03 DIAGNOSIS — Z87448 Personal history of other diseases of urinary system: Secondary | ICD-10-CM | POA: Insufficient documentation

## 2014-05-03 DIAGNOSIS — Z9089 Acquired absence of other organs: Secondary | ICD-10-CM | POA: Insufficient documentation

## 2014-05-03 DIAGNOSIS — I10 Essential (primary) hypertension: Secondary | ICD-10-CM | POA: Insufficient documentation

## 2014-05-03 DIAGNOSIS — Z79899 Other long term (current) drug therapy: Secondary | ICD-10-CM | POA: Diagnosis not present

## 2014-05-03 DIAGNOSIS — R1030 Lower abdominal pain, unspecified: Secondary | ICD-10-CM

## 2014-05-03 LAB — URINALYSIS, ROUTINE W REFLEX MICROSCOPIC
Bilirubin Urine: NEGATIVE
Glucose, UA: NEGATIVE mg/dL
Ketones, ur: NEGATIVE mg/dL
Leukocytes, UA: NEGATIVE
Nitrite: NEGATIVE
Protein, ur: 30 mg/dL — AB
Specific Gravity, Urine: 1.021 (ref 1.005–1.030)
Urobilinogen, UA: 0.2 mg/dL (ref 0.0–1.0)
pH: 5.5 (ref 5.0–8.0)

## 2014-05-03 LAB — COMPREHENSIVE METABOLIC PANEL
ALT: 20 U/L (ref 0–35)
AST: 21 U/L (ref 0–37)
Albumin: 3.7 g/dL (ref 3.5–5.2)
Alkaline Phosphatase: 95 U/L (ref 39–117)
Anion gap: 19 — ABNORMAL HIGH (ref 5–15)
BUN: 18 mg/dL (ref 6–23)
CO2: 19 mEq/L (ref 19–32)
Calcium: 9.4 mg/dL (ref 8.4–10.5)
Chloride: 100 mEq/L (ref 96–112)
Creatinine, Ser: 0.79 mg/dL (ref 0.50–1.10)
GFR calc Af Amer: 87 mL/min — ABNORMAL LOW (ref >90)
GFR calc non Af Amer: 75 mL/min — ABNORMAL LOW (ref >90)
Glucose, Bld: 143 mg/dL — ABNORMAL HIGH (ref 70–99)
Potassium: 4.3 mEq/L (ref 3.7–5.3)
Sodium: 138 mEq/L (ref 137–147)
Total Bilirubin: 0.3 mg/dL (ref 0.3–1.2)
Total Protein: 7.9 g/dL (ref 6.0–8.3)

## 2014-05-03 LAB — CBC WITH DIFFERENTIAL/PLATELET
Basophils Absolute: 0 10*3/uL (ref 0.0–0.1)
Basophils Relative: 0 % (ref 0–1)
Eosinophils Absolute: 0 10*3/uL (ref 0.0–0.7)
Eosinophils Relative: 0 % (ref 0–5)
HCT: 43.3 % (ref 36.0–46.0)
Hemoglobin: 14.5 g/dL (ref 12.0–15.0)
Lymphocytes Relative: 11 % — ABNORMAL LOW (ref 12–46)
Lymphs Abs: 1.4 10*3/uL (ref 0.7–4.0)
MCH: 27 pg (ref 26.0–34.0)
MCHC: 33.5 g/dL (ref 30.0–36.0)
MCV: 80.6 fL (ref 78.0–100.0)
Monocytes Absolute: 0.5 10*3/uL (ref 0.1–1.0)
Monocytes Relative: 4 % (ref 3–12)
Neutro Abs: 10.7 10*3/uL — ABNORMAL HIGH (ref 1.7–7.7)
Neutrophils Relative %: 85 % — ABNORMAL HIGH (ref 43–77)
Platelets: 243 10*3/uL (ref 150–400)
RBC: 5.37 MIL/uL — ABNORMAL HIGH (ref 3.87–5.11)
RDW: 15.1 % (ref 11.5–15.5)
WBC: 12.6 10*3/uL — ABNORMAL HIGH (ref 4.0–10.5)

## 2014-05-03 LAB — URINE MICROSCOPIC-ADD ON

## 2014-05-03 LAB — LIPASE, BLOOD: Lipase: 23 U/L (ref 11–59)

## 2014-05-03 LAB — I-STAT CG4 LACTIC ACID, ED: Lactic Acid, Venous: 3.37 mmol/L — ABNORMAL HIGH (ref 0.5–2.2)

## 2014-05-03 MED ORDER — IOHEXOL 300 MG/ML  SOLN
100.0000 mL | Freq: Once | INTRAMUSCULAR | Status: AC | PRN
  Administered 2014-05-03: 100 mL via INTRAVENOUS

## 2014-05-03 MED ORDER — SODIUM CHLORIDE 0.9 % IV BOLUS (SEPSIS)
500.0000 mL | Freq: Once | INTRAVENOUS | Status: AC
  Administered 2014-05-03: 500 mL via INTRAVENOUS

## 2014-05-03 MED ORDER — ONDANSETRON HCL 4 MG/2ML IJ SOLN
4.0000 mg | Freq: Once | INTRAMUSCULAR | Status: AC
  Administered 2014-05-03: 4 mg via INTRAVENOUS
  Filled 2014-05-03: qty 2

## 2014-05-03 MED ORDER — ONDANSETRON HCL 4 MG/2ML IJ SOLN: 4.0000 mg | Freq: Once | INTRAMUSCULAR | Status: DC

## 2014-05-03 MED ORDER — MORPHINE SULFATE 4 MG/ML IJ SOLN
4.0000 mg | Freq: Once | INTRAMUSCULAR | Status: AC
  Administered 2014-05-03: 4 mg via INTRAVENOUS
  Filled 2014-05-03: qty 1

## 2014-05-03 NOTE — ED Notes (Signed)
Pt was given ginger ale and crackers per her request.

## 2014-05-03 NOTE — ED Provider Notes (Signed)
CSN: [id number]     Arrival date & time 05/03/14  1952 History   First MD Initiated Contact with Patient 05/03/14 2031     Chief Complaint  Patient presents with  . Abdominal Pain  . Back Pain     (Consider location/radiation/quality/duration/timing/severity/associated sxs/prior Treatment) HPI Comments: Patient presents with complaint of lower dominant pain and vomiting that began at about noon today. Patient had one large episode of vomiting, nonbloody, nonbilious. Patient went to her primary care physician and was diagnosed with a urinary tract infection. She was sent home with an antibiotic. Tonight she continued to have dry heaving and continued pain. She denies fever, chest pain. She has had an occasional cough last night. She's had a runny nose but no sore throat. No diarrhea. Patient has middle lower back pain. History of previous surgery. No skin rash. No risk factors for mesenteric ischemia. Recent cholecystectomy. The onset of this condition was acute. The course is constant. Aggravating factors: palpation. Alleviating factors: none.    The history is provided by the patient.    Past Medical History  Diagnosis Date  . Hypertension   . Interstitial cystitis   . Hyperlipidemia   . Insomnia    Past Surgical History  Procedure Laterality Date  . Abdominal hysterectomy    . Cholecystectomy N/A 12/10/2013    Procedure: LAPAROSCOPIC CHOLECYSTECTOMY WITH INTRAOPERATIVE CHOLANGIOGRAM;  Surgeon: Kandis Cockingavid H Newman, MD;  Location: [facility];  Service: General;  Laterality: N/A;   Family History  Problem Relation Age of Onset  . Heart failure Mother   . Heart failure Father   . Cancer Sister   . Cancer Brother     lung  . Cancer Brother     brain   History  Substance Use Topics  . Smoking status: Never Smoker   . Smokeless tobacco: Not on file  . Alcohol Use: No   OB History   Grav Para Term Preterm Abortions TAB SAB Ect Mult Living                 Review of Systems   Constitutional: Negative for fever.  HENT: Negative for rhinorrhea and sore throat.   Eyes: Negative for redness.  Respiratory: Negative for cough.   Cardiovascular: Negative for chest pain.  Gastrointestinal: Positive for nausea, vomiting and abdominal pain. Negative for diarrhea.  Genitourinary: Negative for dysuria.  Musculoskeletal: Negative for myalgias.  Skin: Negative for rash.  Neurological: Negative for headaches.    Allergies  Tetanus toxoids and Penicillins  Home Medications   Prior to Admission medications   Medication Sig Start Date End Date Taking? Authorizing Provider  amLODipine (NORVASC) 10 MG tablet Take 10 mg by mouth daily.   Yes Historical Provider, MD  estrogens, conjugated, (PREMARIN) 0.625 MG tablet Take 1.25 mg by mouth daily.   Yes Historical Provider, MD  levothyroxine (SYNTHROID, LEVOTHROID) 75 MCG tablet Take 75 mcg by mouth daily.   Yes Historical Provider, MD  lisinopril (PRINIVIL,ZESTRIL) 10 MG tablet Take 10 mg by mouth daily.   Yes Historical Provider, MD  traZODone (DESYREL) 50 MG tablet Take 50 mg by mouth at bedtime.   Yes Historical Provider, MD   BP 200/85  Pulse 80  Temp(Src) 98.4 F (36.9 C) (Oral)  Resp 18  SpO2 100%  LMP 10/22/1959  Physical Exam  Nursing note and vitals reviewed. Constitutional: She appears well-developed and well-nourished.  HENT:  Head: Normocephalic and atraumatic.  Mouth/Throat: No oropharyngeal exudate.  Eyes: Conjunctivae are normal. Right  eye exhibits no discharge. Left eye exhibits no discharge.  Neck: Normal range of motion. Neck supple.  Cardiovascular: Normal rate, regular rhythm and normal heart sounds.   No murmur heard. Pulmonary/Chest: Effort normal and breath sounds normal. No respiratory distress. She has no wheezes. She has no rales.  Abdominal: Soft. Bowel sounds are normal. She exhibits no distension. There is tenderness (mild, suprapubic). There is no rebound and no guarding.   Neurological: She is alert.  Skin: Skin is warm and dry.  Psychiatric: She has a normal mood and affect.    ED Course  Procedures (including critical care time) Labs Review Labs Reviewed  CBC WITH DIFFERENTIAL - Abnormal; Notable for the following:    WBC 12.6 (*)    RBC 5.37 (*)    Neutrophils Relative % 85 (*)    Lymphocytes Relative 11 (*)    Neutro Abs 10.7 (*)    All other components within normal limits  COMPREHENSIVE METABOLIC PANEL - Abnormal; Notable for the following:    Glucose, Bld 143 (*)    GFR calc non Af Amer 75 (*)    GFR calc Af Amer 87 (*)    Anion gap 19 (*)    All other components within normal limits  URINALYSIS, ROUTINE W REFLEX MICROSCOPIC - Abnormal; Notable for the following:    Hgb urine dipstick MODERATE (*)    Protein, ur 30 (*)    All other components within normal limits  URINE MICROSCOPIC-ADD ON - Abnormal; Notable for the following:    Casts HYALINE CASTS (*)    All other components within normal limits  I-STAT CG4 LACTIC ACID, ED - Abnormal; Notable for the following:    Lactic Acid, Venous 3.37 (*)    All other components within normal limits  LIPASE, BLOOD    Imaging Review Ct Abdomen Pelvis W Contrast  05/03/2014   CLINICAL DATA:  Low abdominal pain.  Vomiting.  Elevated lactate  EXAM: CT ABDOMEN AND PELVIS WITH CONTRAST  TECHNIQUE: Multidetector CT imaging of the abdomen and pelvis was performed using the standard protocol following bolus administration of intravenous contrast.  CONTRAST:  OMNIPAQUE IOHEXOL 300 MG/ML  SOLN  COMPARISON:  12/10/2013  FINDINGS: BODY WALL: Unremarkable.  LOWER CHEST: No acute disease.  ABDOMEN/PELVIS:  Liver: No focal abnormality.  Biliary: Cholecystectomy. No evidence of biliary ductal obstruction.  Pancreas: Unremarkable.  Spleen: Unremarkable.  Adrenals: Unremarkable.  Kidneys and ureters: No hydronephrosis or stone.  Bladder: Unremarkable.  Reproductive: Hysterectomy and probable oophorectomies.   Bowel: Apparent colonic wall thickening is likely from underdistention, appearance similar to previous. No pericecal inflammation. Distal colonic diverticulosis. No evidence of bowel necrosis to explain the elevated lactate. No bowel obstruction.  Retroperitoneum: No mass or adenopathy.  Peritoneum: No ascites or pneumoperitoneum.  Vascular: Diffuse aortic and branch vessel atherosclerosis. No major vessel occlusion or notable stenosis to increase concern for mesenteric ischemia.  OSSEOUS: L4-5 posterior lumbar interbody fusion with rod and pedicle screw fixation. There is likely bony fusion at this level. No hardware displacement. Remote L1 superior endplate fracture. No acute osseous findings.  IMPRESSION: No acute intra-abdominal findings.   Electronically Signed   By: Tiburcio Pea M.D.   On: 05/03/2014 22:44     EKG Interpretation None      8:54 PM Patient seen and examined. Work-up initiated. Medications ordered.   Vital signs reviewed and are as follows: Filed Vitals:   05/03/14 1956  BP: 200/85  Pulse: 80  Temp: 98.4 F (  36.9 C)  Resp: 18   Lactate elevated, fluids ordered. CT ordered. Patient states she is feeling better after medication.   CT reviewed. Discussed findings with Dr. Effie Shy who has seen patient. Will give oral fluid trial and ambulate. If she does well, she can be discharged.   Patient has had cranberry juice to drink and has ambulated in hall. Her abdomen remained soft and nontender. No additional vomiting. Patient appears well, nontoxic.  Patient encouraged to followup with her doctor in 24-48 hours for recheck. The patient was urged to return to the Emergency Department immediately with worsening of current symptoms, worsening abdominal pain, persistent vomiting, blood noted in stools, fever, or any other concerns. The patient verbalized understanding.   BP 162/80  Pulse 93  Temp(Src) 98.4 F (36.9 C) (Oral)  Resp 14  SpO2 97%  LMP 10/22/1959   MDM    Final diagnoses:  Non-intractable vomiting with nausea, vomiting of unspecified type  Abdominal pain, suprapubic, unspecified laterality   Patients with abdominal pain and vomiting, now resolved. Mild elevation in lactate treated with fluids. Patient is able to orally hydrate at this time. CT scan ordered and is negative for acute process. Patient has continued to do well in emergency department and appears well, nontoxic. Feel that she can be discharged home to followup with her PCP. Blood pressure improved while in emergency department. She can continue any treatment prescribed by her doctor today, although her UA does not appear infected here. Culture pending.   No dangerous or life-threatening conditions suspected or identified by history, physical exam, and by work-up. No indications for hospitalization identified.      Renne Crigler, PA-C 05/04/14 [phone]

## 2014-05-03 NOTE — ED Notes (Signed)
Per EMS pt states around noon today pt started  having lower abdominal pain pt had vomiting x1 today. Pt has lower back pain as well with hx of back surgery. Pt is alert,oriented and ambulatory comes from home.

## 2014-05-03 NOTE — ED Notes (Signed)
Pt able to ambulate in the hall and to the bathroom without assistance or assistive device; pt's gait and balance steady and denies dizziness or lightheadedness.

## 2014-05-03 NOTE — ED Provider Notes (Signed)
  Face-to-face evaluation   History: Onset abdominal pain with vomiting coming in today. No dysuria, fever, or diarrhea. She states she was constipated yesterday, and had to strain to have a bowel movement, but today had a normal bowel movement. No similar problems in the past  Physical exam: Alert, elderly, female, who appears comfortable, when examined at 23:10. Heart regular rate and rhythm. No murmur. Lungs clear. Abdomen normal bowel sounds, soft, nontender to palpation  Medical screening examination/treatment/procedure(s) were conducted as a shared visit with non-physician practitioner(s) and myself.  I personally evaluated the patient during the encounter  Flint MelterElliott L Raygen Dahm, MD 05/05/14 1415

## 2014-05-03 NOTE — ED Notes (Signed)
Bed: [id number] Expected date:  Expected time:  Means of arrival:  Comments: EMS 80 yo female from home with abdominal pain since noon/vomiting

## 2014-05-04 NOTE — Discharge Instructions (Signed)
Please read and follow all provided instructions.  Your diagnoses today include:  1. Non-intractable vomiting with nausea, vomiting of unspecified type   2. Abdominal pain, suprapubic, unspecified laterality     Tests performed today include:  Blood counts and electrolytes  Blood tests to check liver and kidney function  Blood tests to check pancreas function  Urine test to look for infection and pregnancy (in women)  CT abdomen and pelvis - no findings to explain symptoms  Vital signs. See below for your results today.   Medications prescribed:   None  Take any prescribed medications only as directed.  Home care instructions:   Follow any educational materials contained in this packet.  Follow-up instructions: Please follow-up with your primary care provider in the next 2 days for further evaluation of your symptoms.    Return instructions:  SEEK IMMEDIATE MEDICAL ATTENTION IF:  The pain does not go away or becomes severe   A temperature above 101F develops   Repeated vomiting occurs (multiple episodes)   The pain becomes localized to portions of the abdomen. The right side could possibly be appendicitis. In an adult, the left lower portion of the abdomen could be colitis or diverticulitis.   Blood is being passed in stools or vomit (bright red or black tarry stools)   You develop chest pain, difficulty breathing, dizziness or fainting, or become confused, poorly responsive, or inconsolable (young children)  If you have any other emergent concerns regarding your health  Additional Information: Abdominal (belly) pain can be caused by many things. Your caregiver performed an examination and possibly ordered blood/urine tests and imaging (CT scan, x-rays, ultrasound). Many cases can be observed and treated at home after initial evaluation in the emergency department. Even though you are being discharged home, abdominal pain can be unpredictable. Therefore, you need a  repeated exam if your pain does not resolve, returns, or worsens. Most patients with abdominal pain don't have to be admitted to the hospital or have surgery, but serious problems like appendicitis and gallbladder attacks can start out as nonspecific pain. Many abdominal conditions cannot be diagnosed in one visit, so follow-up evaluations are very important.  Your vital signs today were: BP 162/80   Pulse 93   Temp(Src) 98.4 F (36.9 C) (Oral)   Resp 14   SpO2 97%   LMP 10/22/1959 If your blood pressure (bp) was elevated above 135/85 this visit, please have this repeated by your doctor within one month. --------------

## 2014-05-05 LAB — URINE CULTURE: Colony Count: 4000

## 2014-12-28 ENCOUNTER — Ambulatory Visit
Admission: RE | Admit: 2014-12-28 | Discharge: 2014-12-28 | Disposition: A | Discharge status: Home / Self Care | Payer: Medicare Other | Source: Ambulatory Visit | Attending: Internal Medicine | Admitting: Internal Medicine

## 2014-12-28 ENCOUNTER — Other Ambulatory Visit: Payer: Self-pay | Admitting: Internal Medicine

## 2014-12-28 DIAGNOSIS — R059 Cough, unspecified: Secondary | ICD-10-CM

## 2014-12-28 DIAGNOSIS — R05 Cough: Secondary | ICD-10-CM

## 2016-06-03 ENCOUNTER — Other Ambulatory Visit (HOSPITAL_COMMUNITY): Payer: Self-pay | Admitting: Internal Medicine

## 2016-06-03 ENCOUNTER — Ambulatory Visit (HOSPITAL_COMMUNITY)
Admission: RE | Admit: 2016-06-03 | Discharge: 2016-06-03 | Disposition: A | Discharge status: Home / Self Care | Payer: Medicare Other | Source: Ambulatory Visit | Attending: Internal Medicine | Admitting: Internal Medicine

## 2016-06-03 DIAGNOSIS — M79605 Pain in left leg: Secondary | ICD-10-CM | POA: Diagnosis not present

## 2016-06-03 DIAGNOSIS — M7989 Other specified soft tissue disorders: Principal | ICD-10-CM

## 2016-06-03 NOTE — Progress Notes (Signed)
VASCULAR LAB PRELIMINARY  PRELIMINARY  PRELIMINARY  PRELIMINARY  Bilateral lower extremity venous duplex has been completed.    Left leg evidence of DVT in the femoral prox., mid and distally , popliteal vein and posterior tibial veins  Right leg: No evidence of DVTor superficial thrombosis.  Bilaterally-no Baker's cyst  Called Dr. Su Hiltoberts' soffice spoke with BJ, RN.  Jenetta Logesami Maison Kestenbaum, RVT, RDMS 06/03/2016, 4:23 PM

## 2017-10-30 ENCOUNTER — Ambulatory Visit
Admission: RE | Admit: 2017-10-30 | Discharge: 2017-10-30 | Disposition: A | Discharge status: Home / Self Care | Payer: 59 | Source: Ambulatory Visit | Attending: Internal Medicine | Admitting: Internal Medicine

## 2017-10-30 ENCOUNTER — Other Ambulatory Visit: Payer: Self-pay | Admitting: Internal Medicine

## 2017-10-30 DIAGNOSIS — M25512 Pain in left shoulder: Secondary | ICD-10-CM

## 2017-11-18 ENCOUNTER — Ambulatory Visit
Admission: RE | Admit: 2017-11-18 | Discharge: 2017-11-18 | Disposition: A | Discharge status: Home / Self Care | Payer: 59 | Source: Ambulatory Visit | Attending: Internal Medicine | Admitting: Internal Medicine

## 2017-11-18 ENCOUNTER — Other Ambulatory Visit: Payer: Self-pay | Admitting: Internal Medicine

## 2017-11-18 DIAGNOSIS — R0781 Pleurodynia: Secondary | ICD-10-CM

## 2018-03-07 ENCOUNTER — Emergency Department (HOSPITAL_COMMUNITY): Payer: Medicare HMO

## 2018-03-07 ENCOUNTER — Emergency Department (HOSPITAL_COMMUNITY)
Admission: EM | Admit: 2018-03-07 | Discharge: 2018-03-08 | Disposition: A | Discharge status: Home / Self Care | Payer: Medicare HMO | Attending: Emergency Medicine | Admitting: Emergency Medicine

## 2018-03-07 ENCOUNTER — Other Ambulatory Visit: Payer: Self-pay

## 2018-03-07 DIAGNOSIS — Y939 Activity, unspecified: Secondary | ICD-10-CM | POA: Diagnosis not present

## 2018-03-07 DIAGNOSIS — Y92511 Restaurant or cafe as the place of occurrence of the external cause: Secondary | ICD-10-CM | POA: Insufficient documentation

## 2018-03-07 DIAGNOSIS — Y999 Unspecified external cause status: Secondary | ICD-10-CM | POA: Insufficient documentation

## 2018-03-07 DIAGNOSIS — Z79899 Other long term (current) drug therapy: Secondary | ICD-10-CM | POA: Insufficient documentation

## 2018-03-07 DIAGNOSIS — W010XXA Fall on same level from slipping, tripping and stumbling without subsequent striking against object, initial encounter: Secondary | ICD-10-CM | POA: Diagnosis not present

## 2018-03-07 DIAGNOSIS — I10 Essential (primary) hypertension: Secondary | ICD-10-CM | POA: Diagnosis not present

## 2018-03-07 DIAGNOSIS — S0181XA Laceration without foreign body of other part of head, initial encounter: Secondary | ICD-10-CM | POA: Insufficient documentation

## 2018-03-07 DIAGNOSIS — S0990XA Unspecified injury of head, initial encounter: Secondary | ICD-10-CM | POA: Diagnosis present

## 2018-03-07 DIAGNOSIS — W19XXXA Unspecified fall, initial encounter: Secondary | ICD-10-CM

## 2018-03-07 MED ORDER — LIDOCAINE-EPINEPHRINE (PF) 2 %-1:200000 IJ SOLN
10.0000 mL | Freq: Once | INTRAMUSCULAR | Status: AC
  Administered 2018-03-07: 10 mL
  Filled 2018-03-07: qty 20

## 2018-03-07 NOTE — ED Provider Notes (Signed)
[facility] EMERGENCY DEPARTMENT Provider Note   CSN: [id number] Arrival date & time: 03/07/18  1939     History   Chief Complaint Chief Complaint  Patient presents with  . Fall    HPI Wendy Andrews is a 84 y.o. female with a past medical history of hypertension, hyperlipidemia, has not taken any blood thinning medicines for the past month, who presents today for evaluation after a reported mechanical trip and fall.  She reports that she was at [facility] and had finished eating dinner when she tripped over her flip-flops while exiting the restaurant and fell striking the right side of her head on the concrete.  She denies any syncope.  She reports that her head does not hurt.  Denies any prodromal symptoms.  She reports that she is 100% sure that her fall was from tripping over her flip-flops.  Denies any new numbness or tingling, no pain in head, neck, hips, she reports she has been ambulatory since.  HPI  Past Medical History:  Diagnosis Date  . Hyperlipidemia   . Hypertension   . Insomnia   . Interstitial cystitis     Patient Active Problem List   Diagnosis Date Noted  . S/P laparoscopic cholecystectomy Feb 2015 12/12/2013    Past Surgical History:  Procedure Laterality Date  . ABDOMINAL HYSTERECTOMY    . CHOLECYSTECTOMY N/A 12/10/2013   Procedure: LAPAROSCOPIC CHOLECYSTECTOMY WITH INTRAOPERATIVE CHOLANGIOGRAM;  Surgeon: Kandis Cocking, MD;  Location: [facility];  Service: General;  Laterality: N/A;     OB History   None      Home Medications    Prior to Admission medications   Medication Sig Start Date End Date Taking? Authorizing Provider  amLODipine (NORVASC) 10 MG tablet Take 10 mg by mouth daily.    [provider]  estrogens, conjugated, (PREMARIN) 0.625 MG tablet Take 1.25 mg by mouth daily.    [provider]  levothyroxine (SYNTHROID, LEVOTHROID) 75 MCG tablet Take 75 mcg by mouth daily.    [provider]  lisinopril (PRINIVIL,ZESTRIL) 10 MG tablet Take 10 mg by mouth daily.    [provider]  traZODone (DESYREL) 50 MG tablet Take 50 mg by mouth at bedtime.    [provider]    Family History Family History  Problem Relation Age of Onset  . Heart failure Mother   . Heart failure Father   . Cancer Sister   . Cancer Brother        lung  . Cancer Brother        brain    Social History Social History   Tobacco Use  . Smoking status: Never Smoker  Substance Use Topics  . Alcohol use: No  . Drug use: No     Allergies   Tetanus toxoids and Penicillins   Review of Systems Review of Systems  Constitutional: Negative for chills and fever.  HENT: Negative for dental problem and facial swelling.   Eyes: Negative for visual disturbance.  Respiratory: Negative for chest tightness and shortness of breath.   Cardiovascular: Negative for chest pain and palpitations.  Gastrointestinal: Negative for abdominal pain, diarrhea and nausea.  Genitourinary: Negative for difficulty urinating and dysuria.  Musculoskeletal: Negative for arthralgias, back pain, neck pain and neck stiffness.  Skin: Positive for wound.  Neurological: Negative for dizziness, syncope, speech difficulty, weakness, light-headedness and headaches.  All other systems reviewed and are negative.    Physical Exam Updated Vital Signs BP (  S) (!) 190/84 (BP Location: Left Arm) Comment: Md aware; patient needs to take her home BP meds when she arrives home  Pulse 82   Temp 98.4 F (36.9 C) (Oral)   Resp (!) 22   Ht  (1.626 m)   Wt 88.9 kg (196 lb)   LMP 10/22/1959   SpO2 97%   BMI 33.64 kg/m   Physical Exam  Constitutional: She is oriented to person, place, and time. She appears well-developed and well-nourished. No distress.  HENT:  Head: Normocephalic.  Mouth/Throat: Oropharynx is clear and moist.  3 cm laceration to the right anterior lateral forehead.  Eyes: Pupils are equal,  round, and reactive to light. Conjunctivae and EOM are normal. Right eye exhibits no discharge. Left eye exhibits no discharge. No scleral icterus.  Neck: Normal range of motion. Neck supple.  Cardiovascular: Normal rate, regular rhythm and intact distal pulses.  2+ DP/PT pulses bilaterally.  Pulmonary/Chest: Effort normal and breath sounds normal. No stridor. No respiratory distress.  Abdominal: Soft. She exhibits no distension. There is no tenderness. There is no guarding.  Musculoskeletal: She exhibits no edema or deformity.  Neurological: She is alert and oriented to person, place, and time. She exhibits normal muscle tone.  Patient is awake and alert, interacts appropriately.  She has no facial droop.  5/5 strength in bilateral upper and lower extremities.  No slurred speech.    Skin: Skin is warm and dry. She is not diaphoretic.  Psychiatric: She has a normal mood and affect. Her behavior is normal.  Nursing note and vitals reviewed.    ED Treatments / Results  Labs (all labs ordered are listed, but only abnormal results are displayed) Labs Reviewed - No data to display  EKG None  Radiology Ct Head Wo Contrast  Result Date: 03/07/2018 CLINICAL DATA:  Status post fall, with lacerations to forehead, and neck and head pain. Initial encounter. EXAM: CT HEAD WITHOUT CONTRAST CT CERVICAL SPINE WITHOUT CONTRAST TECHNIQUE: Multidetector CT imaging of the head and cervical spine was performed following the standard protocol without intravenous contrast. Multiplanar CT image reconstructions of the cervical spine were also generated. COMPARISON:  MRI of the brain performed 05/25/2010, and CT of the head performed 08/04/2010 FINDINGS: CT HEAD FINDINGS Brain: No evidence of acute infarction, hemorrhage, hydrocephalus, extra-axial collection or mass lesion/mass effect. Scattered periventricular subcortical white matter change likely reflects small vessel ischemic microangiopathy. The posterior  fossa, including the cerebellum, brainstem and fourth ventricle, is within normal limits. The third and lateral ventricles, and basal ganglia are unremarkable in appearance. The cerebral hemispheres are symmetric in appearance, with normal gray-white differentiation. No mass effect or midline shift is seen. Vascular: No hyperdense vessel or unexpected calcification. Skull: There is no evidence of fracture; visualized osseous structures are unremarkable in appearance. Sinuses/Orbits: The visualized portions of the orbits are within normal limits. There is mild partial opacification of the right maxillary sinus. The remaining paranasal sinuses and mastoid air cells are well-aerated. Other: No significant soft tissue abnormalities are seen. CT CERVICAL SPINE FINDINGS Alignment: Normal. Skull base and vertebrae: No acute fracture. No primary bone lesion or focal pathologic process. Soft tissues and spinal canal: No prevertebral fluid or swelling. No visible canal hematoma. Disc levels: Intervertebral disc spaces are preserved. Scattered anterior and posterior disc osteophyte complexes are noted along the lower cervical spine. Mild degenerative change is noted about the dens. Upper chest: Calcification is noted at the carotid bifurcations bilaterally. Minimal scarring is noted at the  lung apices. Other: No additional soft tissue abnormalities are seen. IMPRESSION: 1. No evidence of traumatic intracranial injury or fracture. 2. No evidence of fracture or subluxation along the cervical spine. 3. Scattered small vessel ischemic microangiopathy. 4. Mild partial opacification of the right maxillary sinus. 5. Mild degenerative change along the lower cervical spine. 6. Calcification at the carotid bifurcations bilaterally. Carotid ultrasound would be helpful for further evaluation, when and as deemed clinically appropriate. Electronically Signed   By: Roanna Raider M.D.   On: 03/07/2018 21:41   Ct Cervical Spine Wo  Contrast  Result Date: 03/07/2018 CLINICAL DATA:  Status post fall, with lacerations to forehead, and neck and head pain. Initial encounter. EXAM: CT HEAD WITHOUT CONTRAST CT CERVICAL SPINE WITHOUT CONTRAST TECHNIQUE: Multidetector CT imaging of the head and cervical spine was performed following the standard protocol without intravenous contrast. Multiplanar CT image reconstructions of the cervical spine were also generated. COMPARISON:  MRI of the brain performed 05/25/2010, and CT of the head performed 08/04/2010 FINDINGS: CT HEAD FINDINGS Brain: No evidence of acute infarction, hemorrhage, hydrocephalus, extra-axial collection or mass lesion/mass effect. Scattered periventricular subcortical white matter change likely reflects small vessel ischemic microangiopathy. The posterior fossa, including the cerebellum, brainstem and fourth ventricle, is within normal limits. The third and lateral ventricles, and basal ganglia are unremarkable in appearance. The cerebral hemispheres are symmetric in appearance, with normal gray-white differentiation. No mass effect or midline shift is seen. Vascular: No hyperdense vessel or unexpected calcification. Skull: There is no evidence of fracture; visualized osseous structures are unremarkable in appearance. Sinuses/Orbits: The visualized portions of the orbits are within normal limits. There is mild partial opacification of the right maxillary sinus. The remaining paranasal sinuses and mastoid air cells are well-aerated. Other: No significant soft tissue abnormalities are seen. CT CERVICAL SPINE FINDINGS Alignment: Normal. Skull base and vertebrae: No acute fracture. No primary bone lesion or focal pathologic process. Soft tissues and spinal canal: No prevertebral fluid or swelling. No visible canal hematoma. Disc levels: Intervertebral disc spaces are preserved. Scattered anterior and posterior disc osteophyte complexes are noted along the lower cervical spine. Mild  degenerative change is noted about the dens. Upper chest: Calcification is noted at the carotid bifurcations bilaterally. Minimal scarring is noted at the lung apices. Other: No additional soft tissue abnormalities are seen. IMPRESSION: 1. No evidence of traumatic intracranial injury or fracture. 2. No evidence of fracture or subluxation along the cervical spine. 3. Scattered small vessel ischemic microangiopathy. 4. Mild partial opacification of the right maxillary sinus. 5. Mild degenerative change along the lower cervical spine. 6. Calcification at the carotid bifurcations bilaterally. Carotid ultrasound would be helpful for further evaluation, when and as deemed clinically appropriate. Electronically Signed   By: Roanna Raider M.D.   On: 03/07/2018 21:41    Procedures .Marland KitchenLaceration Repair Date/Time: 03/08/2018 12:10 AM Performed by: Cristina Gong, PA-C Authorized by: Cristina Gong, PA-C   Consent:    Consent obtained:  Verbal   Consent given by:  Patient   Risks discussed:  Infection, need for additional repair, poor cosmetic result, pain, retained foreign body, tendon damage, vascular damage, poor wound healing and nerve damage   Alternatives discussed:  No treatment and referral (Alternative wound closures) Anesthesia (see MAR for exact dosages):    Anesthesia method:  Local infiltration   Local anesthetic:  Lidocaine 2% WITH epi Laceration details:    Location:  Scalp   Scalp location:  Frontal   Length (cm):  3 Repair type:    Repair type:  Simple Pre-procedure details:    Preparation:  Imaging obtained to evaluate for foreign bodies and patient was prepped and draped in usual sterile fashion Exploration:    Hemostasis achieved with:  Epinephrine   Wound exploration: wound explored through full range of motion and entire depth of wound probed and visualized   Treatment:    Area cleansed with:  Saline and Shur-Clens   Amount of cleaning:  Standard   Irrigation  solution:  Sterile saline   Irrigation method:  Syringe Skin repair:    Repair method:  Sutures   Suture size:  5-0   Suture technique:  Simple interrupted   Number of sutures:  4 Post-procedure details:    Dressing:  Open (no dressing)   Patient tolerance of procedure:  Tolerated well, no immediate complications   (including critical care time)  Medications Ordered in ED Medications  lidocaine-EPINEPHrine (XYLOCAINE W/EPI) 2 %-1:200000 (PF) injection 10 mL (10 mLs Infiltration Given 03/07/18 2345)     Initial Impression / Assessment and Plan / ED Course  I have reviewed the triage vital signs and the nursing notes.  Pertinent labs & imaging results that were available during my care of the patient were reviewed by me and considered in my medical decision making (see chart for details).    Irrigation performed. Wound explored and base of wound visualized in a bloodless field without evidence of foreign body.  Laceration occurred < 8 hours prior to repair which was well tolerated.  Tdap was not updated secondary to allergy.   Pt has  no comorbidities to effect normal wound healing. Pt discharged  without antibiotics.  Discussed suture home care with patient and answered questions. Pt to follow-up for wound check and suture removal in 5-7 days; they are to return to the ED sooner for signs of infection.  Patient was observed for multiple hours in the emergency room without worsening condition.  CT head and neck were both obtained which did not show any acute abnormalities.  Pt is hemodynamically stable with no complaints prior to dc.   This patient was seen as a shared visit with Dr. Deretha Emory who agreed with my plan.    Final Clinical Impressions(s) / ED Diagnoses   Final diagnoses:  Fall, initial encounter  Facial laceration, initial encounter    ED Discharge Orders    None       Cristina Gong, PA-C 03/08/18 0013    Vanetta Mulders, MD 03/19/18 [phone]

## 2018-03-07 NOTE — ED Notes (Signed)
Wound cleaned.

## 2018-03-07 NOTE — ED Triage Notes (Signed)
Patient as at restaurant and tripped and fell on a rug. Denies LOC. Laceration/avulstion to right side of forehead. Swelling noted.

## 2018-03-08 NOTE — Discharge Instructions (Addendum)
Take your blood pressure meds before you go to bed.    Please keep an eye on your wound.  You may use ice 20 minutes on, 20 minutes off, making sure that you have a towel or other barrier between your skin and the ice.  If you develop redness, fevers, chills, or pus draining from the wound then please seek additional medical care.   Please take Ibuprofen (Advil, motrin) and Tylenol (acetaminophen) to relieve your pain.  You may take up to 600 MG (3 pills) of normal strength ibuprofen every 8 hours as needed.  In between doses of ibuprofen you make take tylenol, up to 1,000 mg (two extra strength pills).  Do not take more than 3,000 mg tylenol in a 24 hour period.  Please check all medication labels as many medications such as pain and cold medications may contain tylenol.  Do not drink alcohol while taking these medications.  Do not take other NSAID'S while taking ibuprofen (such as aleve or naproxen).  Please take ibuprofen with food to decrease stomach upset.

## 2018-09-05 ENCOUNTER — Emergency Department (HOSPITAL_COMMUNITY): Payer: Medicare HMO

## 2018-09-05 ENCOUNTER — Inpatient Hospital Stay (HOSPITAL_COMMUNITY)
Admission: EM | Admit: 2018-09-05 | Discharge: 2018-09-15 | DRG: 041 | Disposition: A | Discharge status: Skilled Nursing Facility | Payer: Medicare HMO | Attending: Family Medicine | Admitting: Family Medicine

## 2018-09-05 ENCOUNTER — Encounter (HOSPITAL_COMMUNITY): Payer: Self-pay | Admitting: Emergency Medicine

## 2018-09-05 ENCOUNTER — Other Ambulatory Visit: Payer: Self-pay

## 2018-09-05 ENCOUNTER — Inpatient Hospital Stay (HOSPITAL_COMMUNITY): Payer: Medicare HMO

## 2018-09-05 DIAGNOSIS — E876 Hypokalemia: Secondary | ICD-10-CM

## 2018-09-05 DIAGNOSIS — I6389 Other cerebral infarction: Secondary | ICD-10-CM | POA: Diagnosis not present

## 2018-09-05 DIAGNOSIS — I10 Essential (primary) hypertension: Secondary | ICD-10-CM | POA: Diagnosis not present

## 2018-09-05 DIAGNOSIS — Z23 Encounter for immunization: Secondary | ICD-10-CM

## 2018-09-05 DIAGNOSIS — N3010 Interstitial cystitis (chronic) without hematuria: Secondary | ICD-10-CM | POA: Diagnosis present

## 2018-09-05 DIAGNOSIS — I129 Hypertensive chronic kidney disease with stage 1 through stage 4 chronic kidney disease, or unspecified chronic kidney disease: Secondary | ICD-10-CM | POA: Diagnosis present

## 2018-09-05 DIAGNOSIS — Z880 Allergy status to penicillin: Secondary | ICD-10-CM

## 2018-09-05 DIAGNOSIS — Z887 Allergy status to serum and vaccine status: Secondary | ICD-10-CM

## 2018-09-05 DIAGNOSIS — Z6832 Body mass index (BMI) 32.0-32.9, adult: Secondary | ICD-10-CM | POA: Diagnosis not present

## 2018-09-05 DIAGNOSIS — I639 Cerebral infarction, unspecified: Secondary | ICD-10-CM

## 2018-09-05 DIAGNOSIS — E039 Hypothyroidism, unspecified: Secondary | ICD-10-CM | POA: Diagnosis present

## 2018-09-05 DIAGNOSIS — E669 Obesity, unspecified: Secondary | ICD-10-CM | POA: Diagnosis present

## 2018-09-05 DIAGNOSIS — Z79899 Other long term (current) drug therapy: Secondary | ICD-10-CM

## 2018-09-05 DIAGNOSIS — R414 Neurologic neglect syndrome: Secondary | ICD-10-CM | POA: Diagnosis present

## 2018-09-05 DIAGNOSIS — G8194 Hemiplegia, unspecified affecting left nondominant side: Secondary | ICD-10-CM | POA: Diagnosis present

## 2018-09-05 DIAGNOSIS — E785 Hyperlipidemia, unspecified: Secondary | ICD-10-CM | POA: Diagnosis present

## 2018-09-05 DIAGNOSIS — I700 Atherosclerosis of aorta: Secondary | ICD-10-CM | POA: Diagnosis present

## 2018-09-05 DIAGNOSIS — D72829 Elevated white blood cell count, unspecified: Secondary | ICD-10-CM | POA: Diagnosis not present

## 2018-09-05 DIAGNOSIS — N179 Acute kidney failure, unspecified: Secondary | ICD-10-CM | POA: Diagnosis present

## 2018-09-05 DIAGNOSIS — I63411 Cerebral infarction due to embolism of right middle cerebral artery: Secondary | ICD-10-CM | POA: Diagnosis present

## 2018-09-05 DIAGNOSIS — N183 Chronic kidney disease, stage 3 (moderate): Secondary | ICD-10-CM | POA: Diagnosis present

## 2018-09-05 DIAGNOSIS — I361 Nonrheumatic tricuspid (valve) insufficiency: Secondary | ICD-10-CM | POA: Diagnosis not present

## 2018-09-05 DIAGNOSIS — I63511 Cerebral infarction due to unspecified occlusion or stenosis of right middle cerebral artery: Secondary | ICD-10-CM | POA: Diagnosis not present

## 2018-09-05 DIAGNOSIS — R29710 NIHSS score 10: Secondary | ICD-10-CM | POA: Diagnosis present

## 2018-09-05 DIAGNOSIS — I370 Nonrheumatic pulmonary valve stenosis: Secondary | ICD-10-CM | POA: Diagnosis not present

## 2018-09-05 DIAGNOSIS — I340 Nonrheumatic mitral (valve) insufficiency: Secondary | ICD-10-CM | POA: Diagnosis not present

## 2018-09-05 DIAGNOSIS — I672 Cerebral atherosclerosis: Secondary | ICD-10-CM | POA: Diagnosis present

## 2018-09-05 HISTORY — DX: Cerebral infarction, unspecified: I63.9

## 2018-09-05 LAB — RAPID URINE DRUG SCREEN, HOSP PERFORMED
Amphetamines: NOT DETECTED
Barbiturates: NOT DETECTED
Benzodiazepines: NOT DETECTED
Cocaine: NOT DETECTED
Opiates: NOT DETECTED
Tetrahydrocannabinol: NOT DETECTED

## 2018-09-05 LAB — CBC
HCT: 46.3 % — ABNORMAL HIGH (ref 36.0–46.0)
Hemoglobin: 14.6 g/dL (ref 12.0–15.0)
MCH: 25.3 pg — ABNORMAL LOW (ref 26.0–34.0)
MCHC: 31.5 g/dL (ref 30.0–36.0)
MCV: 80.2 fL (ref 80.0–100.0)
Platelets: 274 10*3/uL (ref 150–400)
RBC: 5.77 MIL/uL — ABNORMAL HIGH (ref 3.87–5.11)
RDW: 16.2 % — ABNORMAL HIGH (ref 11.5–15.5)
WBC: 13.2 10*3/uL — ABNORMAL HIGH (ref 4.0–10.5)
nRBC: 0 % (ref 0.0–0.2)

## 2018-09-05 LAB — DIFFERENTIAL
Abs Immature Granulocytes: 0.05 10*3/uL (ref 0.00–0.07)
Basophils Absolute: 0.1 10*3/uL (ref 0.0–0.1)
Basophils Relative: 1 %
Eosinophils Absolute: 0.1 10*3/uL (ref 0.0–0.5)
Eosinophils Relative: 1 %
Immature Granulocytes: 0 %
Lymphocytes Relative: 12 %
Lymphs Abs: 1.6 10*3/uL (ref 0.7–4.0)
Monocytes Absolute: 1.1 10*3/uL — ABNORMAL HIGH (ref 0.1–1.0)
Monocytes Relative: 8 %
Neutro Abs: 10.4 10*3/uL — ABNORMAL HIGH (ref 1.7–7.7)
Neutrophils Relative %: 78 %

## 2018-09-05 LAB — URINALYSIS, ROUTINE W REFLEX MICROSCOPIC
Bilirubin Urine: NEGATIVE
Glucose, UA: NEGATIVE mg/dL
Ketones, ur: NEGATIVE mg/dL
Nitrite: NEGATIVE
Protein, ur: NEGATIVE mg/dL
Specific Gravity, Urine: >1.046 — ABNORMAL HIGH (ref 1.005–1.030)
pH: 5 (ref 5.0–8.0)

## 2018-09-05 LAB — ETHANOL: Alcohol, Ethyl (B): <10 mg/dL (ref <10)

## 2018-09-05 LAB — COMPREHENSIVE METABOLIC PANEL
ALT: 19 U/L (ref 0–44)
AST: 32 U/L (ref 15–41)
Albumin: 4 g/dL (ref 3.5–5.0)
Alkaline Phosphatase: 84 U/L (ref 38–126)
Anion gap: 12 (ref 5–15)
BUN: 23 mg/dL (ref 8–23)
CO2: 19 mmol/L — ABNORMAL LOW (ref 22–32)
Calcium: 9.1 mg/dL (ref 8.9–10.3)
Chloride: 103 mmol/L (ref 98–111)
Creatinine, Ser: 1.51 mg/dL — ABNORMAL HIGH (ref 0.44–1.00)
Glucose, Bld: 129 mg/dL — ABNORMAL HIGH (ref 70–99)
Potassium: 3.1 mmol/L — ABNORMAL LOW (ref 3.5–5.1)
Sodium: 134 mmol/L — ABNORMAL LOW (ref 135–145)
Total Bilirubin: 1.1 mg/dL (ref 0.3–1.2)
Total Protein: 7 g/dL (ref 6.5–8.1)

## 2018-09-05 LAB — APTT: aPTT: 29 seconds (ref 24–36)

## 2018-09-05 LAB — PROTIME-INR
INR: 1.06
Prothrombin Time: 13.7 seconds (ref 11.4–15.2)

## 2018-09-05 MED ORDER — ACETAMINOPHEN 160 MG/5ML PO SOLN: 650.0000 mg | ORAL | Status: DC | PRN

## 2018-09-05 MED ORDER — ENOXAPARIN SODIUM 30 MG/0.3ML ~~LOC~~ SOLN
30.0000 mg | SUBCUTANEOUS | Status: DC
  Administered 2018-09-06 – 2018-09-09 (×4): 30 mg via SUBCUTANEOUS
  Filled 2018-09-05 (×4): qty 0.3

## 2018-09-05 MED ORDER — IOPAMIDOL (ISOVUE-370) INJECTION 76%
INTRAVENOUS | Status: AC
  Filled 2018-09-05: qty 50

## 2018-09-05 MED ORDER — ACETAMINOPHEN 325 MG PO TABS: 650.0000 mg | ORAL_TABLET | ORAL | Status: DC | PRN

## 2018-09-05 MED ORDER — IOPAMIDOL (ISOVUE-370) INJECTION 76%
INTRAVENOUS | Status: AC
  Filled 2018-09-05: qty 100

## 2018-09-05 MED ORDER — ASPIRIN 300 MG RE SUPP: 300.0000 mg | Freq: Every day | RECTAL | Status: DC

## 2018-09-05 MED ORDER — ACETAMINOPHEN 650 MG RE SUPP: 650.0000 mg | RECTAL | Status: DC | PRN

## 2018-09-05 MED ORDER — ASPIRIN 325 MG PO TABS
325.0000 mg | ORAL_TABLET | Freq: Every day | ORAL | Status: DC
  Administered 2018-09-06 – 2018-09-15 (×10): 325 mg via ORAL
  Filled 2018-09-05 (×10): qty 1

## 2018-09-05 MED ORDER — IOPAMIDOL (ISOVUE-370) INJECTION 76%
125.0000 mL | Freq: Once | INTRAVENOUS | Status: AC | PRN
  Administered 2018-09-05: 125 mL via INTRAVENOUS

## 2018-09-05 MED ORDER — STROKE: EARLY STAGES OF RECOVERY BOOK
Freq: Once | Status: DC
  Filled 2018-09-05 (×2): qty 1

## 2018-09-05 NOTE — ED Notes (Signed)
Pt placed on PW 

## 2018-09-05 NOTE — ED Provider Notes (Signed)
[facility] EMERGENCY DEPARTMENT Provider Note   CSN: [id number] Arrival date & time: 09/05/18  2036   An emergency department physician performed an initial assessment on this suspected stroke patient at 2035.  History   Chief Complaint Chief Complaint  Patient presents with  . Code Stroke    HPI Wendy Andrews is a 84 y.o. female.  HPI Patient is an 84 year old female with past medical history of hypertension, hyperlipidemia, and insomnia presents to the emergency department for evaluation of strokelike symptoms.  Patient states that she has been having progressively worsening left-sided weakness since yesterday.  As result of her left-sided weakness she has had difficulty with ambulation in the home.  She is unsure exactly what time yesterday her symptoms started, however she has been falling since the onset.  Earlier this afternoon patient's family had not heard from her and as a result called EMS out for a wellness check.  EMS reports that they found the patient on the floor at her home trying to get to the door.  Secondary to patient's left-sided weakness and reported left-sided hemineglect they activated a code stroke prior to arrival to the emergency department.  At time of arrival to the emergency department patient does have mild left-sided hemineglect but is protecting her airway.  As a result patient was immediately taken to the CT scanner after being evaluated by neurology.  Past Medical History:  Diagnosis Date  . Hyperlipidemia   . Hypertension   . Insomnia   . Interstitial cystitis     Patient Active Problem List   Diagnosis Date Noted  . Acute ischemic right MCA stroke (HCC) 09/05/2018  . HTN (hypertension) 09/05/2018  . Hypothyroidism 09/05/2018  . HLD (hyperlipidemia) 09/05/2018  . S/P laparoscopic cholecystectomy Feb 2015 12/12/2013    Past Surgical History:  Procedure Laterality Date  . ABDOMINAL HYSTERECTOMY    . CHOLECYSTECTOMY  N/A 12/10/2013   Procedure: LAPAROSCOPIC CHOLECYSTECTOMY WITH INTRAOPERATIVE CHOLANGIOGRAM;  Surgeon: Kandis Cocking, MD;  Location: [facility] ORS;  Service: General;  Laterality: N/A;     OB History   None      Home Medications    Prior to Admission medications   Medication Sig Start Date End Date Taking? Authorizing Provider  amLODipine (NORVASC) 10 MG tablet Take 10 mg by mouth daily.    [provider]  estrogens, conjugated, (PREMARIN) 0.625 MG tablet Take 1.25 mg by mouth daily.    [provider]  levothyroxine (SYNTHROID, LEVOTHROID) 75 MCG tablet Take 75 mcg by mouth daily.    [provider]  lisinopril (PRINIVIL,ZESTRIL) 10 MG tablet Take 10 mg by mouth daily.    [provider]  traZODone (DESYREL) 50 MG tablet Take 50 mg by mouth at bedtime.    [provider]    Family History Family History  Problem Relation Age of Onset  . Heart failure Mother   . Heart failure Father   . Cancer Sister   . Cancer Brother        lung  . Cancer Brother        brain    Social History Social History   Tobacco Use  . Smoking status: Never Smoker  Substance Use Topics  . Alcohol use: No  . Drug use: No     Allergies   Tetanus toxoids and Penicillins   Review of Systems Review of Systems  Neurological: Positive for dizziness, weakness and light-headedness. Negative for speech difficulty and headaches.  Psychiatric/Behavioral:  Positive for confusion.  All other systems reviewed and are negative.    Physical Exam Updated Vital Signs BP (!) 152/80   Pulse 79   Temp 97.8 F (36.6 C) (Oral)   Resp (!) 23   Ht 5\' 4"  (1.626 m)   Wt 86.1 kg   LMP 10/22/1959   SpO2 95%   BMI 32.58 kg/m   Physical Exam  Constitutional: She appears well-developed and well-nourished.  HENT:  Head: Normocephalic and atraumatic.  Eyes: Pupils are equal, round, and reactive to light. Conjunctivae are normal.  Neck: Neck supple.  Cardiovascular:  Normal rate and regular rhythm.  Pulmonary/Chest: Effort normal. No respiratory distress.  Abdominal: Soft. There is no tenderness.  Musculoskeletal: She exhibits no edema.  Neurological: She is alert.  Patient is alert and oriented to person and place but not time.  On exam patient does have left-sided weakness in her upper and lower extremity.  She does appear to have some left-sided hemineglect, however will track past midline.  There was question of facial droop by EMS, however on my exam patient has no evidence of facial droop.  No evidence of slurred speech.  Reports mildly diminished sensation in her left upper and lower extremity.  Skin: Skin is warm and dry.  Nursing note and vitals reviewed.    ED Treatments / Results  Labs (all labs ordered are listed, but only abnormal results are displayed) Labs Reviewed  CBC - Abnormal; Notable for the following components:      Result Value   WBC 13.2 (*)    RBC 5.77 (*)    HCT 46.3 (*)    MCH 25.3 (*)    RDW 16.2 (*)    All other components within normal limits  DIFFERENTIAL - Abnormal; Notable for the following components:   Neutro Abs 10.4 (*)    Monocytes Absolute 1.1 (*)    All other components within normal limits  COMPREHENSIVE METABOLIC PANEL - Abnormal; Notable for the following components:   Sodium 134 (*)    Potassium 3.1 (*)    CO2 19 (*)    Glucose, Bld 129 (*)    Creatinine, Ser 1.51 (*)    All other components within normal limits  URINALYSIS, ROUTINE W REFLEX MICROSCOPIC - Abnormal; Notable for the following components:   APPearance HAZY (*)    Specific Gravity, Urine >1.046 (*)    Hgb urine dipstick SMALL (*)    Leukocytes, UA SMALL (*)    Bacteria, UA RARE (*)    All other components within normal limits  URINE CULTURE  ETHANOL  PROTIME-INR  APTT  RAPID URINE DRUG SCREEN, HOSP PERFORMED  HEMOGLOBIN A1C  LIPID PANEL  CK  I-STAT CHEM 8, ED  I-STAT TROPONIN, ED    EKG EKG  Interpretation  Date/Time:  Saturday September 05 2018 21:09:09 EST Ventricular Rate:  93 PR Interval:    QRS Duration: 86 QT Interval:  361 QTC Calculation: 449 R Axis:   13 Text Interpretation:  Sinus rhythm No significant change since last tracing Confirmed by Cathren Laine ([id number]) on 09/05/2018 9:44:20 PM   Radiology Ct Angio Head W Or Wo Contrast  Result Date: 09/05/2018 CLINICAL DATA:  84 year old female code stroke presentation with CT changes of acute right MCA territory infarct. EXAM: CT ANGIOGRAPHY HEAD AND NECK CT PERFUSION BRAIN TECHNIQUE: Multidetector CT imaging of the head and neck was performed using the standard protocol during bolus administration of intravenous contrast. Multiplanar CT image reconstructions and MIPs were  obtained to evaluate the vascular anatomy. Carotid stenosis measurements (when applicable) are obtained utilizing NASCET criteria, using the distal internal carotid diameter as the denominator. Multiphase CT imaging of the brain was performed following IV bolus contrast injection. Subsequent parametric perfusion maps were calculated using RAPID software. CONTRAST:  125mL ISOVUE-370 IOPAMIDOL (ISOVUE-370) INJECTION 76% COMPARISON:  Head CT without contrast 2044 hours. FINDINGS: CT Brain Perfusion Findings: ASPECTS 7 versus 8 on the plain head CT today. CBF (<30%) Volume: 0 (erroneous). Perfusion (Tmax>6.0s) volume: 19mL, much of which seems to correspond to the aspects changes on plain CT. Mismatch Volume: 19mL, but thought to be partially erroneous. Infarction Location:Posterior right MCA territory. CTA NECK Skeleton: Absent maxillary dentition and degenerative changes in the spine. No acute osseous abnormality identified. Upper chest: Gas trapping and/or atelectasis scattered in the upper lungs. No superior mediastinal lymphadenopathy. Other neck: Incidental venous contrast reflux into prominent lower internal jugular veins. Otherwise negative. Aortic arch: 3  vessel arch configuration. Moderate arch atherosclerosis although primarily distal to the great vessel origins. But furthermore, some of that plaque is ulcerated as seen on series 7, image 157. Right carotid system: Atherosclerosis without stenosis. Left carotid system: Soft plaque in the ventral left CCA without stenosis. Soft and calcified plaque at the left ICA origin and bulb resulting in less than 50 % stenosis with respect to the distal vessel. Vertebral arteries: Right subclavian artery origin plaque resulting in 50% stenosis. Normal right vertebral artery origin. Dominant right vertebral artery with intermittent atherosclerosis but no hemodynamically significant stenosis to the skull base. No proximal left subclavian artery stenosis despite plaque. Non dominant left vertebral artery. Mild to moderate V1 segment stenosis. The left vertebral remains small but patent to the skull base. CTA HEAD Posterior circulation: The left vertebral functionally terminates in PICA. Moderate right V4 segment stenosis occurs due to calcified plaque near the right PICA origin which remains patent. Patent vertebrobasilar junction. Moderately irregular and diminutive basilar artery with moderate stenosis just proximal to the SCA origins. Fetal type left PCA origin with severe left posterior communicating artery stenosis (series 10, image 36). Widespread mild irregularity and stenosis of the right PCA branches. Anterior circulation: Both ICA siphons are patent. Abundant calcified plaque bilaterally. Mild bilateral supraclinoid siphon stenosis results. Normal ophthalmic and left posterior communicating artery origins. Patent carotid termini. Dominant right ACA. The diminutive left A1 segment is stenotic. The right ACA remains dominant throughout. There is moderate stenosis of the right pericallosal artery (series 10, image 34). Left MCA M1 segment is patent without stenosis. Left MCA bifurcation is patent. No left MCA branch  occlusion identified. There is widespread distal left MCA branch irregularity. Right MCA M1 segment and bifurcation are patent. There is a right MCA posterior division M2 occlusion just beyond the bifurcation (series 9, image 124). Venous sinuses: Patent. Anatomic variants: Dominant right vertebral artery, the left functionally terminates in PICA. Fetal type left PCA origin. Review of the MIP images confirms the above findings IMPRESSION: 1. Positive for LVO: Right MCA posterior M2. 2. However, CTP does not detect the known posterior right MCA core infarct (ASPECTS 7 versus 8 today), and the 19 mL of CTP penumbra seems to largely correspond to the cytotoxic edema. 3. The above was discussed by telephone with Dr. Ritta SlotMCNEILL KIRKPATRICK on 09/05/2018 at 2102 hours. 4. Additionally, there is extensive intracranial atherosclerosis with significant stenoses: - dominant Right Vertebral Artery V4 segment (moderate). - Basilar Artery (moderate). - Left Pcomm, fetal left PCA origin (severe). - dominant Right  ACA pericallosal artery (moderate). 5. Aortic Atherosclerosis (ICD10-I70.0) with some ulcerated plaque in the distal arch. Electronically Signed   By: Odessa Fleming M.D.   On: 09/05/2018 21:41   Ct Angio Neck W Or Wo Contrast  Result Date: 09/05/2018 CLINICAL DATA:  84 year old female code stroke presentation with CT changes of acute right MCA territory infarct. EXAM: CT ANGIOGRAPHY HEAD AND NECK CT PERFUSION BRAIN TECHNIQUE: Multidetector CT imaging of the head and neck was performed using the standard protocol during bolus administration of intravenous contrast. Multiplanar CT image reconstructions and MIPs were obtained to evaluate the vascular anatomy. Carotid stenosis measurements (when applicable) are obtained utilizing NASCET criteria, using the distal internal carotid diameter as the denominator. Multiphase CT imaging of the brain was performed following IV bolus contrast injection. Subsequent parametric perfusion  maps were calculated using RAPID software. CONTRAST:  ISOVUE-370 IOPAMIDOL (ISOVUE-370) INJECTION 76% COMPARISON:  Head CT without contrast 2044 hours. FINDINGS: CT Brain Perfusion Findings: ASPECTS 7 versus 8 on the plain head CT today. CBF (<30%) Volume: 0 (erroneous). Perfusion (Tmax>6.0s) volume: 19mL, much of which seems to correspond to the aspects changes on plain CT. Mismatch Volume: 19mL, but thought to be partially erroneous. Infarction Location:Posterior right MCA territory. CTA NECK Skeleton: Absent maxillary dentition and degenerative changes in the spine. No acute osseous abnormality identified. Upper chest: Gas trapping and/or atelectasis scattered in the upper lungs. No superior mediastinal lymphadenopathy. Other neck: Incidental venous contrast reflux into prominent lower internal jugular veins. Otherwise negative. Aortic arch: 3 vessel arch configuration. Moderate arch atherosclerosis although primarily distal to the great vessel origins. But furthermore, some of that plaque is ulcerated as seen on series 7, image 157. Right carotid system: Atherosclerosis without stenosis. Left carotid system: Soft plaque in the ventral left CCA without stenosis. Soft and calcified plaque at the left ICA origin and bulb resulting in less than 50 % stenosis with respect to the distal vessel. Vertebral arteries: Right subclavian artery origin plaque resulting in 50% stenosis. Normal right vertebral artery origin. Dominant right vertebral artery with intermittent atherosclerosis but no hemodynamically significant stenosis to the skull base. No proximal left subclavian artery stenosis despite plaque. Non dominant left vertebral artery. Mild to moderate V1 segment stenosis. The left vertebral remains small but patent to the skull base. CTA HEAD Posterior circulation: The left vertebral functionally terminates in PICA. Moderate right V4 segment stenosis occurs due to calcified plaque near the right PICA origin  which remains patent. Patent vertebrobasilar junction. Moderately irregular and diminutive basilar artery with moderate stenosis just proximal to the SCA origins. Fetal type left PCA origin with severe left posterior communicating artery stenosis (series 10, image 36). Widespread mild irregularity and stenosis of the right PCA branches. Anterior circulation: Both ICA siphons are patent. Abundant calcified plaque bilaterally. Mild bilateral supraclinoid siphon stenosis results. Normal ophthalmic and left posterior communicating artery origins. Patent carotid termini. Dominant right ACA. The diminutive left A1 segment is stenotic. The right ACA remains dominant throughout. There is moderate stenosis of the right pericallosal artery (series 10, image 34). Left MCA M1 segment is patent without stenosis. Left MCA bifurcation is patent. No left MCA branch occlusion identified. There is widespread distal left MCA branch irregularity. Right MCA M1 segment and bifurcation are patent. There is a right MCA posterior division M2 occlusion just beyond the bifurcation (series 9, image 124). Venous sinuses: Patent. Anatomic variants: Dominant right vertebral artery, the left functionally terminates in PICA. Fetal type left PCA origin. Review of the  MIP images confirms the above findings IMPRESSION: 1. Positive for LVO: Right MCA posterior M2. 2. However, CTP does not detect the known posterior right MCA core infarct (ASPECTS 7 versus 8 today), and the 19 mL of CTP penumbra seems to largely correspond to the cytotoxic edema. 3. The above was discussed by telephone with Dr. Ritta Slot on 09/05/2018 at 2102 hours. 4. Additionally, there is extensive intracranial atherosclerosis with significant stenoses: - dominant Right Vertebral Artery V4 segment (moderate). - Basilar Artery (moderate). - Left Pcomm, fetal left PCA origin (severe). - dominant Right ACA pericallosal artery (moderate). 5. Aortic Atherosclerosis (ICD10-I70.0)  with some ulcerated plaque in the distal arch. Electronically Signed   By: Odessa Fleming M.D.   On: 09/05/2018 21:41   Ct Cerebral Perfusion W Contrast  Result Date: 09/05/2018 CLINICAL DATA:  84 year old female code stroke presentation with CT changes of acute right MCA territory infarct. EXAM: CT ANGIOGRAPHY HEAD AND NECK CT PERFUSION BRAIN TECHNIQUE: Multidetector CT imaging of the head and neck was performed using the standard protocol during bolus administration of intravenous contrast. Multiplanar CT image reconstructions and MIPs were obtained to evaluate the vascular anatomy. Carotid stenosis measurements (when applicable) are obtained utilizing NASCET criteria, using the distal internal carotid diameter as the denominator. Multiphase CT imaging of the brain was performed following IV bolus contrast injection. Subsequent parametric perfusion maps were calculated using RAPID software. CONTRAST:  ISOVUE-370 IOPAMIDOL (ISOVUE-370) INJECTION 76% COMPARISON:  Head CT without contrast 2044 hours. FINDINGS: CT Brain Perfusion Findings: ASPECTS 7 versus 8 on the plain head CT today. CBF (<30%) Volume: 0 (erroneous). Perfusion (Tmax>6.0s) volume: 19mL, much of which seems to correspond to the aspects changes on plain CT. Mismatch Volume: 19mL, but thought to be partially erroneous. Infarction Location:Posterior right MCA territory. CTA NECK Skeleton: Absent maxillary dentition and degenerative changes in the spine. No acute osseous abnormality identified. Upper chest: Gas trapping and/or atelectasis scattered in the upper lungs. No superior mediastinal lymphadenopathy. Other neck: Incidental venous contrast reflux into prominent lower internal jugular veins. Otherwise negative. Aortic arch: 3 vessel arch configuration. Moderate arch atherosclerosis although primarily distal to the great vessel origins. But furthermore, some of that plaque is ulcerated as seen on series 7, image 157. Right carotid system:  Atherosclerosis without stenosis. Left carotid system: Soft plaque in the ventral left CCA without stenosis. Soft and calcified plaque at the left ICA origin and bulb resulting in less than 50 % stenosis with respect to the distal vessel. Vertebral arteries: Right subclavian artery origin plaque resulting in 50% stenosis. Normal right vertebral artery origin. Dominant right vertebral artery with intermittent atherosclerosis but no hemodynamically significant stenosis to the skull base. No proximal left subclavian artery stenosis despite plaque. Non dominant left vertebral artery. Mild to moderate V1 segment stenosis. The left vertebral remains small but patent to the skull base. CTA HEAD Posterior circulation: The left vertebral functionally terminates in PICA. Moderate right V4 segment stenosis occurs due to calcified plaque near the right PICA origin which remains patent. Patent vertebrobasilar junction. Moderately irregular and diminutive basilar artery with moderate stenosis just proximal to the SCA origins. Fetal type left PCA origin with severe left posterior communicating artery stenosis (series 10, image 36). Widespread mild irregularity and stenosis of the right PCA branches. Anterior circulation: Both ICA siphons are patent. Abundant calcified plaque bilaterally. Mild bilateral supraclinoid siphon stenosis results. Normal ophthalmic and left posterior communicating artery origins. Patent carotid termini. Dominant right ACA. The diminutive left A1 segment is  stenotic. The right ACA remains dominant throughout. There is moderate stenosis of the right pericallosal artery (series 10, image 34). Left MCA M1 segment is patent without stenosis. Left MCA bifurcation is patent. No left MCA branch occlusion identified. There is widespread distal left MCA branch irregularity. Right MCA M1 segment and bifurcation are patent. There is a right MCA posterior division M2 occlusion just beyond the bifurcation (series 9,  image 124). Venous sinuses: Patent. Anatomic variants: Dominant right vertebral artery, the left functionally terminates in PICA. Fetal type left PCA origin. Review of the MIP images confirms the above findings IMPRESSION: 1. Positive for LVO: Right MCA posterior M2. 2. However, CTP does not detect the known posterior right MCA core infarct (ASPECTS 7 versus 8 today), and the 19 mL of CTP penumbra seems to largely correspond to the cytotoxic edema. 3. The above was discussed by telephone with Dr. Ritta Slot on 09/05/2018 at 2102 hours. 4. Additionally, there is extensive intracranial atherosclerosis with significant stenoses: - dominant Right Vertebral Artery V4 segment (moderate). - Basilar Artery (moderate). - Left Pcomm, fetal left PCA origin (severe). - dominant Right ACA pericallosal artery (moderate). 5. Aortic Atherosclerosis (ICD10-I70.0) with some ulcerated plaque in the distal arch. Electronically Signed   By: Odessa Fleming M.D.   On: 09/05/2018 21:41   Ct Head Code Stroke Wo Contrast  Result Date: 09/05/2018 CLINICAL DATA:  Code stroke. 84 year old female with left side weakness. EXAM: CT HEAD WITHOUT CONTRAST TECHNIQUE: Contiguous axial images were obtained from the base of the skull through the vertex without intravenous contrast. COMPARISON:  03/07/2018 head CT. FINDINGS: Brain: Cytotoxic edema in the posterior right MCA territory on series 8, image 36. Chronic but progressed bilateral basal ganglia and external capsule white matter hypodensity since May (series 8, image 16. Other confluent bilateral cerebral white matter hypodensity appears stable. No acute intracranial hemorrhage identified. No midline shift, mass effect, or evidence of intracranial mass lesion. No ventriculomegaly. Vascular: Calcified atherosclerosis at the skull base. No suspicious intracranial vascular hyperdensity. Skull: Negative. Sinuses/Orbits: Paranasal sinuses and mastoids are clear today. Other: No acute orbit or  scalp soft tissue finding. ASPECTS [facility] Stroke Program Early CT Score) - Ganglionic level infarction (caudate, lentiform nuclei, internal capsule, insula, M1-M3 cortex): 5 (abnormal right lentiform and M2 segment) - Supraganglionic infarction (M4-M6 cortex): 2 (abnormal right M5 segment). Total score (0-10 with 10 being normal): 7 IMPRESSION: 1. Evidence of acute posterior right MCA territory infarct. No hemorrhage or mass effect. 2. ASPECTS is 7. 3. Other bilateral progressed chronic small vessel disease since May 2019. 4. These results were communicated to Dr. Amada Jupiter at 8:58 pmon 11/16/2019by text page via the [facility] messaging system. Electronically Signed   By: Odessa Fleming M.D.   On: 09/05/2018 20:58    Procedures Procedures (including critical care time)  Medications Ordered in ED Medications  iopamidol (ISOVUE-370) 76 % injection (has no administration in time range)  iopamidol (ISOVUE-370) 76 % injection (has no administration in time range)   stroke: mapping our early stages of recovery book (has no administration in time range)  acetaminophen (TYLENOL) tablet 650 mg (has no administration in time range)    Or  acetaminophen (TYLENOL) solution 650 mg (has no administration in time range)    Or  acetaminophen (TYLENOL) suppository 650 mg (has no administration in time range)  enoxaparin (LOVENOX) injection 40 mg (has no administration in time range)  aspirin suppository 300 mg (has no administration in time range)    Or  aspirin tablet 325 mg (has no administration in time range)  iopamidol (ISOVUE-370) 76 % injection 125 mL (125 mLs Intravenous Contrast Given 09/05/18 2101)     Initial Impression / Assessment and Plan / ED Course  I have reviewed the triage vital signs and the nursing notes.  Pertinent labs & imaging results that were available during my care of the patient were reviewed by me and considered in my medical decision making (see chart for details).     Patient  is a 84 year old female past medical history as detailed above who presents to the emergency department for evaluation of strokelike symptoms.  Upon arrival to the emergency department patient did have left-sided hemineglect and left-sided weakness and as result was taken immediately to the CT scanner.  Patient was found to have a right MCA occlusion.  After being evaluated by neurology they do not believe that immediate intervention is warranted at this time given patient's unclear last known normal.  They recommend patient be admitted to the hospital service for MRI and echocardiogram.  I spoke with the hospitalist on-call regarding this patient's case and she was accepted to their service for further evaluation and care.  Patient with no further acute events while under my care.  The care of this patient was discussed with my attending physician Dr. Denton Lank, who voices agreement with work-up and ED disposition.  Final Clinical Impressions(s) / ED Diagnoses   Final diagnoses:  Cerebrovascular accident (CVA), unspecified mechanism [facility])    ED Discharge Orders    None       Caralynn Gelber, Winfield Rast, MD 09/05/18 Ouida Sills    Cathren Laine, MD 09/06/18 [phone]

## 2018-09-05 NOTE — Consult Note (Signed)
Neurology Consultation Reason for Consult: Left-sided weakness Referring Physician: Spinal, K  CC: Left-sided weakness  History is obtained from: Patient  HPI: Wendy LeavellLillian B Andrews is a 84 y.o. female with a history of hypertension, hyperlipidemia who presents with left-sided weakness that is been present at least since yesterday.  She does have some neglect, so the exact time of onset is unclear but she tells me that she was stumbling around yesterday which is unusual for her.  Today, family had not heard from her so they asked for a welfare check and she was found to have left-sided weakness and was brought in by EMS.  Due to positive LVO scale, a code stroke was activated on route.   LKW: Unclear tpa given?: no, outside of window    ROS: A 14 point ROS was performed and is negative except as noted in the HPI.   Past Medical History:  Diagnosis Date  . Hyperlipidemia   . Hypertension   . Insomnia   . Interstitial cystitis      Family History  Problem Relation Age of Onset  . Heart failure Mother   . Heart failure Father   . Cancer Sister   . Cancer Brother        lung  . Cancer Brother        brain     Social History:  reports that she has never smoked. She does not have any smokeless tobacco history on file. She reports that she does not drink alcohol or use drugs.   Exam: Current vital signs: LMP 10/22/1957   Vitals:   09/05/18 2111  BP: (!) 174/108  Pulse: 96  Resp: 18  Temp: 97.8 F (36.6 C)  SpO2: 99%    Vital signs in last 24 hours: BP: ()/()  Arterial Line BP: ()/()    Physical Exam  Constitutional: Appears well-developed and well-nourished.  Psych: Affect appropriate to situation Eyes: No scleral injection HENT: No OP obstrucion Head: Normocephalic.  Cardiovascular: Normal rate and regular rhythm.  Respiratory: Effort normal, non-labored breathing GI: Soft.  No distension. There is no tenderness.  Skin: WDI  Neuro: Mental  Status: Patient is awake, alert, oriented to person, place, she gives the month is April year, and situation. Patient is able to give a clear and coherent history. No signs of aphasia  She does have some neglect Cranial Nerves: II: She has an incomplete left hemianopia with some preservation of the upper field pupils are equal, round, and reactive to light.   III,IV, VI: She is able to cross midline readily, but does have a right gaze preference V: Facial sensation is symmetric to temperature VII: Facial movement is symmetric.  VIII: hearing is intact to voice X: Uvula elevates symmetrically XI: Shoulder shrug is symmetric. XII: tongue is midline without atrophy or fasciculations.  Motor: Tone is normal. Bulk is normal.  She has a mild left hemiparesis with 4/5 strength in the arm and leg, she also does not give good effort in the right leg and I suspect she may have some mild underlying weakness there as well, 4/5. Sensory: She reports symmetric sensation, though when actually testing if she feels anything, I think she does have some reduced sensation on the left side.  She does not clearly extinguish to double simultaneous stimulation, but she does have difficulty distinguishing which side is being touched. Cerebellar: She has significant intentional tremor without pass pointing bilaterally on finger-nose-finger   I have reviewed labs in epic and the  results pertinent to this consultation are: CMP-unremarkable  I have reviewed the images obtained: CT/CTA- infarct in the right posterior region, the CT perfusion to text all of this is penumbra, but the penumbra appears to be mostly corresponding to the area of completed infarct on CT.  Impression: 84 year old female with moderate right MCA distribution infarct.  Possibilities include embolic disease artery to artery, thrombotic disease from large vessel atherosclerosis, or cardioembolic.  With her significant intracranial atherosclerosis,  I think that large vessel athero-is a likely culprit.  She is being admitted for physical therapy as well as secondary risk factor modification.  Recommendations: - HgbA1c, fasting lipid panel - MRI of the brain without contrast - Frequent neuro checks - Echocardiogram - Prophylactic therapy-Antiplatelet med: Aspirin - dose 325mg  PO or 300mg  PR - Risk factor modification - Telemetry monitoring - PT consult, OT consult, Speech consult - Stroke team to follow   Ritta Slot, MD [facility] [phone]  If 7pm- 7am, please page neurology on call as listed in [facility]ION.

## 2018-09-05 NOTE — ED Triage Notes (Signed)
Pt to ED via [facility] ref. Code Stroke.  EMS reports they were called by family after a well check on pt found pt on floor trying to get to the door.  Pt st's she started having trouble walking yesterday with some left sided weakness.

## 2018-09-05 NOTE — H&P (Signed)
History and Physical    Wendy LeavellLillian B Waddington ZOX:[id number] DOB: 07/23/1930 DOA: 09/05/2018  PCP: Burton Apleyoberts, Ronald, MD  Patient coming from: Home  I have personally briefly [facility]  Chief Complaint: Code stroke  HPI: Wendy Andrews is a 84 y.o. female with medical history significant of HTN, HLD.  Patient has been having L sided weakness present since at least yesterday.  Exact time of onset unclear.  Patient was stumbling around yesterday she says.  Today family hadnt heard from patient so they asked for a wellfare check.  EMS found patient on floor trying to get to door.   ED Course: Patient noted to have L sided weakness and some neglect.  Came in as code stroke.  Found to have acute R MCA M2 occlusion on perfusion study.  Neuro consult in chart.   Review of Systems: As per HPI otherwise 10 point review of systems negative.   Past Medical History:  Diagnosis Date  . Hyperlipidemia   . Hypertension   . Insomnia   . Interstitial cystitis     Past Surgical History:  Procedure Laterality Date  . ABDOMINAL HYSTERECTOMY    . CHOLECYSTECTOMY N/A 12/10/2013   Procedure: LAPAROSCOPIC CHOLECYSTECTOMY WITH INTRAOPERATIVE CHOLANGIOGRAM;  Surgeon: Kandis Cockingavid H Newman, MD;  Location: [facility];  Service: General;  Laterality: N/A;     reports that she has never smoked. She does not have any smokeless tobacco history on file. She reports that she does not drink alcohol or use drugs.  Allergies  Allergen Reactions  . Tetanus Toxoids     unknown  . Penicillins Hives and Rash    Family History  Problem Relation Age of Onset  . Heart failure Mother   . Heart failure Father   . Cancer Sister   . Cancer Brother        lung  . Cancer Brother        brain     Prior to Admission medications   Medication Sig Start Date End Date Taking? Authorizing Provider  amLODipine (NORVASC) 10 MG tablet Take 10 mg by mouth daily.    [provider]  estrogens, conjugated, (PREMARIN) 0.625 MG tablet Take 1.25 mg by mouth daily.    [provider]  levothyroxine (SYNTHROID, LEVOTHROID) 75 MCG tablet Take 75 mcg by mouth daily.    [provider]  lisinopril (PRINIVIL,ZESTRIL) 10 MG tablet Take 10 mg by mouth daily.    [provider]  traZODone (DESYREL) 50 MG tablet Take 50 mg by mouth at bedtime.    [provider]    Physical Exam: Vitals:   09/05/18 2115 09/05/18 2116 09/05/18 2215 09/05/18 2230  BP: (!) 173/90  (!) 148/81 (!) 152/80  Pulse: 95  85 79  Resp: (!) 21  19 (!) 23  Temp:      TempSrc:      SpO2: 96%  96% 95%  Weight:  86.1 kg    Height:  5\' 4"  (1.626 m)      Constitutional: NAD, calm, comfortable Eyes: PERRL, lids and conjunctivae normal ENMT: Mucous membranes are moist. Posterior pharynx clear of any exudate or lesions.Normal dentition.  Neck: normal, supple, no masses, no thyromegaly Respiratory: clear to auscultation bilaterally, no wheezing, no crackles. Normal respiratory effort. No accessory muscle use.  Cardiovascular: Regular rate and rhythm, no murmurs / rubs / gallops. No extremity edema. 2+ pedal pulses. No carotid bruits.  Abdomen: no tenderness, no  masses palpated. No hepatosplenomegaly. Bowel sounds positive.  Musculoskeletal: no clubbing / cyanosis. No joint deformity upper and lower extremities. Good ROM, no contractures. Normal muscle tone.  Skin: no rashes, lesions, ulcers. No induration Neurologic: 4/5 strength on L.  Incomplete L heminaopia, R gaze preference but able to cross midline readily. Psychiatric: Normal judgment and insight. Oriented to person and place not time.   Labs on Admission: I have personally reviewed following labs and imaging studies  CBC: Recent Labs  Lab 09/05/18 2038  WBC 13.2*  NEUTROABS 10.4*  HGB 14.6  HCT 46.3*  MCV 80.2  PLT 274   Basic Metabolic Panel: Recent Labs  Lab 09/05/18 2038  NA 134*  K  3.1*  CL 103  CO2 19*  GLUCOSE 129*  BUN 23  CREATININE 1.51*  CALCIUM 9.1   GFR: Estimated Creatinine Clearance: 28.4 mL/min (A) (by C-G formula based on SCr of 1.51 mg/dL (H)). Liver Function Tests: Recent Labs  Lab 09/05/18 2038  AST 32  ALT 19  ALKPHOS 84  BILITOT 1.1  PROT 7.0  ALBUMIN 4.0   No results for input(s): LIPASE, AMYLASE in the last 168 hours. No results for input(s): AMMONIA in the last 168 hours. Coagulation Profile: Recent Labs  Lab 09/05/18 2038  INR 1.06   Cardiac Enzymes: No results for input(s): CKTOTAL, CKMB, CKMBINDEX, TROPONINI in the last 168 hours. BNP (last 3 results) No results for input(s): PROBNP in the last 8760 hours. HbA1C: No results for input(s): HGBA1C in the last 72 hours. CBG: No results for input(s): GLUCAP in the last 168 hours. Lipid Profile: No results for input(s): CHOL, HDL, LDLCALC, TRIG, CHOLHDL, LDLDIRECT in the last 72 hours. Thyroid Function Tests: No results for input(s): TSH, T4TOTAL, FREET4, T3FREE, THYROIDAB in the last 72 hours. Anemia Panel: No results for input(s): VITAMINB12, FOLATE, FERRITIN, TIBC, IRON, RETICCTPCT in the last 72 hours. Urine analysis:    Component Value Date/Time   COLORURINE YELLOW 09/05/2018 2229   APPEARANCEUR HAZY (A) 09/05/2018 2229   LABSPEC >1.046 (H) 09/05/2018 2229   PHURINE 5.0 09/05/2018 2229   GLUCOSEU NEGATIVE 09/05/2018 2229   HGBUR SMALL (A) 09/05/2018 2229   BILIRUBINUR NEGATIVE 09/05/2018 2229   KETONESUR NEGATIVE 09/05/2018 2229   PROTEINUR NEGATIVE 09/05/2018 2229   UROBILINOGEN 0.2 05/03/2014 2024   NITRITE NEGATIVE 09/05/2018 2229   LEUKOCYTESUR SMALL (A) 09/05/2018 2229    Radiological Exams on Admission: Ct Angio Head W Or Wo Contrast  Result Date: 09/05/2018 CLINICAL DATA:  84 year old female code stroke presentation with CT changes of acute right MCA territory infarct. EXAM: CT ANGIOGRAPHY HEAD AND NECK CT PERFUSION BRAIN TECHNIQUE: Multidetector CT  imaging of the head and neck was performed using the standard protocol during bolus administration of intravenous contrast. Multiplanar CT image reconstructions and MIPs were obtained to evaluate the vascular anatomy. Carotid stenosis measurements (when applicable) are obtained utilizing NASCET criteria, using the distal internal carotid diameter as the denominator. Multiphase CT imaging of the brain was performed following IV bolus contrast injection. Subsequent parametric perfusion maps were calculated using RAPID software. CONTRAST:  ISOVUE-370 IOPAMIDOL (ISOVUE-370) INJECTION 76% COMPARISON:  Head CT without contrast 2044 hours. FINDINGS: CT Brain Perfusion Findings: ASPECTS 7 versus 8 on the plain head CT today. CBF (<30%) Volume: 0 (erroneous). Perfusion (Tmax>6.0s) volume: 19mL, much of which seems to correspond to the aspects changes on plain CT. Mismatch Volume: 19mL, but thought to be partially erroneous. Infarction Location:Posterior right MCA territory. CTA NECK Skeleton: Absent  maxillary dentition and degenerative changes in the spine. No acute osseous abnormality identified. Upper chest: Gas trapping and/or atelectasis scattered in the upper lungs. No superior mediastinal lymphadenopathy. Other neck: Incidental venous contrast reflux into prominent lower internal jugular veins. Otherwise negative. Aortic arch: 3 vessel arch configuration. Moderate arch atherosclerosis although primarily distal to the great vessel origins. But furthermore, some of that plaque is ulcerated as seen on series 7, image 157. Right carotid system: Atherosclerosis without stenosis. Left carotid system: Soft plaque in the ventral left CCA without stenosis. Soft and calcified plaque at the left ICA origin and bulb resulting in less than 50 % stenosis with respect to the distal vessel. Vertebral arteries: Right subclavian artery origin plaque resulting in 50% stenosis. Normal right vertebral artery origin. Dominant right  vertebral artery with intermittent atherosclerosis but no hemodynamically significant stenosis to the skull base. No proximal left subclavian artery stenosis despite plaque. Non dominant left vertebral artery. Mild to moderate V1 segment stenosis. The left vertebral remains small but patent to the skull base. CTA HEAD Posterior circulation: The left vertebral functionally terminates in PICA. Moderate right V4 segment stenosis occurs due to calcified plaque near the right PICA origin which remains patent. Patent vertebrobasilar junction. Moderately irregular and diminutive basilar artery with moderate stenosis just proximal to the SCA origins. Fetal type left PCA origin with severe left posterior communicating artery stenosis (series 10, image 36). Widespread mild irregularity and stenosis of the right PCA branches. Anterior circulation: Both ICA siphons are patent. Abundant calcified plaque bilaterally. Mild bilateral supraclinoid siphon stenosis results. Normal ophthalmic and left posterior communicating artery origins. Patent carotid termini. Dominant right ACA. The diminutive left A1 segment is stenotic. The right ACA remains dominant throughout. There is moderate stenosis of the right pericallosal artery (series 10, image 34). Left MCA M1 segment is patent without stenosis. Left MCA bifurcation is patent. No left MCA branch occlusion identified. There is widespread distal left MCA branch irregularity. Right MCA M1 segment and bifurcation are patent. There is a right MCA posterior division M2 occlusion just beyond the bifurcation (series 9, image 124). Venous sinuses: Patent. Anatomic variants: Dominant right vertebral artery, the left functionally terminates in PICA. Fetal type left PCA origin. Review of the MIP images confirms the above findings IMPRESSION: 1. Positive for LVO: Right MCA posterior M2. 2. However, CTP does not detect the known posterior right MCA core infarct (ASPECTS 7 versus 8 today), and the  19 mL of CTP penumbra seems to largely correspond to the cytotoxic edema. 3. The above was discussed by telephone with Dr. Ritta Slot on 09/05/2018 at 2102 hours. 4. Additionally, there is extensive intracranial atherosclerosis with significant stenoses: - dominant Right Vertebral Artery V4 segment (moderate). - Basilar Artery (moderate). - Left Pcomm, fetal left PCA origin (severe). - dominant Right ACA pericallosal artery (moderate). 5. Aortic Atherosclerosis (ICD10-I70.0) with some ulcerated plaque in the distal arch. Electronically Signed   By: Odessa Fleming M.D.   On: 09/05/2018 21:41   Ct Angio Neck W Or Wo Contrast  Result Date: 09/05/2018 CLINICAL DATA:  84 year old female code stroke presentation with CT changes of acute right MCA territory infarct. EXAM: CT ANGIOGRAPHY HEAD AND NECK CT PERFUSION BRAIN TECHNIQUE: Multidetector CT imaging of the head and neck was performed using the standard protocol during bolus administration of intravenous contrast. Multiplanar CT image reconstructions and MIPs were obtained to evaluate the vascular anatomy. Carotid stenosis measurements (when applicable) are obtained utilizing NASCET criteria, using the distal internal carotid  diameter as the denominator. Multiphase CT imaging of the brain was performed following IV bolus contrast injection. Subsequent parametric perfusion maps were calculated using RAPID software. CONTRAST:  ISOVUE-370 IOPAMIDOL (ISOVUE-370) INJECTION 76% COMPARISON:  Head CT without contrast 2044 hours. FINDINGS: CT Brain Perfusion Findings: ASPECTS 7 versus 8 on the plain head CT today. CBF (<30%) Volume: 0 (erroneous). Perfusion (Tmax>6.0s) volume: 19mL, much of which seems to correspond to the aspects changes on plain CT. Mismatch Volume: 19mL, but thought to be partially erroneous. Infarction Location:Posterior right MCA territory. CTA NECK Skeleton: Absent maxillary dentition and degenerative changes in the spine. No acute osseous  abnormality identified. Upper chest: Gas trapping and/or atelectasis scattered in the upper lungs. No superior mediastinal lymphadenopathy. Other neck: Incidental venous contrast reflux into prominent lower internal jugular veins. Otherwise negative. Aortic arch: 3 vessel arch configuration. Moderate arch atherosclerosis although primarily distal to the great vessel origins. But furthermore, some of that plaque is ulcerated as seen on series 7, image 157. Right carotid system: Atherosclerosis without stenosis. Left carotid system: Soft plaque in the ventral left CCA without stenosis. Soft and calcified plaque at the left ICA origin and bulb resulting in less than 50 % stenosis with respect to the distal vessel. Vertebral arteries: Right subclavian artery origin plaque resulting in 50% stenosis. Normal right vertebral artery origin. Dominant right vertebral artery with intermittent atherosclerosis but no hemodynamically significant stenosis to the skull base. No proximal left subclavian artery stenosis despite plaque. Non dominant left vertebral artery. Mild to moderate V1 segment stenosis. The left vertebral remains small but patent to the skull base. CTA HEAD Posterior circulation: The left vertebral functionally terminates in PICA. Moderate right V4 segment stenosis occurs due to calcified plaque near the right PICA origin which remains patent. Patent vertebrobasilar junction. Moderately irregular and diminutive basilar artery with moderate stenosis just proximal to the SCA origins. Fetal type left PCA origin with severe left posterior communicating artery stenosis (series 10, image 36). Widespread mild irregularity and stenosis of the right PCA branches. Anterior circulation: Both ICA siphons are patent. Abundant calcified plaque bilaterally. Mild bilateral supraclinoid siphon stenosis results. Normal ophthalmic and left posterior communicating artery origins. Patent carotid termini. Dominant right ACA. The  diminutive left A1 segment is stenotic. The right ACA remains dominant throughout. There is moderate stenosis of the right pericallosal artery (series 10, image 34). Left MCA M1 segment is patent without stenosis. Left MCA bifurcation is patent. No left MCA branch occlusion identified. There is widespread distal left MCA branch irregularity. Right MCA M1 segment and bifurcation are patent. There is a right MCA posterior division M2 occlusion just beyond the bifurcation (series 9, image 124). Venous sinuses: Patent. Anatomic variants: Dominant right vertebral artery, the left functionally terminates in PICA. Fetal type left PCA origin. Review of the MIP images confirms the above findings IMPRESSION: 1. Positive for LVO: Right MCA posterior M2. 2. However, CTP does not detect the known posterior right MCA core infarct (ASPECTS 7 versus 8 today), and the 19 mL of CTP penumbra seems to largely correspond to the cytotoxic edema. 3. The above was discussed by telephone with Dr. Ritta Slot on 09/05/2018 at 2102 hours. 4. Additionally, there is extensive intracranial atherosclerosis with significant stenoses: - dominant Right Vertebral Artery V4 segment (moderate). - Basilar Artery (moderate). - Left Pcomm, fetal left PCA origin (severe). - dominant Right ACA pericallosal artery (moderate). 5. Aortic Atherosclerosis (ICD10-I70.0) with some ulcerated plaque in the distal arch. Electronically Signed   By:  Odessa Fleming M.D.   On: 09/05/2018 21:41   Ct Cerebral Perfusion W Contrast  Result Date: 09/05/2018 CLINICAL DATA:  84 year old female code stroke presentation with CT changes of acute right MCA territory infarct. EXAM: CT ANGIOGRAPHY HEAD AND NECK CT PERFUSION BRAIN TECHNIQUE: Multidetector CT imaging of the head and neck was performed using the standard protocol during bolus administration of intravenous contrast. Multiplanar CT image reconstructions and MIPs were obtained to evaluate the vascular anatomy.  Carotid stenosis measurements (when applicable) are obtained utilizing NASCET criteria, using the distal internal carotid diameter as the denominator. Multiphase CT imaging of the brain was performed following IV bolus contrast injection. Subsequent parametric perfusion maps were calculated using RAPID software. CONTRAST:  ISOVUE-370 IOPAMIDOL (ISOVUE-370) INJECTION 76% COMPARISON:  Head CT without contrast 2044 hours. FINDINGS: CT Brain Perfusion Findings: ASPECTS 7 versus 8 on the plain head CT today. CBF (<30%) Volume: 0 (erroneous). Perfusion (Tmax>6.0s) volume: 19mL, much of which seems to correspond to the aspects changes on plain CT. Mismatch Volume: 19mL, but thought to be partially erroneous. Infarction Location:Posterior right MCA territory. CTA NECK Skeleton: Absent maxillary dentition and degenerative changes in the spine. No acute osseous abnormality identified. Upper chest: Gas trapping and/or atelectasis scattered in the upper lungs. No superior mediastinal lymphadenopathy. Other neck: Incidental venous contrast reflux into prominent lower internal jugular veins. Otherwise negative. Aortic arch: 3 vessel arch configuration. Moderate arch atherosclerosis although primarily distal to the great vessel origins. But furthermore, some of that plaque is ulcerated as seen on series 7, image 157. Right carotid system: Atherosclerosis without stenosis. Left carotid system: Soft plaque in the ventral left CCA without stenosis. Soft and calcified plaque at the left ICA origin and bulb resulting in less than 50 % stenosis with respect to the distal vessel. Vertebral arteries: Right subclavian artery origin plaque resulting in 50% stenosis. Normal right vertebral artery origin. Dominant right vertebral artery with intermittent atherosclerosis but no hemodynamically significant stenosis to the skull base. No proximal left subclavian artery stenosis despite plaque. Non dominant left vertebral artery. Mild to  moderate V1 segment stenosis. The left vertebral remains small but patent to the skull base. CTA HEAD Posterior circulation: The left vertebral functionally terminates in PICA. Moderate right V4 segment stenosis occurs due to calcified plaque near the right PICA origin which remains patent. Patent vertebrobasilar junction. Moderately irregular and diminutive basilar artery with moderate stenosis just proximal to the SCA origins. Fetal type left PCA origin with severe left posterior communicating artery stenosis (series 10, image 36). Widespread mild irregularity and stenosis of the right PCA branches. Anterior circulation: Both ICA siphons are patent. Abundant calcified plaque bilaterally. Mild bilateral supraclinoid siphon stenosis results. Normal ophthalmic and left posterior communicating artery origins. Patent carotid termini. Dominant right ACA. The diminutive left A1 segment is stenotic. The right ACA remains dominant throughout. There is moderate stenosis of the right pericallosal artery (series 10, image 34). Left MCA M1 segment is patent without stenosis. Left MCA bifurcation is patent. No left MCA branch occlusion identified. There is widespread distal left MCA branch irregularity. Right MCA M1 segment and bifurcation are patent. There is a right MCA posterior division M2 occlusion just beyond the bifurcation (series 9, image 124). Venous sinuses: Patent. Anatomic variants: Dominant right vertebral artery, the left functionally terminates in PICA. Fetal type left PCA origin. Review of the MIP images confirms the above findings IMPRESSION: 1. Positive for LVO: Right MCA posterior M2. 2. However, CTP does not detect the known  posterior right MCA core infarct (ASPECTS 7 versus 8 today), and the 19 mL of CTP penumbra seems to largely correspond to the cytotoxic edema. 3. The above was discussed by telephone with Dr. Ritta Slot on 09/05/2018 at 2102 hours. 4. Additionally, there is extensive  intracranial atherosclerosis with significant stenoses: - dominant Right Vertebral Artery V4 segment (moderate). - Basilar Artery (moderate). - Left Pcomm, fetal left PCA origin (severe). - dominant Right ACA pericallosal artery (moderate). 5. Aortic Atherosclerosis (ICD10-I70.0) with some ulcerated plaque in the distal arch. Electronically Signed   By: Odessa Fleming M.D.   On: 09/05/2018 21:41   Ct Head Code Stroke Wo Contrast  Result Date: 09/05/2018 CLINICAL DATA:  Code stroke. 84 year old female with left side weakness. EXAM: CT HEAD WITHOUT CONTRAST TECHNIQUE: Contiguous axial images were obtained from the base of the skull through the vertex without intravenous contrast. COMPARISON:  03/07/2018 head CT. FINDINGS: Brain: Cytotoxic edema in the posterior right MCA territory on series 8, image 15. Chronic but progressed bilateral basal ganglia and external capsule white matter hypodensity since May (series 8, image 16. Other confluent bilateral cerebral white matter hypodensity appears stable. No acute intracranial hemorrhage identified. No midline shift, mass effect, or evidence of intracranial mass lesion. No ventriculomegaly. Vascular: Calcified atherosclerosis at the skull base. No suspicious intracranial vascular hyperdensity. Skull: Negative. Sinuses/Orbits: Paranasal sinuses and mastoids are clear today. Other: No acute orbit or scalp soft tissue finding. ASPECTS [facility] Stroke Program Early CT Score) - Ganglionic level infarction (caudate, lentiform nuclei, internal capsule, insula, M1-M3 cortex): 5 (abnormal right lentiform and M2 segment) - Supraganglionic infarction (M4-M6 cortex): 2 (abnormal right M5 segment). Total score (0-10 with 10 being normal): 7 IMPRESSION: 1. Evidence of acute posterior right MCA territory infarct. No hemorrhage or mass effect. 2. ASPECTS is 7. 3. Other bilateral progressed chronic small vessel disease since May 2019. 4. These results were communicated to Dr. Amada Jupiter at  8:58 pmon 11/16/2019by text page via the [facility] messaging system. Electronically Signed   By: Odessa Fleming M.D.   On: 09/05/2018 20:58    EKG: Independently reviewed.  Assessment/Plan Principal Problem:   Acute ischemic right MCA stroke (HCC) Active Problems:   HTN (hypertension)   Hypothyroidism   HLD (hyperlipidemia)    1. Acute ischemic R MCA stroke - 1. Neuro consult in chart 2. Stroke pathway 3. MRI brain 4. 2d echo 5. ASA 325 6. PT/OT/SLP 7. Frequent neuro checks 8. A1C, FLP 2. Possible UTI - 1. UCx pending 2. Holding off on ABx for the moment since its not clear from UA, vitals, or labs that she has UTI. 3. HTN - holding home BP meds to allow permissive HTN 4. HLD - continue statin when home med rec completed 5. Hypothyroidism - continue synthroid when home med rec completed  DVT prophylaxis: Lovenox Code Status: Full Family Communication: No family in room Disposition Plan: TBD Consults called: Neuro Admission status: Admit to inpatient  Severity of Illness: The appropriate patient status for this patient is INPATIENT. Inpatient status is judged to be reasonable and necessary in order to provide the required intensity of service to ensure the patient's safety. The patient's presenting symptoms, physical exam findings, and initial radiographic and laboratory data in the context of their chronic comorbidities is felt to place them at high risk for further clinical deterioration. Furthermore, it is not anticipated that the patient will be medically stable for discharge from the hospital within 2 midnights of admission. The following factors support the  patient status of inpatient.   " The patient's presenting symptoms include L sided weakness with some neglect. " The worrisome physical exam findings include L sided weakness. " The initial radiographic and laboratory data are worrisome because of Stroke confirmed on CT perfusion study with acute occlusion of R MCA M2. " The  chronic co-morbidities include HTN, HLD.   * I certify that at the point of admission it is my clinical judgment that the patient will require inpatient hospital care spanning beyond 2 midnights from the point of admission due to high intensity of service, high risk for further deterioration and high frequency of surveillance required.Hillary Bow DO [facility] Pager [phone] Only works nights!  If 7AM-7PM, please contact the primary day team physician taking care of patient  [email or web address] Password [email or web address]  09/05/2018, 11:19 PM

## 2018-09-06 ENCOUNTER — Inpatient Hospital Stay (HOSPITAL_COMMUNITY): Payer: Medicare HMO

## 2018-09-06 DIAGNOSIS — I63411 Cerebral infarction due to embolism of right middle cerebral artery: Principal | ICD-10-CM

## 2018-09-06 DIAGNOSIS — I370 Nonrheumatic pulmonary valve stenosis: Secondary | ICD-10-CM

## 2018-09-06 DIAGNOSIS — I361 Nonrheumatic tricuspid (valve) insufficiency: Secondary | ICD-10-CM

## 2018-09-06 LAB — LIPID PANEL
Cholesterol: 227 mg/dL — ABNORMAL HIGH (ref 0–200)
HDL: 45 mg/dL (ref >40)
LDL Cholesterol: 165 mg/dL — ABNORMAL HIGH (ref 0–99)
Total CHOL/HDL Ratio: 5 RATIO
Triglycerides: 86 mg/dL (ref <150)
VLDL: 17 mg/dL (ref 0–40)

## 2018-09-06 LAB — CK: Total CK: 316 U/L — ABNORMAL HIGH (ref 38–234)

## 2018-09-06 LAB — ECHOCARDIOGRAM COMPLETE
Height: 64 in
Weight: 3037.06 oz

## 2018-09-06 LAB — HEMOGLOBIN A1C
Hgb A1c MFr Bld: 5.3 % (ref 4.8–5.6)
Mean Plasma Glucose: 105.41 mg/dL

## 2018-09-06 MED ORDER — LORAZEPAM 2 MG/ML IJ SOLN: 1.0000 mg | Freq: Once | INTRAMUSCULAR | Status: DC

## 2018-09-06 MED ORDER — CLOPIDOGREL BISULFATE 75 MG PO TABS
75.0000 mg | ORAL_TABLET | Freq: Every day | ORAL | Status: DC
  Administered 2018-09-06 – 2018-09-15 (×10): 75 mg via ORAL
  Filled 2018-09-06 (×10): qty 1

## 2018-09-06 MED ORDER — ATORVASTATIN CALCIUM 40 MG PO TABS
40.0000 mg | ORAL_TABLET | Freq: Every day | ORAL | Status: DC
  Administered 2018-09-06 – 2018-09-14 (×9): 40 mg via ORAL
  Filled 2018-09-06 (×9): qty 1

## 2018-09-06 NOTE — Evaluation (Signed)
Physical Therapy Evaluation Patient Details Name: Wendy Andrews MRN: [id number] DOB: December 23, 1929 Today's Date: 09/06/2018   History of Present Illness  Wendy Andrews is a 84 y.o. female with medical history significant of HTN, HLD.  Patient has been having L sided weakness present since at least 11/15.  Exact time of onset unclear.  Patient was stumbling around at that time she says.  11/16, family hadnt heard from patient so they asked for a wellfare check.  EMS found patient on floor trying to get to door. Neurology impression: with moderate right MCA distribution infarct.  Clinical Impression   Pt admitted with above diagnosis. Pt currently with functional limitations due to the deficits listed below (see PT Problem List). Independent, living alone prior to admission; Presents with L neglect, significant fall risk, L hemiparesis; Would like to get more information re: how much assist is available to her once home, because she is an excellent rehab candidate; will place CIR screen;  Pt will benefit from skilled PT to increase their independence and safety with mobility to allow discharge to the venue listed below.       Follow Up Recommendations CIR(for post-acute rehabiliatation)    Equipment Recommendations  Rolling walker with 5" wheels;3in1 (PT)    Recommendations for Other Services Rehab consult     Precautions / Restrictions Precautions Precautions: Fall Restrictions Weight Bearing Restrictions: No      Mobility  Bed Mobility Overal bed mobility: Needs Assistance Bed Mobility: Supine to Sit     Supine to sit: Mod assist;HOB elevated     General bed mobility comments: towards R side, mod assist for trunk support/forward scoot, stability and balance  Transfers Overall transfer level: Needs assistance Equipment used: Rolling walker (2 wheeled);None Transfers: Sit to/from [employer] Sit to Stand: Mod assist;Max assist;+2 physical assistance;+2  safety/equipment Stand pivot transfers: Mod assist;Max assist;+2 safety/equipment;+2 physical assistance       General transfer comment: pt level of assist with transfers fluctuating, requires mod assist +2  to power up into standing but increases to max assist +2 for balance and stability to maintain erect and midline posture, and stability   Ambulation/Gait Ambulation/Gait assistance: +2 physical assistance;Mod assist Gait Distance (Feet): 3 Feet(BSC to reclienr) Assistive device: Rolling walker (2 wheeled) Gait Pattern/deviations: Decreased step length - left     General Gait Details: Tended to have RW too far in front, and with decr L step length; +2 for safety as she is at a very high fall risk  Stairs            Wheelchair Mobility    Modified Rankin (Stroke Patients Only) Modified Rankin (Stroke Patients Only) Pre-Morbid Rankin Score: No symptoms Modified Rankin: Moderately severe disability     Balance Overall balance assessment: Needs assistance Sitting-balance support: No upper extremity supported;Feet supported Sitting balance-Leahy Scale: Poor Sitting balance - Comments: L lateral lean and min assist required sitting EOB during ADls Postural control: Left lateral lean Standing balance support: Single extremity supported;During functional activity Standing balance-Leahy Scale: Poor Standing balance comment: reliant on B UE and external support                             Pertinent Vitals/Pain Pain Assessment: No/denies pain    Home Living Family/patient expects to be discharged to:: Private residence Living Arrangements: Alone Available Help at Discharge: Family Type of Home: House Home Access: Stairs to enter Entrance Stairs-Rails: Right;Left Entrance  Stairs-Number of Steps: 3 Home Layout: One level Home Equipment: Cane - quad;Shower seat      Prior Function Level of Independence: Independent with assistive device(s)          Comments: reports independent and driving, limited iADLs (unsure of accuracy of history )     Hand Dominance   Dominant Hand: Right    Extremity/Trunk Assessment   Upper Extremity Assessment Upper Extremity Assessment: Defer to OT evaluation LUE Deficits / Details: 3-/5 MMT grossly, able to use as gross stabilizer, inattention to UE LUE Sensation: decreased proprioception LUE Coordination: decreased fine motor;decreased gross motor    Lower Extremity Assessment Lower Extremity Assessment: LLE deficits/detail LLE Deficits / Details: grossly 3+/5 hip flexors, knee extensors; tending to have dec step length       Communication   Communication: No difficulties  Cognition Arousal/Alertness: Awake/alert Behavior During Therapy: Impulsive Overall Cognitive Status: No family/caregiver present to determine baseline cognitive functioning Area of Impairment: Attention;Memory;Following commands;Safety/judgement;Awareness;Problem solving                   Current Attention Level: Sustained Memory: Decreased recall of precautions;Decreased short-term memory Following Commands: Follows one step commands inconsistently;Follows one step commands with increased time Safety/Judgement: Decreased awareness of safety;Decreased awareness of deficits Awareness: Emergent Problem Solving: Slow processing;Decreased initiation;Requires verbal cues;Difficulty sequencing;Requires tactile cues        General Comments      Exercises     Assessment/Plan    PT Assessment Patient needs continued PT services  PT Problem List Decreased strength;Decreased range of motion;Decreased activity tolerance;Decreased balance;Decreased mobility;Decreased coordination;Decreased cognition;Decreased knowledge of use of DME;Decreased safety awareness;Decreased knowledge of precautions       PT Treatment Interventions DME instruction;Gait training;Functional mobility training;Therapeutic  activities;Therapeutic exercise;Balance training;Neuromuscular re-education;Cognitive remediation;Patient/family education    PT Goals (Current goals can be found in the Care Plan section)  Acute Rehab PT Goals Patient Stated Goal: to get stronger PT Goal Formulation: With patient Time For Goal Achievement: 09/20/18 Potential to Achieve Goals: Good    Frequency Min 4X/week   Barriers to discharge   Need to find out how much family assist is available to [employer] PT/OT/SLP Co-Evaluation/Treatment: Yes Reason for Co-Treatment: Complexity of the patient's impairments (multi-system involvement);For patient/therapist safety;To address functional/ADL transfers PT goals addressed during session: Mobility/safety with mobility OT goals addressed during session: ADL's and self-care       AM-PAC PT "6 Clicks" Daily Activity  Outcome Measure Difficulty turning over in bed (including adjusting bedclothes, sheets and blankets)?: A Little Difficulty moving from lying on back to sitting on the side of the bed? : Unable Difficulty sitting down on and standing up from a chair with arms (e.g., wheelchair, bedside commode, etc,.)?: Unable Help needed moving to and from a bed to chair (including a wheelchair)?: A Lot Help needed walking in hospital room?: A Lot Help needed climbing 3-5 steps with a railing? : Total 6 Click Score: 10    End of Session Equipment Utilized During Treatment: Gait belt Activity Tolerance: Patient tolerated treatment well Patient left: in chair;with call bell/phone within reach;with chair alarm set Nurse Communication: Mobility status PT Visit Diagnosis: Unsteadiness on feet (R26.81);Other abnormalities of gait and mobility (R26.89);Hemiplegia and hemiparesis Hemiplegia - Right/Left: Left Hemiplegia - dominant/non-dominant: Non-dominant Hemiplegia - caused by: Cerebral infarction    Time: 1610-9604 PT Time Calculation (min) (ACUTE ONLY): 33  min   Charges:   PT Evaluation $PT Eval Moderate Complexity: 1 Mod  Van ClinesHolly Madelene Kaatz, [place]  Acute Rehabilitation Services Pager [phone] Office [phone]   Levi AlandHolly H Aqsa Sensabaugh 09/06/2018, 3:58 PM

## 2018-09-06 NOTE — Progress Notes (Signed)
Rehab Admissions Coordinator Note:  Patient was screened by Clois DupesBoyette, Emmersyn Kratzke Godwin for appropriateness for an Inpatient Acute Rehab Consult.  At this time, we are recommending Inpatient Rehab consult. Please place order for consult.  Clois DupesBoyette, Kyren Vaux Godwin 09/06/2018, 4:41 PM  I can be reached at [phone].

## 2018-09-06 NOTE — Progress Notes (Signed)
Pt. transported from ER via stretcher to bed in 3W-29; alert and oriented x3, and disoriented to time. Oriented pt. to room and call button.

## 2018-09-06 NOTE — Progress Notes (Signed)
PROGRESS NOTE    Wendy Andrews  ZOX:[id number] DOB: Jan 26, 1930 DOA: 09/05/2018 PCP: Burton Apley, MD   Brief Narrative:  HPI on 09/05/2018 by Dr. Lyda Perone Wendy Andrews is a 84 y.o. female with medical history significant of HTN, HLD.  Patient has been having L sided weakness present since at least yesterday.  Exact time of onset unclear.  Patient was stumbling around yesterday she says.  Today family hadnt heard from patient so they asked for a wellfare check.  EMS found patient on floor trying to get to door.  Interim history Admitted for acute CVA. Neurology consulted. Pending workup Assessment & Plan   Acute CVA -Patient presented with left-sided weakness -Head showed acute posterior right MCA territory infarct.  No hemorrhage or mass-effect. -CTA head shows positive for LVO: Right MCA posterior M2. -MRI brain: Shows moderate sized acute posterior right MCA infarct.  No hemorrhage. -LDL 165, hemoglobin A1c 5.3 -Echocardiogram pending -Neurology consulted and appreciated, pending recommendations -Pending PT OT consultations -Placed on statin, aspirin, plavix   Asymptomatic bacteriuria  -UA rare bacteria, 21-50 WBC, negative nitrites, small leukocytes -Urine culture pending -With mild leukocytosis, 13.2.  This may be reactive to her CVA  -Antibiotics currently held for now as patient does not have any complaints.  She is currently afebrile  Essential hypertension -Home medications held, allowing for permissive hypertension  Hyperlipidemia -Continue statin  Hypothyroidism -will restart synthroid pending medrec   HOME MEDS UNKNOWN, waiting for pharmacy to open on 11/18 to confirm medications.   DVT Prophylaxis  lovenox  Code Status: Full  Family Communication: none at bedside  Disposition Plan: Admitted, pending workup  Consultants Neurology  Procedures  None  Antibiotics   Anti-infectives (From admission, onward)   None      Subjective:     Wendy Andrews seen and examined today.  No complaints this morning.  Denies current chest pain, shortness breath, abdominal pain, nausea or vomiting, diarrhea or constipation. Objective:   Vitals:   09/06/18 0113 09/06/18 0310 09/06/18 0509 09/06/18 0653  BP: (!) 160/71 (!) 161/78 (!) 151/71 (!) 161/71  Pulse: 77 80 70 70  Resp: 16 20 18 18   Temp: 98.4 F (36.9 C) 98.2 F (36.8 C) 98.7 F (37.1 C) 98.2 F (36.8 C)  TempSrc: Oral Oral Oral Oral  SpO2:  99% 94% 96%  Weight:      Height:       No intake or output data in the 24 hours ending 09/06/18 1201 Filed Weights   09/05/18 2116  Weight: 86.1 kg    Exam  General: Well developed, well nourished, NAD, appears stated age  HEENT: NCAT, mucous membranes moist.   Neck: Supple  Cardiovascular: S1 S2 auscultated, RRR  Respiratory: Clear to auscultation bilaterally with equal chest rise  Abdomen: Soft, nontender, nondistended, + bowel sounds  Extremities: warm dry without cyanosis clubbing or edema  Neuro: AAOx3 (person, place, situation, disoriented to time), left sided weakness  Psych: Normal affect and demeanor    Data Reviewed: I have personally reviewed following labs and imaging studies  CBC: Recent Labs  Lab 09/05/18 2038  WBC 13.2*  NEUTROABS 10.4*  HGB 14.6  HCT 46.3*  MCV 80.2  PLT 274   Basic Metabolic Panel: Recent Labs  Lab 09/05/18 2038  NA 134*  K 3.1*  CL 103  CO2 19*  GLUCOSE 129*  BUN 23  CREATININE 1.51*  CALCIUM 9.1   GFR: Estimated Creatinine Clearance: 28.4 mL/min (A) (by C-G  formula based on SCr of 1.51 mg/dL (H)). Liver Function Tests: Recent Labs  Lab 09/05/18 2038  AST 32  ALT 19  ALKPHOS 84  BILITOT 1.1  PROT 7.0  ALBUMIN 4.0   No results for input(s): LIPASE, AMYLASE in the last 168 hours. No results for input(s): AMMONIA in the last 168 hours. Coagulation Profile: Recent Labs  Lab 09/05/18 2038  INR 1.06   Cardiac Enzymes: Recent Labs  Lab  09/06/18 0443  CKTOTAL 316*   BNP (last 3 results) No results for input(s): PROBNP in the last 8760 hours. HbA1C: Recent Labs    09/06/18 0443  HGBA1C 5.3   CBG: No results for input(s): GLUCAP in the last 168 hours. Lipid Profile: Recent Labs    09/06/18 0443  CHOL 227*  HDL 45  LDLCALC 165*  TRIG 86  CHOLHDL 5.0   Thyroid Function Tests: No results for input(s): TSH, T4TOTAL, FREET4, T3FREE, THYROIDAB in the last 72 hours. Anemia Panel: No results for input(s): VITAMINB12, FOLATE, FERRITIN, TIBC, IRON, RETICCTPCT in the last 72 hours. Urine analysis:    Component Value Date/Time   COLORURINE YELLOW 09/05/2018 2229   APPEARANCEUR HAZY (A) 09/05/2018 2229   LABSPEC >1.046 (H) 09/05/2018 2229   PHURINE 5.0 09/05/2018 2229   GLUCOSEU NEGATIVE 09/05/2018 2229   HGBUR SMALL (A) 09/05/2018 2229   BILIRUBINUR NEGATIVE 09/05/2018 2229   KETONESUR NEGATIVE 09/05/2018 2229   PROTEINUR NEGATIVE 09/05/2018 2229   UROBILINOGEN 0.2 05/03/2014 2024   NITRITE NEGATIVE 09/05/2018 2229   LEUKOCYTESUR SMALL (A) 09/05/2018 2229   Sepsis Labs: @LABRCNTIP (procalcitonin:4,lacticidven:4)  )No results found for this or any previous visit (from the past 240 hour(s)).    Radiology Studies: Ct Angio Head W Or Wo Contrast  Result Date: 09/05/2018 CLINICAL DATA:  84 year old female code stroke presentation with CT changes of acute right MCA territory infarct. EXAM: CT ANGIOGRAPHY HEAD AND NECK CT PERFUSION BRAIN TECHNIQUE: Multidetector CT imaging of the head and neck was performed using the standard protocol during bolus administration of intravenous contrast. Multiplanar CT image reconstructions and MIPs were obtained to evaluate the vascular anatomy. Carotid stenosis measurements (when applicable) are obtained utilizing NASCET criteria, using the distal internal carotid diameter as the denominator. Multiphase CT imaging of the brain was performed following IV bolus contrast injection.  Subsequent parametric perfusion maps were calculated using RAPID software. CONTRAST:  ISOVUE-370 IOPAMIDOL (ISOVUE-370) INJECTION 76% COMPARISON:  Head CT without contrast 2044 hours. FINDINGS: CT Brain Perfusion Findings: ASPECTS 7 versus 8 on the plain head CT today. CBF (<30%) Volume: 0 (erroneous). Perfusion (Tmax>6.0s) volume: 19mL, much of which seems to correspond to the aspects changes on plain CT. Mismatch Volume: 19mL, but thought to be partially erroneous. Infarction Location:Posterior right MCA territory. CTA NECK Skeleton: Absent maxillary dentition and degenerative changes in the spine. No acute osseous abnormality identified. Upper chest: Gas trapping and/or atelectasis scattered in the upper lungs. No superior mediastinal lymphadenopathy. Other neck: Incidental venous contrast reflux into prominent lower internal jugular veins. Otherwise negative. Aortic arch: 3 vessel arch configuration. Moderate arch atherosclerosis although primarily distal to the great vessel origins. But furthermore, some of that plaque is ulcerated as seen on series 7, image 157. Right carotid system: Atherosclerosis without stenosis. Left carotid system: Soft plaque in the ventral left CCA without stenosis. Soft and calcified plaque at the left ICA origin and bulb resulting in less than 50 % stenosis with respect to the distal vessel. Vertebral arteries: Right subclavian artery origin plaque resulting  in 50% stenosis. Normal right vertebral artery origin. Dominant right vertebral artery with intermittent atherosclerosis but no hemodynamically significant stenosis to the skull base. No proximal left subclavian artery stenosis despite plaque. Non dominant left vertebral artery. Mild to moderate V1 segment stenosis. The left vertebral remains small but patent to the skull base. CTA HEAD Posterior circulation: The left vertebral functionally terminates in PICA. Moderate right V4 segment stenosis occurs due to calcified  plaque near the right PICA origin which remains patent. Patent vertebrobasilar junction. Moderately irregular and diminutive basilar artery with moderate stenosis just proximal to the SCA origins. Fetal type left PCA origin with severe left posterior communicating artery stenosis (series 10, image 36). Widespread mild irregularity and stenosis of the right PCA branches. Anterior circulation: Both ICA siphons are patent. Abundant calcified plaque bilaterally. Mild bilateral supraclinoid siphon stenosis results. Normal ophthalmic and left posterior communicating artery origins. Patent carotid termini. Dominant right ACA. The diminutive left A1 segment is stenotic. The right ACA remains dominant throughout. There is moderate stenosis of the right pericallosal artery (series 10, image 34). Left MCA M1 segment is patent without stenosis. Left MCA bifurcation is patent. No left MCA branch occlusion identified. There is widespread distal left MCA branch irregularity. Right MCA M1 segment and bifurcation are patent. There is a right MCA posterior division M2 occlusion just beyond the bifurcation (series 9, image 124). Venous sinuses: Patent. Anatomic variants: Dominant right vertebral artery, the left functionally terminates in PICA. Fetal type left PCA origin. Review of the MIP images confirms the above findings IMPRESSION: 1. Positive for LVO: Right MCA posterior M2. 2. However, CTP does not detect the known posterior right MCA core infarct (ASPECTS 7 versus 8 today), and the 19 mL of CTP penumbra seems to largely correspond to the cytotoxic edema. 3. The above was discussed by telephone with Dr. Ritta Slot on 09/05/2018 at 2102 hours. 4. Additionally, there is extensive intracranial atherosclerosis with significant stenoses: - dominant Right Vertebral Artery V4 segment (moderate). - Basilar Artery (moderate). - Left Pcomm, fetal left PCA origin (severe). - dominant Right ACA pericallosal artery (moderate). 5.  Aortic Atherosclerosis (ICD10-I70.0) with some ulcerated plaque in the distal arch. Electronically Signed   By: Odessa Fleming M.D.   On: 09/05/2018 21:41   Ct Angio Neck W Or Wo Contrast  Result Date: 09/05/2018 CLINICAL DATA:  84 year old female code stroke presentation with CT changes of acute right MCA territory infarct. EXAM: CT ANGIOGRAPHY HEAD AND NECK CT PERFUSION BRAIN TECHNIQUE: Multidetector CT imaging of the head and neck was performed using the standard protocol during bolus administration of intravenous contrast. Multiplanar CT image reconstructions and MIPs were obtained to evaluate the vascular anatomy. Carotid stenosis measurements (when applicable) are obtained utilizing NASCET criteria, using the distal internal carotid diameter as the denominator. Multiphase CT imaging of the brain was performed following IV bolus contrast injection. Subsequent parametric perfusion maps were calculated using RAPID software. CONTRAST:  ISOVUE-370 IOPAMIDOL (ISOVUE-370) INJECTION 76% COMPARISON:  Head CT without contrast 2044 hours. FINDINGS: CT Brain Perfusion Findings: ASPECTS 7 versus 8 on the plain head CT today. CBF (<30%) Volume: 0 (erroneous). Perfusion (Tmax>6.0s) volume: 19mL, much of which seems to correspond to the aspects changes on plain CT. Mismatch Volume: 19mL, but thought to be partially erroneous. Infarction Location:Posterior right MCA territory. CTA NECK Skeleton: Absent maxillary dentition and degenerative changes in the spine. No acute osseous abnormality identified. Upper chest: Gas trapping and/or atelectasis scattered in the upper lungs. No superior mediastinal  lymphadenopathy. Other neck: Incidental venous contrast reflux into prominent lower internal jugular veins. Otherwise negative. Aortic arch: 3 vessel arch configuration. Moderate arch atherosclerosis although primarily distal to the great vessel origins. But furthermore, some of that plaque is ulcerated as seen on series 7, image  157. Right carotid system: Atherosclerosis without stenosis. Left carotid system: Soft plaque in the ventral left CCA without stenosis. Soft and calcified plaque at the left ICA origin and bulb resulting in less than 50 % stenosis with respect to the distal vessel. Vertebral arteries: Right subclavian artery origin plaque resulting in 50% stenosis. Normal right vertebral artery origin. Dominant right vertebral artery with intermittent atherosclerosis but no hemodynamically significant stenosis to the skull base. No proximal left subclavian artery stenosis despite plaque. Non dominant left vertebral artery. Mild to moderate V1 segment stenosis. The left vertebral remains small but patent to the skull base. CTA HEAD Posterior circulation: The left vertebral functionally terminates in PICA. Moderate right V4 segment stenosis occurs due to calcified plaque near the right PICA origin which remains patent. Patent vertebrobasilar junction. Moderately irregular and diminutive basilar artery with moderate stenosis just proximal to the SCA origins. Fetal type left PCA origin with severe left posterior communicating artery stenosis (series 10, image 36). Widespread mild irregularity and stenosis of the right PCA branches. Anterior circulation: Both ICA siphons are patent. Abundant calcified plaque bilaterally. Mild bilateral supraclinoid siphon stenosis results. Normal ophthalmic and left posterior communicating artery origins. Patent carotid termini. Dominant right ACA. The diminutive left A1 segment is stenotic. The right ACA remains dominant throughout. There is moderate stenosis of the right pericallosal artery (series 10, image 34). Left MCA M1 segment is patent without stenosis. Left MCA bifurcation is patent. No left MCA branch occlusion identified. There is widespread distal left MCA branch irregularity. Right MCA M1 segment and bifurcation are patent. There is a right MCA posterior division M2 occlusion just beyond  the bifurcation (series 9, image 124). Venous sinuses: Patent. Anatomic variants: Dominant right vertebral artery, the left functionally terminates in PICA. Fetal type left PCA origin. Review of the MIP images confirms the above findings IMPRESSION: 1. Positive for LVO: Right MCA posterior M2. 2. However, CTP does not detect the known posterior right MCA core infarct (ASPECTS 7 versus 8 today), and the 19 mL of CTP penumbra seems to largely correspond to the cytotoxic edema. 3. The above was discussed by telephone with Dr. Ritta Slot on 09/05/2018 at 2102 hours. 4. Additionally, there is extensive intracranial atherosclerosis with significant stenoses: - dominant Right Vertebral Artery V4 segment (moderate). - Basilar Artery (moderate). - Left Pcomm, fetal left PCA origin (severe). - dominant Right ACA pericallosal artery (moderate). 5. Aortic Atherosclerosis (ICD10-I70.0) with some ulcerated plaque in the distal arch. Electronically Signed   By: Odessa Fleming M.D.   On: 09/05/2018 21:41   Mr Brain Wo Contrast  Result Date: 09/06/2018 CLINICAL DATA:  Left-sided weakness.  Right MCA infarct. EXAM: MRI HEAD WITHOUT CONTRAST TECHNIQUE: Multiplanar, multiecho pulse sequences of the brain and surrounding structures were obtained without intravenous contrast. COMPARISON:  Head CT, CTA, and CT perfusion 09/05/2018. Brain MRI 05/25/2010. FINDINGS: Multiple sequences are moderately motion degraded despite using faster, more motion resistant imaging protocols. Brain: There is an acute posterior right MCA territory infarct with patchy cortical and white matter restricted diffusion greatest in the parietal lobe. The infarction extends into the posterior right corona radiata, posterior right temporal lobe, and insula and subinsular white matter. There is mild cytotoxic edema without  mass effect or hemorrhage. Patchy to confluent T2 hyperintensities elsewhere in the cerebral white matter bilaterally are nonspecific but  compatible with extensive chronic small vessel ischemic disease. Chronic lacunar infarcts are present in the basal ganglia bilaterally. Cerebral atrophy is not greater than expected for age. No mass, midline shift, or extra-axial fluid collection is seen. Vascular: FLAIR hyperintensity anteriorly in the right sylvian fissure corresponding to M2 occlusion on CTA. Hypoplastic left vertebral artery. Skull and upper cervical spine: Unremarkable bone marrow signal. Sinuses/Orbits: Bilateral cataract extraction. Trace right mastoid effusion. Clear paranasal sinuses. Other: None. IMPRESSION: 1. Moderate-sized acute posterior right MCA infarct.  No hemorrhage. 2. Extensive chronic small vessel ischemic disease. Electronically Signed   By: Sebastian Ache M.D.   On: 09/06/2018 09:02   Ct Cerebral Perfusion W Contrast  Result Date: 09/05/2018 CLINICAL DATA:  84 year old female code stroke presentation with CT changes of acute right MCA territory infarct. EXAM: CT ANGIOGRAPHY HEAD AND NECK CT PERFUSION BRAIN TECHNIQUE: Multidetector CT imaging of the head and neck was performed using the standard protocol during bolus administration of intravenous contrast. Multiplanar CT image reconstructions and MIPs were obtained to evaluate the vascular anatomy. Carotid stenosis measurements (when applicable) are obtained utilizing NASCET criteria, using the distal internal carotid diameter as the denominator. Multiphase CT imaging of the brain was performed following IV bolus contrast injection. Subsequent parametric perfusion maps were calculated using RAPID software. CONTRAST:  ISOVUE-370 IOPAMIDOL (ISOVUE-370) INJECTION 76% COMPARISON:  Head CT without contrast 2044 hours. FINDINGS: CT Brain Perfusion Findings: ASPECTS 7 versus 8 on the plain head CT today. CBF (<30%) Volume: 0 (erroneous). Perfusion (Tmax>6.0s) volume: 19mL, much of which seems to correspond to the aspects changes on plain CT. Mismatch Volume: 19mL, but  thought to be partially erroneous. Infarction Location:Posterior right MCA territory. CTA NECK Skeleton: Absent maxillary dentition and degenerative changes in the spine. No acute osseous abnormality identified. Upper chest: Gas trapping and/or atelectasis scattered in the upper lungs. No superior mediastinal lymphadenopathy. Other neck: Incidental venous contrast reflux into prominent lower internal jugular veins. Otherwise negative. Aortic arch: 3 vessel arch configuration. Moderate arch atherosclerosis although primarily distal to the great vessel origins. But furthermore, some of that plaque is ulcerated as seen on series 7, image 157. Right carotid system: Atherosclerosis without stenosis. Left carotid system: Soft plaque in the ventral left CCA without stenosis. Soft and calcified plaque at the left ICA origin and bulb resulting in less than 50 % stenosis with respect to the distal vessel. Vertebral arteries: Right subclavian artery origin plaque resulting in 50% stenosis. Normal right vertebral artery origin. Dominant right vertebral artery with intermittent atherosclerosis but no hemodynamically significant stenosis to the skull base. No proximal left subclavian artery stenosis despite plaque. Non dominant left vertebral artery. Mild to moderate V1 segment stenosis. The left vertebral remains small but patent to the skull base. CTA HEAD Posterior circulation: The left vertebral functionally terminates in PICA. Moderate right V4 segment stenosis occurs due to calcified plaque near the right PICA origin which remains patent. Patent vertebrobasilar junction. Moderately irregular and diminutive basilar artery with moderate stenosis just proximal to the SCA origins. Fetal type left PCA origin with severe left posterior communicating artery stenosis (series 10, image 36). Widespread mild irregularity and stenosis of the right PCA branches. Anterior circulation: Both ICA siphons are patent. Abundant calcified  plaque bilaterally. Mild bilateral supraclinoid siphon stenosis results. Normal ophthalmic and left posterior communicating artery origins. Patent carotid termini. Dominant right ACA. The diminutive left  A1 segment is stenotic. The right ACA remains dominant throughout. There is moderate stenosis of the right pericallosal artery (series 10, image 34). Left MCA M1 segment is patent without stenosis. Left MCA bifurcation is patent. No left MCA branch occlusion identified. There is widespread distal left MCA branch irregularity. Right MCA M1 segment and bifurcation are patent. There is a right MCA posterior division M2 occlusion just beyond the bifurcation (series 9, image 124). Venous sinuses: Patent. Anatomic variants: Dominant right vertebral artery, the left functionally terminates in PICA. Fetal type left PCA origin. Review of the MIP images confirms the above findings IMPRESSION: 1. Positive for LVO: Right MCA posterior M2. 2. However, CTP does not detect the known posterior right MCA core infarct (ASPECTS 7 versus 8 today), and the 19 mL of CTP penumbra seems to largely correspond to the cytotoxic edema. 3. The above was discussed by telephone with Dr. Ritta SlotMCNEILL KIRKPATRICK on 09/05/2018 at 2102 hours. 4. Additionally, there is extensive intracranial atherosclerosis with significant stenoses: - dominant Right Vertebral Artery V4 segment (moderate). - Basilar Artery (moderate). - Left Pcomm, fetal left PCA origin (severe). - dominant Right ACA pericallosal artery (moderate). 5. Aortic Atherosclerosis (ICD10-I70.0) with some ulcerated plaque in the distal arch. Electronically Signed   By: Odessa FlemingH  Hall M.D.   On: 09/05/2018 21:41   Dg Chest Port 1 View  Result Date: 09/06/2018 CLINICAL DATA:  Stroke EXAM: PORTABLE CHEST 1 VIEW COMPARISON:  11/18/2017 FINDINGS: There are low lung volumes. There is no focal consolidation. There are prominent interstitial markings at the lung bases likely secondary to low lung volumes.  There is no pleural effusion or pneumothorax. The heart and mediastinal contours are unremarkable. 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 There is moderate osteoarthritis of the right glenohumeral joint. IMPRESSION: No active disease. Electronically Signed   By: Elige KoHetal  Patel   On: 09/06/2018 00:10   Ct Head Code Stroke Wo Contrast  Result Date: 09/05/2018 CLINICAL DATA:  Code stroke. 84 year old female with left side weakness. EXAM: CT HEAD WITHOUT CONTRAST TECHNIQUE: Contiguous axial images were obtained from the base of the skull through the vertex without intravenous contrast. COMPARISON:  03/07/2018 head CT. FINDINGS: Brain: Cytotoxic edema in the posterior right MCA territory on series 8, image 3819. Chronic but progressed bilateral basal ganglia and external capsule white matter hypodensity since May (series 8, image 16. Other confluent bilateral cerebral white matter hypodensity appears stable. No acute intracranial hemorrhage identified. No midline shift, mass effect, or evidence of intracranial mass lesion. No ventriculomegaly. Vascular: Calcified atherosclerosis at the skull base. No suspicious intracranial vascular hyperdensity. Skull: Negative. Sinuses/Orbits: Paranasal sinuses and mastoids are clear today. Other: No acute orbit or scalp soft tissue finding. ASPECTS [facility]([place] Stroke Program Early CT Score) - Ganglionic level infarction (caudate, lentiform nuclei, internal capsule, insula, M1-M3 cortex): 5 (abnormal right lentiform and M2 segment) - Supraganglionic infarction (M4-M6 cortex): 2 (abnormal right M5 segment). Total score (0-10 with 10 being normal): 7 IMPRESSION: 1. Evidence of acute posterior right MCA territory infarct. No hemorrhage or mass effect. 2. ASPECTS is 7. 3. Other bilateral progressed chronic small vessel disease since May 2019. 4. These results were communicated to Dr. Amada JupiterKirkpatrick at 8:58 pmon 11/16/2019by text page via the [facility].  Electronically Signed   By: Odessa FlemingH  Hall M.D.   On: 09/05/2018 20:58     Scheduled Meds: .  stroke: mapping our early stages of recovery book   Does not apply Once  . aspirin  300 mg Rectal Daily   Or  . aspirin  325 mg Oral Daily  . atorvastatin  40 mg Oral q1800  . clopidogrel  75 mg Oral Daily  . enoxaparin (LOVENOX) injection  30 mg Subcutaneous Q24H  . LORazepam  1 mg Intravenous Once   Continuous Infusions:   LOS: 1 day   Time Spent in minutes   30 minutes  Jimmylee Ratterree D.O. on 09/06/2018 at 12:01 PM  Between 7am to 7pm - Please see pager noted on [email or web address]  After 7pm go to [email or web address]  And look for the night coverage person covering for me after hours  [facility] Office  [phone]

## 2018-09-06 NOTE — ED Notes (Signed)
Attempted to call report but nurse was busy with another patient.  Requested this RN's number said she'd call back when she could take report.

## 2018-09-06 NOTE — Progress Notes (Signed)
STROKE TEAM PROGRESS NOTE   SUBJECTIVE (INTERVAL HISTORY) Her nephew and niece in law are at the bedside.  Patient sitting in chair, AAO x3, still has mild left hemiparesis and left visual simultanagnosia.  MRI showed right MCA infarct, discussed with patient regarding TEE and loop recorder procedure.  She would like to proceed.    OBJECTIVE Vitals:   09/06/18 0113 09/06/18 0310 09/06/18 0509 09/06/18 0653  BP: (!) 160/71 (!) 161/78 (!) 151/71 (!) 161/71  Pulse: 77 80 70 70  Resp: 16 20 18 18   Temp: 98.4 F (36.9 C) 98.2 F (36.8 C) 98.7 F (37.1 C) 98.2 F (36.8 C)  TempSrc: Oral Oral Oral Oral  SpO2:  99% 94% 96%  Weight:      Height:        CBC:  Recent Labs  Lab 09/05/18 2038  WBC 13.2*  NEUTROABS 10.4*  HGB 14.6  HCT 46.3*  MCV 80.2  PLT 274    Basic Metabolic Panel:  Recent Labs  Lab 09/05/18 2038  NA 134*  K 3.1*  CL 103  CO2 19*  GLUCOSE 129*  BUN 23  CREATININE 1.51*  CALCIUM 9.1    Lipid Panel:     Component Value Date/Time   CHOL 227 (H) 09/06/2018 0443   TRIG 86 09/06/2018 0443   HDL 45 09/06/2018 0443   CHOLHDL 5.0 09/06/2018 0443   VLDL 17 09/06/2018 0443   LDLCALC 165 (H) 09/06/2018 0443   HgbA1c:  Lab Results  Component Value Date   HGBA1C 5.3 09/06/2018   Urine Drug Screen:     Component Value Date/Time   LABOPIA NONE DETECTED 09/05/2018 2229   COCAINSCRNUR NONE DETECTED 09/05/2018 2229   LABBENZ NONE DETECTED 09/05/2018 2229   AMPHETMU NONE DETECTED 09/05/2018 2229   THCU NONE DETECTED 09/05/2018 2229   LABBARB NONE DETECTED 09/05/2018 2229    Alcohol Level     Component Value Date/Time   ETH <10 09/05/2018 2038    IMAGING  Ct Angio Head W Or Wo Contrast Ct Angio Neck W Or Wo Contrast Ct Cerebral Perfusion W Contrast 09/05/2018 IMPRESSION:  1. Positive for LVO: Right MCA posterior M2.  2. However, CTP does not detect the known posterior right MCA core infarct (ASPECTS 7 versus 8 today), and the 19 mL of CTP  penumbra seems to largely correspond to the cytotoxic edema.  3. Additionally, there is extensive intracranial atherosclerosis with significant stenoses: - dominant Right Vertebral Artery V4 segment (moderate). - Basilar Artery (moderate). - Left Pcomm, fetal left PCA origin (severe). - dominant Right ACA pericallosal artery (moderate).  4. Aortic Atherosclerosis (ICD10-I70.0) with some ulcerated plaque in the distal arch.    Mr Brain Wo Contrast 09/06/2018 IMPRESSION:  1. Moderate-sized acute posterior right MCA infarct.  No hemorrhage.  2. Extensive chronic small vessel ischemic disease.   Ct Head Code Stroke Wo Contrast 09/05/2018 IMPRESSION:  1. Evidence of acute posterior right MCA territory infarct. No hemorrhage or mass effect.  2. ASPECTS is 7.  3. Other bilateral progressed chronic small vessel disease since May 2019.    Transthoracic Echocardiogram  - Left ventricle: The cavity size was normal. Wall thickness was   increased in a pattern of mild LVH. Systolic function was normal.   The estimated ejection fraction was in the range of 60% to 65%.   Wall motion was normal; there were no regional wall motion   abnormalities. The study is not technically sufficient to allow   evaluation of  LV diastolic function. - Mitral valve: Moderately calcified annulus. Severely thickened,   severely calcified leaflets . - Atrial septum: There was increased thickness of the septum,   consistent with lipomatous hypertrophy.   PHYSICAL EXAM  Temp:  [97.8 F (36.6 C)-98.7 F (37.1 C)] 97.9 F (36.6 C) (11/17 1238) Pulse Rate:  [70-96] 86 (11/17 1238) Resp:  [15-23] 18 (11/17 1238) BP: (145-174)/(71-108) 145/99 (11/17 1238) SpO2:  [94 %-99 %] 97 % (11/17 1238) Weight:  [86.1 kg] 86.1 kg (11/16 2116)  General - obese, well developed, in no apparent distress.  Ophthalmologic - fundi not visualized due to noncooperation.  Cardiovascular - Regular rate and rhythm.  Mental Status -   Level of arousal and orientation to time, place, and person were intact. Language including expression, naming, repetition, comprehension was assessed and found intact. Attention span and concentration were normal. Recent and remote memory were intact. Fund of Knowledge was assessed and was intact.  Cranial Nerves II - XII - II - Visual field intact OU, however, left simultanagnosia. III, IV, VI - Extraocular movements intact. V - Facial sensation intact bilaterally. VII - Facial movement intact bilaterally. VIII - Hearing & vestibular intact bilaterally. X - Palate elevates symmetrically. XI - Chin turning & shoulder shrug intact bilaterally. XII - Tongue protrusion intact.  Motor Strength - The patient's strength was normal in right upper and lower extremities, however, left upper extremity 4+/5 with left hand dexterity difficulty and left lower extremity 4/5.  Bulk was normal and fasciculations were absent.   Motor Tone - Muscle tone was assessed at the neck and appendages and was normal.  Reflexes - The patient's reflexes were symmetrical in all extremities and she had no pathological reflexes.  Sensory - Light touch, temperature/pinprick were assessed and were symmetrical.    Coordination - The patient had normal movements in the hands with no ataxia or dysmetria.  Tremor was absent.  Gait and Station - deferred.   ASSESSMENT/PLAN Wendy Andrews is a 84 y.o. female with history of hypertension and hyperlipidemia presenting with left-sided weakness, visual neglect and stumbling on walking. She did not receive IV t-PA due to outside window.   Stroke: Right MCA infarct, embolic pattern, source unclear  Resultant left mild hemiparesis, left simultanagnosia  CT head acute posterior right MCA infarct  MRI head -right MCA infarct  CTA H&N - right M2 occlusion, right V4, basilar artery and left P-comm stenosis.  2D Echo - EF 60 to 65%  LE venous Doppler  pending  Recommend TEE and loop recorder for further cardioembolic work-up  LDL -165  HgbA1c 5.3  UDS -negative  VTE prophylaxis -SCDs  No antithrombotic prior to admission, now on aspirin 325 mg daily and clopidogrel 75 mg daily.  Continue aspirin 325 and Plavix 75 DAPT for 3 months and then aspirin alone due to intracranial stenosis.  Patient counseled to be compliant with her antithrombotic medications  Ongoing aggressive stroke risk factor management  Therapy recommendations: CIR  Disposition:  Pending  Hypertension  Stable . Permissive hypertension (OK if < 220/120) but gradually normalize in 5-7 days . Long-term BP goal normotensive  Hyperlipidemia  Lipid lowering medication PTA: None  LDL 165, goal < 70  Current lipid lowering medication: Lipitor 40  Continue statin at discharge  Other Stroke Risk Factors  Advanced age  Obesity, Body mass index is 32.58 kg/m., recommend weight loss, diet and exercise as appropriate   Other Active Problems  UA WBC 21-50, asymptomatic  CKD stage III, creatinine 1.51  Leukocytosis WBC 13.2  Hospital day # 1  Marvel PlanJindong Riyad Keena, MD PhD Stroke Neurology 09/06/2018 6:22 PM   To contact Stroke Continuity provider, please refer to [email or web address]. After hours, contact General Neurology

## 2018-09-06 NOTE — Evaluation (Signed)
Speech Language Pathology Evaluation Patient Details Name: JALEEYA MCNELLY MRN: [id number] DOB: May 05, 1930 Today's Date: 09/06/2018 Time: 4098-1191 SLP Time Calculation (min) (ACUTE ONLY): 28 min  Problem List:  Patient Active Problem List   Diagnosis Date Noted  . Acute ischemic right MCA stroke (HCC) 09/05/2018  . HTN (hypertension) 09/05/2018  . Hypothyroidism 09/05/2018  . HLD (hyperlipidemia) 09/05/2018  . S/P laparoscopic cholecystectomy Feb 2015 12/12/2013   Past Medical History:  Past Medical History:  Diagnosis Date  . Hyperlipidemia   . Hypertension   . Insomnia   . Interstitial cystitis    Past Surgical History:  Past Surgical History:  Procedure Laterality Date  . ABDOMINAL HYSTERECTOMY    . CHOLECYSTECTOMY N/A 12/10/2013   Procedure: LAPAROSCOPIC CHOLECYSTECTOMY WITH INTRAOPERATIVE CHOLANGIOGRAM;  Surgeon: Kandis Cocking, MD;  Location: [facility];  Service: General;  Laterality: N/A;   HPI:  FAYOLA MECKES is a 84 y.o. female with medical history significant of HTN, HLD. Patien lives alone and family was unable to contact. A wellfare check was done and EMS found her down trying to get to door. She has L sided weakness and left sided neglect. MRI showed an acute posterior righ MCA territor infarct.    Assessment / Plan / Recommendation Clinical Impression  Patient has no history of prior stroke. No family present to provide baseline abilities and patient does live alone. Patient has been tolerating a regular diet with thin liquids. Oral motor exam was remarkable and no dysarthria. MOCA was admistered with a score of 14/30 with (>26/30 being [facility]). Patients relative strengths are naming pictures, repeating digits and sentences. Processing speed is reduced and patient is oriented to self, month, year and place, but not day of week.  She is aware of having a stroke, but not the events leading up to stroke. She has deficits in the area of processing speech, memory, problem  solving and safety awareness. Cognitive therapy is reocmmended to address named deficits.      SLP Assessment  SLP Recommendation/Assessment: Patient needs continued Speech Lanaguage Pathology Services SLP Visit Diagnosis: Cognitive communication deficit (R41.841)    Follow Up Recommendations  Skilled Nursing facility    Frequency and Duration min 2x/week  2 weeks      SLP Evaluation Cognition  Overall Cognitive Status: No family/caregiver present to determine baseline cognitive functioning Arousal/Alertness: Awake/alert Orientation Level: Oriented to person;Oriented to place;Oriented to situation Attention: Focused Focused Attention: Impaired Focused Attention Impairment: Verbal basic Alternating Attention: Impaired Alternating Attention Impairment: Verbal basic Memory: Impaired Memory Impairment: Storage deficit;Decreased recall of new information Awareness: Impaired Awareness Impairment: Anticipatory impairment Problem Solving: Impaired Problem Solving Impairment: Functional basic Reasoning: Impaired Reasoning Impairment: Verbal complex Safety/Judgment: Impaired       Comprehension  Auditory Comprehension Overall Auditory Comprehension: Impaired Yes/No Questions: Within Functional Limits Commands: Impaired One Step Basic Commands: 50-74% accurate Two Step Basic Commands: 0-24% accurate Conversation: Simple Interfering Components: Attention;Processing speed;Working Radio broadcast assistant: Optometrist Discrimination: Exceptions to [employer] Pictures: Able in field of 2 Reading Comprehension Reading Status: Impaired Word level: Impaired Sentence Level: Impaired Paragraph Level: Impaired Functional Environmental (signs, name badge): Impaired Interfering Components: Attention;Visual scanning;Left neglect/inattention;Processing time    Expression Expression Primary Mode of Expression:  Verbal Verbal Expression Overall Verbal Expression: Impaired Initiation: No impairment Level of Generative/Spontaneous Verbalization: Sentence Repetition: No impairment Naming: No impairment Pictures: Able in field of 2 Pragmatics: No impairment Interfering Components: Attention Written Expression Dominant Hand: Right Written Expression: Not tested  Oral / Motor  Oral Motor/Sensory Function Overall Oral Motor/Sensory Function: Within functional limits Motor Speech Overall Motor Speech: Appears within functional limits for tasks assessed Respiration: Within functional limits Phonation: Normal Resonance: Within functional limits Articulation: Within functional limitis Intelligibility: Intelligible Motor Planning: Witnin functional limits Motor Speech Errors: Not applicable   GO                    Lindalou HoseSarah J. Tremain Rucinski, MA, CCC-SLP 09/06/2018 2:55 PM

## 2018-09-06 NOTE — Plan of Care (Signed)
  Problem: Education: Goal: Knowledge of General Education information will improve Description Including pain rating scale, medication(s)/side effects and non-pharmacologic comfort measures Outcome: Progressing Note:  POC and orders reviewed with pt.   

## 2018-09-06 NOTE — Evaluation (Addendum)
Occupational Therapy Evaluation Patient Details Name: Wendy Andrews MRN: [id number] DOB: [birth year] Today's Date: 09/06/2018    History of Present Illness Wendy Andrews is a 84 y.o. female with medical history significant of HTN, HLD.  Patient has been having L sided weakness present since at least 11/15.  Exact time of onset unclear.  Patient was stumbling around at that time she says.  11/16, family hadnt heard from patient so they asked for a wellfare check.  EMS found patient on floor trying to get to door. Neurology impression: with moderate right MCA distribution infarct.   Clinical Impression   PTA patient reports independent and driving (although unsure of accuracy of history, as patient is inconsistent with report).  Patient admitted for above and limited by L neglect, L sided hemiparesis (weakness), impaired balance with L lateral lean, impaired coordination, decreased activity tolerance, and impaired cognition (sequencing, safety, problem solving, attention). Further visual assessment needed. Pt requires +2 for safety and physical assist, impulsive at times. Pt requires mod assist for bed mobility, min assist for grooming, min -mod assist for UB ADLs, max assist +2 for LB ADLs, mod-max assist for stand pivot transfers. Believe patient will benefit from continued OT services while admitted and after DC at CIR level in order to optimize independence and return to PLOF. Will continue to follow.     Follow Up Recommendations  CIR    Equipment Recommendations  Other (comment)(TBD at next venue of care)    Recommendations for Other Services Rehab consult     Precautions / Restrictions Precautions Precautions: Fall Restrictions Weight Bearing Restrictions: No      Mobility Bed Mobility Overal bed mobility: Needs Assistance Bed Mobility: Supine to Sit     Supine to sit: Mod assist;HOB elevated     General bed mobility comments: towards R side, mod assist for trunk  support/forward scoot, stability and balance  Transfers Overall transfer level: Needs assistance Equipment used: Rolling walker (2 wheeled);None Transfers: Sit to/from [employer] Sit to Stand: Mod assist;Max assist;+2 physical assistance;+2 safety/equipment Stand pivot transfers: Mod assist;Max assist;+2 safety/equipment;+2 physical assistance       General transfer comment: pt level of assist with transfers fluctuating, requires mod assist +2  to power up into standing but increases to max assist +2 for balance and stability to maintain erect and midline posture, and stability     Balance Overall balance assessment: Needs assistance Sitting-balance support: No upper extremity supported;Feet supported Sitting balance-Leahy Scale: Poor Sitting balance - Comments: L lateral lean and min assist required sitting EOB during ADls Postural control: Left lateral lean Standing balance support: Single extremity supported;During functional activity Standing balance-Leahy Scale: Poor Standing balance comment: reliant on B UE and external support                           ADL either performed or assessed with clinical judgement   ADL Overall ADL's : Needs assistance/impaired     Grooming: Minimal assistance;Sitting   Upper Body Bathing: Minimal assistance;Sitting   Lower Body Bathing: Maximal assistance;+2 for physical assistance;+2 for safety/equipment;Sit to/from stand   Upper Body Dressing : Sitting;Moderate assistance   Lower Body Dressing: Maximal assistance;+2 for physical assistance;Sit to/from stand   Toilet Transfer: Moderate assistance;Maximal assistance;+2 for physical assistance;+2 for safety/equipment;BSC;Stand-pivot Toilet Transfer Details (indicate cue type and reason): fluctuating mod-max assist for safety, balance and physical support Toileting- Clothing Manipulation and Hygiene: Moderate assistance;+2 for physical assistance;+2 for  safety/equipment;Sit to/from stand;Sitting/lateral lean       Functional mobility during ADLs: Maximal assistance;Moderate assistance;+2 for physical assistance;Cueing for safety;Cueing for sequencing General ADL Comments: Patient limited by cognition, safety awareness, impaired balance, L neglect/inattention, and L sided weakness.      Vision Baseline Vision/History: No visual deficits Patient Visual Report: No change from baseline Additional Comments: further assessment required, difficult to assess due to cognition      Perception Perception Perception Tested?: Yes Perception Deficits: Inattention/neglect Inattention/Neglect: Does not attend to left visual field;Does not attend to left side of body   Praxis      Pertinent Vitals/Pain Pain Assessment: No/denies pain     Hand Dominance Right   Extremity/Trunk Assessment Upper Extremity Assessment Upper Extremity Assessment: LUE deficits/detail LUE Deficits / Details: 3-/5 MMT grossly, able to use as gross stabilizer, inattention to UE LUE Sensation: decreased proprioception LUE Coordination: decreased fine motor;decreased gross motor   Lower Extremity Assessment Lower Extremity Assessment: Defer to PT evaluation       Communication Communication Communication: No difficulties   Cognition Arousal/Alertness: Awake/alert Behavior During Therapy: Impulsive Overall Cognitive Status: No family/caregiver present to determine baseline cognitive functioning Area of Impairment: Attention;Memory;Following commands;Safety/judgement;Awareness;Problem solving                   Current Attention Level: Sustained Memory: Decreased recall of precautions;Decreased short-term memory Following Commands: Follows one step commands inconsistently;Follows one step commands with increased time Safety/Judgement: Decreased awareness of safety;Decreased awareness of deficits Awareness: Emergent Problem Solving: Slow  processing;Decreased initiation;Requires verbal cues;Difficulty sequencing;Requires tactile cues     General Comments       Exercises     Shoulder Instructions      Home Living Family/patient expects to be discharged to:: Private residence Living Arrangements: Alone Available Help at Discharge: Family Type of Home: House Home Access: Stairs to enter Secretary/administrator of Steps: 3 Entrance Stairs-Rails: Right;Left Home Layout: One level     Bathroom Shower/Tub: Producer, television/film/video: Standard     Home Equipment: Cane - quad;Shower seat      Lives With: Alone    Prior Functioning/Environment Level of Independence: Independent with assistive device(s)        Comments: reports independent and driving, limited iADLs (unsure of accuracy of history )        OT Problem List: Decreased strength;Decreased activity tolerance;Impaired balance (sitting and/or standing);Decreased coordination;Decreased cognition;Decreased safety awareness;Decreased knowledge of use of DME or AE;Decreased knowledge of precautions;Impaired sensation;Impaired UE functional use;Decreased range of motion;Impaired vision/perception      OT Treatment/Interventions: Self-care/ADL training;Therapeutic exercise;Neuromuscular education;Energy conservation;DME and/or AE instruction;Therapeutic activities;Cognitive remediation/compensation;Visual/perceptual remediation/compensation;Patient/family education;Balance training    OT Goals(Current goals can be found in the care plan section) Acute Rehab OT Goals Patient Stated Goal: to get stronger OT Goal Formulation: With patient Time For Goal Achievement: 09/20/18 Potential to Achieve Goals: Good  OT Frequency: Min 2X/week   Barriers to D/C:            Co-evaluation PT/OT/SLP Co-Evaluation/Treatment: Yes Reason for Co-Treatment: Complexity of the patient's impairments (multi-system involvement);Necessary to address cognition/behavior  during functional activity;For patient/therapist safety;To address functional/ADL transfers   OT goals addressed during session: ADL's and self-care;Strengthening/ROM;Other (comment)(transfers)      AM-PAC PT "6 Clicks" Daily Activity     Outcome Measure Help from another person eating meals?: A Little Help from another person taking care of personal grooming?: A Little(seated) Help from another person toileting, which includes using toliet, bedpan, or  urinal?: Total Help from another person bathing (including washing, rinsing, drying)?: A Lot Help from another person to put on and taking off regular upper body clothing?: A Lot Help from another person to put on and taking off regular lower body clothing?: Total 6 Click Score: 12   End of Session Equipment Utilized During Treatment: Gait belt;Rolling walker Nurse Communication: Mobility status  Activity Tolerance: Patient tolerated treatment well Patient left: in chair;with call bell/phone within reach;with chair alarm set  OT Visit Diagnosis: Other abnormalities of gait and mobility (R26.89);Unsteadiness on feet (R26.81);Muscle weakness (generalized) (M62.81);Other symptoms and signs involving the nervous system (R29.898);Other symptoms and signs involving cognitive function;Hemiplegia and hemiparesis Hemiplegia - Right/Left: Left Hemiplegia - dominant/non-dominant: Non-Dominant Hemiplegia - caused by: Cerebral infarction                Time: 0865-7846: 1308-1332 OT Time Calculation (min): 24 min Charges:  OT General Charges $OT Visit: 1 Visit OT Evaluation $OT Eval Moderate Complexity: 1 Mod  Chancy Milroyhristie S Liller Yohn, OT Acute Rehabilitation Services Pager [phone] Office [phone]   Chancy MilroyChristie S Juquan Reznick 09/06/2018, 2:41 PM

## 2018-09-07 ENCOUNTER — Other Ambulatory Visit: Payer: Self-pay

## 2018-09-07 ENCOUNTER — Inpatient Hospital Stay (HOSPITAL_COMMUNITY): Payer: Medicare HMO

## 2018-09-07 ENCOUNTER — Encounter (HOSPITAL_COMMUNITY): Payer: Self-pay

## 2018-09-07 DIAGNOSIS — E876 Hypokalemia: Secondary | ICD-10-CM

## 2018-09-07 DIAGNOSIS — I639 Cerebral infarction, unspecified: Secondary | ICD-10-CM

## 2018-09-07 DIAGNOSIS — D72829 Elevated white blood cell count, unspecified: Secondary | ICD-10-CM

## 2018-09-07 DIAGNOSIS — I63511 Cerebral infarction due to unspecified occlusion or stenosis of right middle cerebral artery: Secondary | ICD-10-CM

## 2018-09-07 DIAGNOSIS — N3010 Interstitial cystitis (chronic) without hematuria: Secondary | ICD-10-CM

## 2018-09-07 DIAGNOSIS — I10 Essential (primary) hypertension: Secondary | ICD-10-CM

## 2018-09-07 LAB — BASIC METABOLIC PANEL
Anion gap: 7 (ref 5–15)
BUN: 18 mg/dL (ref 8–23)
CO2: 21 mmol/L — ABNORMAL LOW (ref 22–32)
Calcium: 8.7 mg/dL — ABNORMAL LOW (ref 8.9–10.3)
Chloride: 110 mmol/L (ref 98–111)
Creatinine, Ser: 1.08 mg/dL — ABNORMAL HIGH (ref 0.44–1.00)
GFR calc Af Amer: 52 mL/min — ABNORMAL LOW (ref >60)
GFR calc non Af Amer: 45 mL/min — ABNORMAL LOW (ref >60)
Glucose, Bld: 120 mg/dL — ABNORMAL HIGH (ref 70–99)
Potassium: 3.1 mmol/L — ABNORMAL LOW (ref 3.5–5.1)
Sodium: 138 mmol/L (ref 135–145)

## 2018-09-07 LAB — URINE CULTURE

## 2018-09-07 LAB — POCT I-STAT, CHEM 8
BUN: 25 mg/dL — ABNORMAL HIGH (ref 8–23)
Calcium, Ion: 1.13 mmol/L — ABNORMAL LOW (ref 1.15–1.40)
Chloride: 107 mmol/L (ref 98–111)
Creatinine, Ser: 1.4 mg/dL — ABNORMAL HIGH (ref 0.44–1.00)
Glucose, Bld: 127 mg/dL — ABNORMAL HIGH (ref 70–99)
HCT: 47 % — ABNORMAL HIGH (ref 36.0–46.0)
Hemoglobin: 16 g/dL — ABNORMAL HIGH (ref 12.0–15.0)
Potassium: 3 mmol/L — ABNORMAL LOW (ref 3.5–5.1)
Sodium: 139 mmol/L (ref 135–145)
TCO2: 21 mmol/L — ABNORMAL LOW (ref 22–32)

## 2018-09-07 LAB — POCT I-STAT TROPONIN I: Troponin i, poc: 0.01 ng/mL (ref 0.00–0.08)

## 2018-09-07 MED ORDER — PNEUMOCOCCAL VAC POLYVALENT 25 MCG/0.5ML IJ INJ
0.5000 mL | INJECTION | INTRAMUSCULAR | Status: AC
  Administered 2018-09-09: 0.5 mL via INTRAMUSCULAR
  Filled 2018-09-07: qty 0.5

## 2018-09-07 NOTE — Progress Notes (Signed)
Occupational Therapy Treatment Patient Details Name: Wendy Andrews MRN: [id number] DOB: 17-Jan-1930 Today's Date: 09/07/2018    History of present illness Wendy Andrews is a 84 y.o. female with medical history significant of HTN, HLD.  Patient has been having L sided weakness present since at least 11/15.  Exact time of onset unclear.  Patient was stumbling around at that time she says.  11/16, family hadnt heard from patient so they asked for a wellfare check.  EMS found patient on floor trying to get to door. Neurology impression: with moderate right MCA distribution infarct.   OT comments  Upon entering the room, pt looking to L to locate therapist and correctly identify shirt color. Bed mobility with supervision and min cuing for proper technique and hand placement. Pt seated on EOB to open containers and needing increased time to utilize B UEs to open and locate packages on tray table. Pt standing with mod lifting assistance and transferring into recliner chair with RW and min A. Pt needing increased time for processing and sequencing of movement and commands. Pt seated in recliner chair with breakfast and chair alarm activated for safety. Pt will continue to benefit from OT intervention to address functional deficits.   Follow Up Recommendations  CIR    Equipment Recommendations  Other (comment)(defer to next venue of care)    Recommendations for Other Services      Precautions / Restrictions Precautions Precautions: Fall Restrictions Weight Bearing Restrictions: No       Mobility Bed Mobility Overal bed mobility: Needs Assistance Bed Mobility: Supine to Sit     Supine to sit: Supervision;HOB elevated     General bed mobility comments: min verbal cuing for technique  Transfers Overall transfer level: Needs assistance Equipment used: Rolling walker (2 wheeled) Transfers: Sit to/from [employer] Sit to Stand: Mod assist Stand pivot transfers: Mod  assist       General transfer comment: mod lifting assistance to stand and transfers from bed >recliner chair with min A and use of RW. Increased time needed for sequencing of task.     Balance Overall balance assessment: Needs assistance Sitting-balance support: No upper extremity supported;Feet supported Sitting balance-Leahy Scale: Good Sitting balance - Comments: supervision for sitting balance   Standing balance support: Single extremity supported;During functional activity Standing balance-Leahy Scale: Fair Standing balance comment: reliant on B UE and external support        ADL either performed or assessed with clinical judgement   ADL Overall ADL's : Needs assistance/impaired Eating/Feeding: Set up;Supervision/ safety Eating/Feeding Details (indicate cue type and reason): cuing for technique and increased time to locate and open containers                  Cognition Arousal/Alertness: Awake/alert Behavior During Therapy: WFL for tasks assessed/performed Overall Cognitive Status: No family/caregiver present to determine baseline cognitive functioning Area of Impairment: Orientation                 Orientation Level: Disoriented to;Time Current Attention Level: Sustained Memory: Decreased recall of precautions;Decreased short-term memory Following Commands: Follows one step commands consistently Safety/Judgement: Decreased awareness of safety;Decreased awareness of deficits Awareness: Emergent Problem Solving: Slow processing;Decreased initiation;Requires verbal cues;Difficulty sequencing                     Pertinent Vitals/ Pain       Pain Assessment: No/denies pain         Frequency  Min 2X/week  Progress Toward Goals  OT Goals(current goals can now be found in the care plan section)  Progress towards OT goals: Progressing toward goals  Acute Rehab OT Goals Patient Stated Goal: to get stronger OT Goal Formulation: With  patient Time For Goal Achievement: 09/21/18 Potential to Achieve Goals: Good  Plan Discharge plan remains appropriate       AM-PAC PT "6 Clicks" Daily Activity     Outcome Measure   Help from another person eating meals?: A Little Help from another person taking care of personal grooming?: A Little Help from another person toileting, which includes using toliet, bedpan, or urinal?: A Lot Help from another person bathing (including washing, rinsing, drying)?: A Lot Help from another person to put on and taking off regular upper body clothing?: A Little Help from another person to put on and taking off regular lower body clothing?: A Lot 6 Click Score: 15    End of Session Equipment Utilized During Treatment: Rolling walker  OT Visit Diagnosis: Other abnormalities of gait and mobility (R26.89);Unsteadiness on feet (R26.81);Muscle weakness (generalized) (M62.81);Other symptoms and signs involving the nervous system (R29.898);Other symptoms and signs involving cognitive function;Hemiplegia and hemiparesis Hemiplegia - Right/Left: Left Hemiplegia - dominant/non-dominant: Non-Dominant Hemiplegia - caused by: Cerebral infarction   Activity Tolerance Patient tolerated treatment well   Patient Left in chair;with call bell/phone within reach;with chair alarm set   Nurse Communication          Time: 1610-9604: 0831-0842 OT Time Calculation (min): 11 min  Charges: OT General Charges $OT Visit: 1 Visit OT Treatments $Self Care/Home Management : 8-22 mins   Jacqui Headen P, MS, OTR/L 09/07/2018, 8:59 AM

## 2018-09-07 NOTE — Progress Notes (Signed)
Physical Therapy Treatment Patient Details Name: Wendy LeavellLillian B Lumbra MRN: [id number] DOB: 07/17/1930 Today's Date: 09/07/2018    History of Present Illness Wendy LeavellLillian B Tierce is a 84 y.o. female with medical history significant of HTN, HLD.  Patient has been having L sided weakness present since at least 11/15.  Exact time of onset unclear.  Patient was stumbling around at that time she says.  11/16, family hadnt heard from patient so they asked for a wellfare check.  EMS found patient on floor trying to get to door. Neurology impression: with moderate right MCA distribution infarct.    PT Comments    Continuing work on functional mobility and activity tolerance;  Session focused on progressive amb; noting decr L step length, corrects briefly with cues, extinguishes back to assymetrical step length within 20-30 seconds; RW tends to list Left; continue to recommend CIR for post-acute rehab; Gardiner RamusLillian tells me her nephew helps her -- but I'm not sure how much assist he can give her -- that may be a sticking point.  Follow Up Recommendations  CIR(for post-acute rehabiliatation)     Equipment Recommendations  Rolling walker with 5" wheels;3in1 (PT)    Recommendations for Other Services Rehab consult     Precautions / Restrictions Precautions Precautions: Fall Restrictions Weight Bearing Restrictions: No    Mobility  Bed Mobility Overal bed mobility: Needs Assistance Bed Mobility: Supine to Sit     Supine to sit: Supervision;HOB elevated     General bed mobility comments: min verbal cuing for technique  Transfers Overall transfer level: Needs assistance Equipment used: Rolling walker (2 wheeled) Transfers: Sit to/from Stand Sit to Stand: Mod assist Stand pivot transfers: Mod assist       General transfer comment: Mod assist to rise; cues for hand placement and safety; reinforced the need to have assistance when getting up  Ambulation/Gait Ambulation/Gait assistance: Mod  assist;+2 safety/equipment(chair push) Gait Distance (Feet): 40 Feet Assistive device: Rolling walker (2 wheeled) Gait Pattern/deviations: Decreased step length - left;Trunk flexed;Drifts right/left     General Gait Details: Tended to have RW too far in front, and with decr L step length; +2 for safety as she is at a very high fall risk; took a few standign rest breaks to reinforce RW position, posture   Stairs             Wheelchair Mobility    Modified Rankin (Stroke Patients Only) Modified Rankin (Stroke Patients Only) Pre-Morbid Rankin Score: No symptoms Modified Rankin: Moderately severe disability     Balance Overall balance assessment: Needs assistance Sitting-balance support: No upper extremity supported;Feet supported Sitting balance-Leahy Scale: Good Sitting balance - Comments: supervision for sitting balance   Standing balance support: Single extremity supported;During functional activity Standing balance-Leahy Scale: Fair Standing balance comment: reliant on B UE and external support                            Cognition Arousal/Alertness: Awake/alert Behavior During Therapy: WFL for tasks assessed/performed Overall Cognitive Status: No family/caregiver present to determine baseline cognitive functioning Area of Impairment: Orientation                 Orientation Level: Disoriented to;Time Current Attention Level: Sustained Memory: Decreased recall of precautions;Decreased short-term memory Following Commands: Follows one step commands consistently Safety/Judgement: Decreased awareness of safety;Decreased awareness of deficits Awareness: Emergent Problem Solving: Slow processing;Decreased initiation;Requires verbal cues;Difficulty sequencing        Exercises  General Comments        Pertinent Vitals/Pain Pain Assessment: No/denies pain    Home Living                      Prior Function            PT Goals  (current goals can now be found in the care plan section) Acute Rehab PT Goals Patient Stated Goal: to get stronger PT Goal Formulation: With patient Time For Goal Achievement: 09/20/18 Potential to Achieve Goals: Good Progress towards PT goals: Progressing toward goals    Frequency    Min 4X/week      PT Plan Current plan remains appropriate    Co-evaluation              AM-PAC PT "6 Clicks" Daily Activity  Outcome Measure  Difficulty turning over in bed (including adjusting bedclothes, sheets and blankets)?: A Little Difficulty moving from lying on back to sitting on the side of the bed? : A Little Difficulty sitting down on and standing up from a chair with arms (e.g., wheelchair, bedside commode, etc,.)?: Unable Help needed moving to and from a bed to chair (including a wheelchair)?: A Little Help needed walking in hospital room?: A Lot Help needed climbing 3-5 steps with a railing? : Total 6 Click Score: 13    End of Session Equipment Utilized During Treatment: Gait belt Activity Tolerance: Patient tolerated treatment well Patient left: in chair;with call bell/phone within reach;with chair alarm set Nurse Communication: Mobility status PT Visit Diagnosis: Unsteadiness on feet (R26.81);Other abnormalities of gait and mobility (R26.89);Hemiplegia and hemiparesis Hemiplegia - Right/Left: Left Hemiplegia - dominant/non-dominant: Non-dominant Hemiplegia - caused by: Cerebral infarction     Time: 5366-4403 PT Time Calculation (min) (ACUTE ONLY): 20 min  Charges:  $Gait Training: 8-22 mins                     Van Clines, PT  Acute Rehabilitation Services Pager [phone] Office [phone]    Levi Aland 09/07/2018, 11:29 AM

## 2018-09-07 NOTE — Progress Notes (Signed)
LE venous duplex prelim: negative for DVT. Desiree Fleming Eunice, RDMS, RVT  

## 2018-09-07 NOTE — Consult Note (Signed)
Physical Medicine and Rehabilitation Consult   Reason for Consult: Functional deficits due to stroke Referring Physician: Dr. Catha Gosselin    HPI: Wendy Andrews is a 84 y.o. female with history of HTN, interstitial cystitis who was admitted on 09/05/2018 with one day history of left sided weakness with difficulty walking and found down by family.  History taken from chart review and patient.  CT perfusion positive for LVO R-MCA posterior M2 with penumbra corresponding to cytotoxic edema and extensive intracranial atherosclerosis with moderate stenosis dominant R-VA, R-ACA pericallosal artery and BA and severe L-PCOM origin.  MRI brain reviewed, showing right MCA infarct.  Per report,moderate acute R-MCA infarct. 2D echo showed eF 60-65% with moderately calcified mitral annulus and lipomatous hypertrophy atrial septum. Dr. Roda Shutters felt that stroke was embolic of unknown source and TEE/Loop recorder recommended for work up. Due to large vessel disease--to continue ASA 325 mg/Plavix X 3 months followed by ASA alone. Patient with resultant left sided weakness with left simultagnosia affecting mobility and ADLs . CIR recommended for follow up therapy.   Review of Systems  Constitutional: Negative for chills and fever.  HENT: Negative for hearing loss.   Eyes: Negative for blurred vision and double vision.  Respiratory: Negative for cough and shortness of breath.   Cardiovascular: Negative for chest pain and palpitations.  Gastrointestinal: Negative for heartburn and nausea.  Genitourinary: Negative for dysuria, frequency and urgency.  Musculoskeletal: Negative for joint pain.  Neurological: Positive for focal weakness (Patient states she feels weak on her right side) and weakness. Negative for dizziness and headaches.  Psychiatric/Behavioral: Negative for depression. The patient is not nervous/anxious.   All other systems reviewed and are negative.    Past Medical History:  Diagnosis Date  .  Hyperlipidemia   . Hypertension   . Insomnia   . Interstitial cystitis     Past Surgical History:  Procedure Laterality Date  . ABDOMINAL HYSTERECTOMY    . CHOLECYSTECTOMY N/A 12/10/2013   Procedure: LAPAROSCOPIC CHOLECYSTECTOMY WITH INTRAOPERATIVE CHOLANGIOGRAM;  Surgeon: Kandis Cocking, MD;  Location: [facility];  Service: General;  Laterality: N/A;    Family History  Problem Relation Age of Onset  . Heart failure Mother   . Heart failure Father   . Cancer Sister   . Cancer Brother        lung  . Cancer Brother        brain    Social History:  Lives alone. Independent PTA. Has a nephew in town and neighbour who check in on her. She  reports that she has never smoked but she worked for [employer] for 40 years. She has never used smokeless tobacco. She reports that she does not drink alcohol or use drugs.    Allergies  Allergen Reactions  . Tetanus Toxoids     unknown  . Penicillins Hives and Rash    Medications Prior to Admission  Medication Sig Dispense Refill  . amLODipine (NORVASC) 10 MG tablet Take 10 mg by mouth daily.    Marland Kitchen estrogens, conjugated, (PREMARIN) 0.625 MG tablet Take 1.25 mg by mouth daily.    Marland Kitchen levothyroxine (SYNTHROID, LEVOTHROID) 75 MCG tablet Take 75 mcg by mouth daily.    Marland Kitchen lisinopril (PRINIVIL,ZESTRIL) 10 MG tablet Take 10 mg by mouth daily.    . traZODone (DESYREL) 50 MG tablet Take 50 mg by mouth at bedtime.      Home: Home Living Family/patient expects to be discharged to:: Private residence Living Arrangements:  Alone Available Help at Discharge: Family Type of Home: House Home Access: Stairs to enter Secretary/administrator of Steps: 3 Entrance Stairs-Rails: Right, Left Home Layout: One level Bathroom Shower/Tub: Health visitor: Standard Home Equipment: Medical laboratory scientific officer - quad, Information systems manager  Lives With: Alone  Functional History: Prior Function Level of Independence: Independent with assistive device(s) Comments: reports independent  and driving, limited iADLs (unsure of accuracy of history ) Functional Status:  Mobility: Bed Mobility Overal bed mobility: Needs Assistance Bed Mobility: Supine to Sit Supine to sit: Mod assist, HOB elevated General bed mobility comments: towards R side, mod assist for trunk support/forward scoot, stability and balance Transfers Overall transfer level: Needs assistance Equipment used: Rolling walker (2 wheeled), None Transfers: Sit to/from Stand, [employer] Transfers Sit to Stand: Mod assist, Max assist, +2 physical assistance, +2 safety/equipment Stand pivot transfers: Mod assist, Max assist, +2 safety/equipment, +2 physical assistance General transfer comment: pt level of assist with transfers fluctuating, requires mod assist +2  to power up into standing but increases to max assist +2 for balance and stability to maintain erect and midline posture, and stability  Ambulation/Gait Ambulation/Gait assistance: +2 physical assistance, Mod assist Gait Distance (Feet): 3 Feet(BSC to reclienr) Assistive device: Rolling walker (2 wheeled) Gait Pattern/deviations: Decreased step length - left General Gait Details: Tended to have RW too far in front, and with decr L step length; +2 for safety as she is at a very high fall risk    ADL: ADL Overall ADL's : Needs assistance/impaired Grooming: Minimal assistance, Sitting Upper Body Bathing: Minimal assistance, Sitting Lower Body Bathing: Maximal assistance, +2 for physical assistance, +2 for safety/equipment, Sit to/from stand Upper Body Dressing : Sitting, Moderate assistance Lower Body Dressing: Maximal assistance, +2 for physical assistance, Sit to/from stand Toilet Transfer: Moderate assistance, Maximal assistance, +2 for physical assistance, +2 for safety/equipment, BSC, Stand-pivot Toilet Transfer Details (indicate cue type and reason): fluctuating mod-max assist for safety, balance and physical support Toileting- Clothing Manipulation  and Hygiene: Moderate assistance, +2 for physical assistance, +2 for safety/equipment, Sit to/from stand, Sitting/lateral lean Functional mobility during ADLs: Maximal assistance, Moderate assistance, +2 for physical assistance, Cueing for safety, Cueing for sequencing General ADL Comments: Patient limited by cognition, safety awareness, impaired balance, L neglect/inattention, and L sided weakness.   Cognition: Cognition Overall Cognitive Status: No family/caregiver present to determine baseline cognitive functioning Arousal/Alertness: Awake/alert Orientation Level: Oriented to person, Oriented to place, Oriented to situation, Disoriented to time Attention: Focused Focused Attention: Impaired Focused Attention Impairment: Verbal basic Alternating Attention: Impaired Alternating Attention Impairment: Verbal basic Memory: Impaired Memory Impairment: Storage deficit, Decreased recall of new information Awareness: Impaired Awareness Impairment: Anticipatory impairment Problem Solving: Impaired Problem Solving Impairment: Functional basic Reasoning: Impaired Reasoning Impairment: Verbal complex Safety/Judgment: Impaired Cognition Arousal/Alertness: Awake/alert Behavior During Therapy: Impulsive Overall Cognitive Status: No family/caregiver present to determine baseline cognitive functioning Area of Impairment: Attention, Memory, Following commands, Safety/judgement, Awareness, Problem solving Current Attention Level: Sustained Memory: Decreased recall of precautions, Decreased short-term memory Following Commands: Follows one step commands inconsistently, Follows one step commands with increased time Safety/Judgement: Decreased awareness of safety, Decreased awareness of deficits Awareness: Emergent Problem Solving: Slow processing, Decreased initiation, Requires verbal cues, Difficulty sequencing, Requires tactile cues   Blood pressure (!) 200/90, pulse 88, temperature 98.3 F (36.8  C), temperature source Oral, resp. rate 20, height 5\' 4"  (1.626 m), weight 86.1 kg, last menstrual period 10/22/1959, SpO2 99 %. Physical Exam  Nursing note and vitals reviewed. Constitutional: She is  oriented to person, place, and time. She appears well-developed.  Strong odor of urine in the room.  Obese  HENT:  Head: Normocephalic and atraumatic.  Eyes: EOM are normal. Right eye exhibits no discharge. Left eye exhibits no discharge.  Neck: Normal range of motion. Neck supple.  Cardiovascular: Normal rate and regular rhythm.  Respiratory: Effort normal and breath sounds normal.  GI: Soft. Bowel sounds are normal.  Musculoskeletal:  No edema or tenderness in extremities  Neurological: She is alert and oriented to person, place, and time.  Mild left facial weakness but speech clear.  Very HOH Mild left inattention with sensory deficits LLE.  Able to follow basic commands without difficulty.  Motor: Right upper extremity: 4-/5 proximal distal Left upper extremity: 4/5 proximal distal Right lower extremity: Hip flexion, knee extension to -10/5, ankle dorsiflexion 2/5 Left lower extremity: Hip flexion, knee extension 2/5, ankle dorsiflexion 4/5  Skin: Skin is warm and dry.  Psychiatric: She has a normal mood and affect. Her behavior is normal.    No results found for this or any previous visit (from the past 24 hour(s)). Ct Angio Head W Or Wo Contrast  Result Date: 09/05/2018 CLINICAL DATA:  84 year old female code stroke presentation with CT changes of acute right MCA territory infarct. EXAM: CT ANGIOGRAPHY HEAD AND NECK CT PERFUSION BRAIN TECHNIQUE: Multidetector CT imaging of the head and neck was performed using the standard protocol during bolus administration of intravenous contrast. Multiplanar CT image reconstructions and MIPs were obtained to evaluate the vascular anatomy. Carotid stenosis measurements (when applicable) are obtained utilizing NASCET criteria, using the distal  internal carotid diameter as the denominator. Multiphase CT imaging of the brain was performed following IV bolus contrast injection. Subsequent parametric perfusion maps were calculated using RAPID software. CONTRAST:  ISOVUE-370 IOPAMIDOL (ISOVUE-370) INJECTION 76% COMPARISON:  Head CT without contrast 2044 hours. FINDINGS: CT Brain Perfusion Findings: ASPECTS 7 versus 8 on the plain head CT today. CBF (<30%) Volume: 0 (erroneous). Perfusion (Tmax>6.0s) volume: 19mL, much of which seems to correspond to the aspects changes on plain CT. Mismatch Volume: 19mL, but thought to be partially erroneous. Infarction Location:Posterior right MCA territory. CTA NECK Skeleton: Absent maxillary dentition and degenerative changes in the spine. No acute osseous abnormality identified. Upper chest: Gas trapping and/or atelectasis scattered in the upper lungs. No superior mediastinal lymphadenopathy. Other neck: Incidental venous contrast reflux into prominent lower internal jugular veins. Otherwise negative. Aortic arch: 3 vessel arch configuration. Moderate arch atherosclerosis although primarily distal to the great vessel origins. But furthermore, some of that plaque is ulcerated as seen on series 7, image 157. Right carotid system: Atherosclerosis without stenosis. Left carotid system: Soft plaque in the ventral left CCA without stenosis. Soft and calcified plaque at the left ICA origin and bulb resulting in less than 50 % stenosis with respect to the distal vessel. Vertebral arteries: Right subclavian artery origin plaque resulting in 50% stenosis. Normal right vertebral artery origin. Dominant right vertebral artery with intermittent atherosclerosis but no hemodynamically significant stenosis to the skull base. No proximal left subclavian artery stenosis despite plaque. Non dominant left vertebral artery. Mild to moderate V1 segment stenosis. The left vertebral remains small but patent to the skull base. CTA HEAD  Posterior circulation: The left vertebral functionally terminates in PICA. Moderate right V4 segment stenosis occurs due to calcified plaque near the right PICA origin which remains patent. Patent vertebrobasilar junction. Moderately irregular and diminutive basilar artery with moderate stenosis just proximal to the  SCA origins. Fetal type left PCA origin with severe left posterior communicating artery stenosis (series 10, image 36). Widespread mild irregularity and stenosis of the right PCA branches. Anterior circulation: Both ICA siphons are patent. Abundant calcified plaque bilaterally. Mild bilateral supraclinoid siphon stenosis results. Normal ophthalmic and left posterior communicating artery origins. Patent carotid termini. Dominant right ACA. The diminutive left A1 segment is stenotic. The right ACA remains dominant throughout. There is moderate stenosis of the right pericallosal artery (series 10, image 34). Left MCA M1 segment is patent without stenosis. Left MCA bifurcation is patent. No left MCA branch occlusion identified. There is widespread distal left MCA branch irregularity. Right MCA M1 segment and bifurcation are patent. There is a right MCA posterior division M2 occlusion just beyond the bifurcation (series 9, image 124). Venous sinuses: Patent. Anatomic variants: Dominant right vertebral artery, the left functionally terminates in PICA. Fetal type left PCA origin. Review of the MIP images confirms the above findings IMPRESSION: 1. Positive for LVO: Right MCA posterior M2. 2. However, CTP does not detect the known posterior right MCA core infarct (ASPECTS 7 versus 8 today), and the 19 mL of CTP penumbra seems to largely correspond to the cytotoxic edema. 3. The above was discussed by telephone with Dr. Ritta Slot on 09/05/2018 at 2102 hours. 4. Additionally, there is extensive intracranial atherosclerosis with significant stenoses: - dominant Right Vertebral Artery V4 segment (moderate).  - Basilar Artery (moderate). - Left Pcomm, fetal left PCA origin (severe). - dominant Right ACA pericallosal artery (moderate). 5. Aortic Atherosclerosis (ICD10-I70.0) with some ulcerated plaque in the distal arch. Electronically Signed   By: Odessa Fleming M.D.   On: 09/05/2018 21:41   Ct Angio Neck W Or Wo Contrast  Result Date: 09/05/2018 CLINICAL DATA:  84 year old female code stroke presentation with CT changes of acute right MCA territory infarct. EXAM: CT ANGIOGRAPHY HEAD AND NECK CT PERFUSION BRAIN TECHNIQUE: Multidetector CT imaging of the head and neck was performed using the standard protocol during bolus administration of intravenous contrast. Multiplanar CT image reconstructions and MIPs were obtained to evaluate the vascular anatomy. Carotid stenosis measurements (when applicable) are obtained utilizing NASCET criteria, using the distal internal carotid diameter as the denominator. Multiphase CT imaging of the brain was performed following IV bolus contrast injection. Subsequent parametric perfusion maps were calculated using RAPID software. CONTRAST:  ISOVUE-370 IOPAMIDOL (ISOVUE-370) INJECTION 76% COMPARISON:  Head CT without contrast 2044 hours. FINDINGS: CT Brain Perfusion Findings: ASPECTS 7 versus 8 on the plain head CT today. CBF (<30%) Volume: 0 (erroneous). Perfusion (Tmax>6.0s) volume: 19mL, much of which seems to correspond to the aspects changes on plain CT. Mismatch Volume: 19mL, but thought to be partially erroneous. Infarction Location:Posterior right MCA territory. CTA NECK Skeleton: Absent maxillary dentition and degenerative changes in the spine. No acute osseous abnormality identified. Upper chest: Gas trapping and/or atelectasis scattered in the upper lungs. No superior mediastinal lymphadenopathy. Other neck: Incidental venous contrast reflux into prominent lower internal jugular veins. Otherwise negative. Aortic arch: 3 vessel arch configuration. Moderate arch atherosclerosis  although primarily distal to the great vessel origins. But furthermore, some of that plaque is ulcerated as seen on series 7, image 157. Right carotid system: Atherosclerosis without stenosis. Left carotid system: Soft plaque in the ventral left CCA without stenosis. Soft and calcified plaque at the left ICA origin and bulb resulting in less than 50 % stenosis with respect to the distal vessel. Vertebral arteries: Right subclavian artery origin plaque resulting in 50%  stenosis. Normal right vertebral artery origin. Dominant right vertebral artery with intermittent atherosclerosis but no hemodynamically significant stenosis to the skull base. No proximal left subclavian artery stenosis despite plaque. Non dominant left vertebral artery. Mild to moderate V1 segment stenosis. The left vertebral remains small but patent to the skull base. CTA HEAD Posterior circulation: The left vertebral functionally terminates in PICA. Moderate right V4 segment stenosis occurs due to calcified plaque near the right PICA origin which remains patent. Patent vertebrobasilar junction. Moderately irregular and diminutive basilar artery with moderate stenosis just proximal to the SCA origins. Fetal type left PCA origin with severe left posterior communicating artery stenosis (series 10, image 36). Widespread mild irregularity and stenosis of the right PCA branches. Anterior circulation: Both ICA siphons are patent. Abundant calcified plaque bilaterally. Mild bilateral supraclinoid siphon stenosis results. Normal ophthalmic and left posterior communicating artery origins. Patent carotid termini. Dominant right ACA. The diminutive left A1 segment is stenotic. The right ACA remains dominant throughout. There is moderate stenosis of the right pericallosal artery (series 10, image 34). Left MCA M1 segment is patent without stenosis. Left MCA bifurcation is patent. No left MCA branch occlusion identified. There is widespread distal left MCA  branch irregularity. Right MCA M1 segment and bifurcation are patent. There is a right MCA posterior division M2 occlusion just beyond the bifurcation (series 9, image 124). Venous sinuses: Patent. Anatomic variants: Dominant right vertebral artery, the left functionally terminates in PICA. Fetal type left PCA origin. Review of the MIP images confirms the above findings IMPRESSION: 1. Positive for LVO: Right MCA posterior M2. 2. However, CTP does not detect the known posterior right MCA core infarct (ASPECTS 7 versus 8 today), and the 19 mL of CTP penumbra seems to largely correspond to the cytotoxic edema. 3. The above was discussed by telephone with Dr. Ritta Slot on 09/05/2018 at 2102 hours. 4. Additionally, there is extensive intracranial atherosclerosis with significant stenoses: - dominant Right Vertebral Artery V4 segment (moderate). - Basilar Artery (moderate). - Left Pcomm, fetal left PCA origin (severe). - dominant Right ACA pericallosal artery (moderate). 5. Aortic Atherosclerosis (ICD10-I70.0) with some ulcerated plaque in the distal arch. Electronically Signed   By: Odessa Fleming M.D.   On: 09/05/2018 21:41   Mr Brain Wo Contrast  Result Date: 09/06/2018 CLINICAL DATA:  Left-sided weakness.  Right MCA infarct. EXAM: MRI HEAD WITHOUT CONTRAST TECHNIQUE: Multiplanar, multiecho pulse sequences of the brain and surrounding structures were obtained without intravenous contrast. COMPARISON:  Head CT, CTA, and CT perfusion 09/05/2018. Brain MRI 05/25/2010. FINDINGS: Multiple sequences are moderately motion degraded despite using faster, more motion resistant imaging protocols. Brain: There is an acute posterior right MCA territory infarct with patchy cortical and white matter restricted diffusion greatest in the parietal lobe. The infarction extends into the posterior right corona radiata, posterior right temporal lobe, and insula and subinsular white matter. There is mild cytotoxic edema without mass  effect or hemorrhage. Patchy to confluent T2 hyperintensities elsewhere in the cerebral white matter bilaterally are nonspecific but compatible with extensive chronic small vessel ischemic disease. Chronic lacunar infarcts are present in the basal ganglia bilaterally. Cerebral atrophy is not greater than expected for age. No mass, midline shift, or extra-axial fluid collection is seen. Vascular: FLAIR hyperintensity anteriorly in the right sylvian fissure corresponding to M2 occlusion on CTA. Hypoplastic left vertebral artery. Skull and upper cervical spine: Unremarkable bone marrow signal. Sinuses/Orbits: Bilateral cataract extraction. Trace right mastoid effusion. Clear paranasal sinuses. Other: None. IMPRESSION: 1.  Moderate-sized acute posterior right MCA infarct.  No hemorrhage. 2. Extensive chronic small vessel ischemic disease. Electronically Signed   By: Sebastian AcheAllen  Grady M.D.   On: 09/06/2018 09:02   Ct Cerebral Perfusion W Contrast  Result Date: 09/05/2018 CLINICAL DATA:  84 year old female code stroke presentation with CT changes of acute right MCA territory infarct. EXAM: CT ANGIOGRAPHY HEAD AND NECK CT PERFUSION BRAIN TECHNIQUE: Multidetector CT imaging of the head and neck was performed using the standard protocol during bolus administration of intravenous contrast. Multiplanar CT image reconstructions and MIPs were obtained to evaluate the vascular anatomy. Carotid stenosis measurements (when applicable) are obtained utilizing NASCET criteria, using the distal internal carotid diameter as the denominator. Multiphase CT imaging of the brain was performed following IV bolus contrast injection. Subsequent parametric perfusion maps were calculated using RAPID software. CONTRAST:  125mL ISOVUE-370 IOPAMIDOL (ISOVUE-370) INJECTION 76% COMPARISON:  Head CT without contrast 2044 hours. FINDINGS: CT Brain Perfusion Findings: ASPECTS 7 versus 8 on the plain head CT today. CBF (<30%) Volume: 0 (erroneous).  Perfusion (Tmax>6.0s) volume: 19mL, much of which seems to correspond to the aspects changes on plain CT. Mismatch Volume: 19mL, but thought to be partially erroneous. Infarction Location:Posterior right MCA territory. CTA NECK Skeleton: Absent maxillary dentition and degenerative changes in the spine. No acute osseous abnormality identified. Upper chest: Gas trapping and/or atelectasis scattered in the upper lungs. No superior mediastinal lymphadenopathy. Other neck: Incidental venous contrast reflux into prominent lower internal jugular veins. Otherwise negative. Aortic arch: 3 vessel arch configuration. Moderate arch atherosclerosis although primarily distal to the great vessel origins. But furthermore, some of that plaque is ulcerated as seen on series 7, image 157. Right carotid system: Atherosclerosis without stenosis. Left carotid system: Soft plaque in the ventral left CCA without stenosis. Soft and calcified plaque at the left ICA origin and bulb resulting in less than 50 % stenosis with respect to the distal vessel. Vertebral arteries: Right subclavian artery origin plaque resulting in 50% stenosis. Normal right vertebral artery origin. Dominant right vertebral artery with intermittent atherosclerosis but no hemodynamically significant stenosis to the skull base. No proximal left subclavian artery stenosis despite plaque. Non dominant left vertebral artery. Mild to moderate V1 segment stenosis. The left vertebral remains small but patent to the skull base. CTA HEAD Posterior circulation: The left vertebral functionally terminates in PICA. Moderate right V4 segment stenosis occurs due to calcified plaque near the right PICA origin which remains patent. Patent vertebrobasilar junction. Moderately irregular and diminutive basilar artery with moderate stenosis just proximal to the SCA origins. Fetal type left PCA origin with severe left posterior communicating artery stenosis (series 10, image 36). Widespread  mild irregularity and stenosis of the right PCA branches. Anterior circulation: Both ICA siphons are patent. Abundant calcified plaque bilaterally. Mild bilateral supraclinoid siphon stenosis results. Normal ophthalmic and left posterior communicating artery origins. Patent carotid termini. Dominant right ACA. The diminutive left A1 segment is stenotic. The right ACA remains dominant throughout. There is moderate stenosis of the right pericallosal artery (series 10, image 34). Left MCA M1 segment is patent without stenosis. Left MCA bifurcation is patent. No left MCA branch occlusion identified. There is widespread distal left MCA branch irregularity. Right MCA M1 segment and bifurcation are patent. There is a right MCA posterior division M2 occlusion just beyond the bifurcation (series 9, image 124). Venous sinuses: Patent. Anatomic variants: Dominant right vertebral artery, the left functionally terminates in PICA. Fetal type left PCA origin. Review of the MIP images  confirms the above findings IMPRESSION: 1. Positive for LVO: Right MCA posterior M2. 2. However, CTP does not detect the known posterior right MCA core infarct (ASPECTS 7 versus 8 today), and the 19 mL of CTP penumbra seems to largely correspond to the cytotoxic edema. 3. The above was discussed by telephone with Dr. Ritta Slot on 09/05/2018 at 2102 hours. 4. Additionally, there is extensive intracranial atherosclerosis with significant stenoses: - dominant Right Vertebral Artery V4 segment (moderate). - Basilar Artery (moderate). - Left Pcomm, fetal left PCA origin (severe). - dominant Right ACA pericallosal artery (moderate). 5. Aortic Atherosclerosis (ICD10-I70.0) with some ulcerated plaque in the distal arch. Electronically Signed   By: Odessa Fleming M.D.   On: 09/05/2018 21:41   Dg Chest Port 1 View  Result Date: 09/06/2018 CLINICAL DATA:  Stroke EXAM: PORTABLE CHEST 1 VIEW COMPARISON:  11/18/2017 FINDINGS: There are low lung volumes.  There is no focal consolidation. There are prominent interstitial markings at the lung bases likely secondary to low lung volumes. There is no pleural effusion or pneumothorax. The heart and mediastinal contours are unremarkable. 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 There is moderate osteoarthritis of the right glenohumeral joint. IMPRESSION: No active disease. Electronically Signed   By: Elige Ko   On: 09/06/2018 00:10   Ct Head Code Stroke Wo Contrast  Result Date: 09/05/2018 CLINICAL DATA:  Code stroke. 84 year old female with left side weakness. EXAM: CT HEAD WITHOUT CONTRAST TECHNIQUE: Contiguous axial images were obtained from the base of the skull through the vertex without intravenous contrast. COMPARISON:  03/07/2018 head CT. FINDINGS: Brain: Cytotoxic edema in the posterior right MCA territory on series 8, image 37. Chronic but progressed bilateral basal ganglia and external capsule white matter hypodensity since May (series 8, image 16. Other confluent bilateral cerebral white matter hypodensity appears stable. No acute intracranial hemorrhage identified. No midline shift, mass effect, or evidence of intracranial mass lesion. No ventriculomegaly. Vascular: Calcified atherosclerosis at the skull base. No suspicious intracranial vascular hyperdensity. Skull: Negative. Sinuses/Orbits: Paranasal sinuses and mastoids are clear today. Other: No acute orbit or scalp soft tissue finding. ASPECTS [facility] Stroke Program Early CT Score) - Ganglionic level infarction (caudate, lentiform nuclei, internal capsule, insula, M1-M3 cortex): 5 (abnormal right lentiform and M2 segment) - Supraganglionic infarction (M4-M6 cortex): 2 (abnormal right M5 segment). Total score (0-10 with 10 being normal): 7 IMPRESSION: 1. Evidence of acute posterior right MCA territory infarct. No hemorrhage or mass effect. 2. ASPECTS is 7. 3. Other bilateral progressed chronic small vessel disease since May  2019. 4. These results were communicated to Dr. Amada Jupiter at 8:58 pmon 11/16/2019by text page via the [facility] messaging system. Electronically Signed   By: Odessa Fleming M.D.   On: 09/05/2018 20:58    Assessment/Plan: Diagnosis: Acute R-MCA infarct Labs and images (see above) independently reviewed.  Records reviewed and summated above. Stroke: Continue secondary stroke prophylaxis and Risk Factor Modification listed below:   Blood Pressure Management:  Continue current medication with prn's with permisive HTN per primary team Statin Agent:   Left sided hemiparesis: fit for orthosis to prevent contractures (resting hand splint for day, wrist cock up splint at night, PRAFO, etc) PT/OT for mobility, ADL training  Motor recovery: Fluoxetine  1. Does the need for close, 24 hr/day medical supervision in concert with the patient's rehab needs make it unreasonable for this patient to be served in a less intensive setting? Yes  2. Co-Morbidities requiring supervision/potential complications: HTN (monitor and  provide prns in accordance with increased physical exertion and pain), interstitial cystitis, hypokalemia (continue to monitor and replete as necessary), leukocytosis (repeat labs, cont to monitor for signs and symptoms of infection, further workup if indicated) 3. Due to safety, skin/wound care, disease management and patient education, does the patient require 24 hr/day rehab nursing? Yes 4. Does the patient require coordinated care of a physician, rehab nurse, PT (1-2 hrs/day, 5 days/week) and OT (1-2 hrs/day, 5 days/week) to address physical and functional deficits in the context of the above medical diagnosis(es)? Yes Addressing deficits in the following areas: balance, endurance, locomotion, strength, transferring, bathing, dressing, toileting and psychosocial support 5. Can the patient actively participate in an intensive therapy program of at least 3 hrs of therapy per day at least 5 days per week?  Yes 6. The potential for patient to make measurable gains while on inpatient rehab is excellent 7. Anticipated functional outcomes upon discharge from inpatient rehab are supervision and min assist  with PT, supervision and min assist with OT, n/a with SLP. 8. Estimated rehab length of stay to reach the above functional goals is: 17-20 days. 9. Anticipated D/C setting: Other 10. Anticipated post D/C treatments: SNF. 11. Overall Rehab/Functional Prognosis: good  RECOMMENDATIONS: This patient's condition is appropriate for continued rehabilitative care in the following setting: CIR if adequate caregiver support available upon discharge after completion of medical work-up. Patient has agreed to participate in recommended program. Potentially Note that insurance prior authorization may be required for reimbursement for recommended care.  Comment: Rehab Admissions Coordinator to follow up.   I have personally performed a face to face diagnostic evaluation, including, but not limited to relevant history and physical exam findings, of this patient and developed relevant assessment and plan.  Additionally, I have reviewed and concur with the physician assistant's documentation above.   Maryla Morrow, MD, ABPMR Jacquelynn Cree, PA-C 09/07/2018

## 2018-09-07 NOTE — Progress Notes (Signed)
PROGRESS NOTE    Wendy Andrews  ZOX:[id number] DOB: 15-Aug-1930 DOA: 09/05/2018 PCP: Burton Apley, MD   Brief Narrative:  HPI on 09/05/2018 by Dr. Lyda Perone Wendy Andrews is a 84 y.o. female with medical history significant of HTN, HLD.  Patient has been having L sided weakness present since at least yesterday.  Exact time of onset unclear.  Patient was stumbling around yesterday she says.  Today family hadnt heard from patient so they asked for a wellfare check.  EMS found patient on floor trying to get to door.  Interim history Admitted for acute CVA. Neurology consulted. Pending workup- TEE/loop. CIR? Assessment & Plan   Acute CVA -Patient presented with left-sided weakness -Head showed acute posterior right MCA territory infarct.  No hemorrhage or mass-effect. -CTA head shows positive for LVO: Right MCA posterior M2. -MRI brain: Shows moderate sized acute posterior right MCA infarct.  No hemorrhage. -LDL 165, hemoglobin A1c 5.3 -Echocardiogram EF 60-65% -Neurology consulted and appreciated, recommended TEE/loop recorder, Aspirin 325mg  + Plavix 75mg  for 3 months, followed by aspirin alone for intracranial stenosis -lower extremity doppler negative for DVT -Placed on statin, aspirin, plavix  -PT/OT recommending inpatient rehab -CIR consulted and patient potentially a candidate  -Cardiology consulted for TEE/loop  Asymptomatic bacteriuria  -UA rare bacteria, 21-50 WBC, negative nitrites, small leukocytes -Urine culture pending -With mild leukocytosis, 13.2.  This may be reactive to her CVA  -Antibiotics currently held for now as patient does not have any complaints.  She is currently afebrile  Essential hypertension -Home medications held, allowing for permissive hypertension  Hyperlipidemia -Continue statin  Hypothyroidism -will restart synthroid   DVT Prophylaxis  lovenox  Code Status: Full  Family Communication: none at bedside  Disposition Plan:  Admitted, pending workup. Dispo CIR?  Consultants Neurology Inpatient rehab  Cardiology   Procedures  Echocardiogram Lower extremity doppler  Antibiotics   Anti-infectives (From admission, onward)   None      Subjective:   Wendy Andrews seen and examined today.  Denies current chest pain, shortness of breath, abdominal pain, N/V/D/C.  Objective:   Vitals:   09/07/18 0016 09/07/18 0401 09/07/18 0801 09/07/18 1143  BP: 117/84 137/67 (!) 200/90 (!) 186/81  Pulse: (!) 101 89 88 65  Resp: 18 18 20 18   Temp: 98 F (36.7 C) 98.2 F (36.8 C) 98.3 F (36.8 C) 97.7 F (36.5 C)  TempSrc: Oral Oral Oral Oral  SpO2: 94% 97% 99% 99%  Weight:      Height:        Intake/Output Summary (Last 24 hours) at 09/07/2018 1317 Last data filed at 09/07/2018 0553 Gross per 24 hour  Intake 240 ml  Output -  Net 240 ml   Filed Weights   09/05/18 2116  Weight: 86.1 kg   Exam  General: Well developed, well nourished, NAD, appears stated age  HEENT: NCAT, mucous membranes moist.   Neck: Supple  Cardiovascular: S1 S2 auscultated, RRR  Respiratory: Clear to auscultation bilaterally with equal chest rise  Abdomen: Soft, nontender, nondistended, + bowel sounds  Extremities: warm dry without cyanosis clubbing or edema  Neuro: AAOx3, left sided weakness  Psych: pleasant, appropriate mood and affect   Data Reviewed: I have personally reviewed following labs and imaging studies  CBC: Recent Labs  Lab 09/05/18 2038 09/05/18 2044  WBC 13.2*  --   NEUTROABS 10.4*  --   HGB 14.6 16.0*  HCT 46.3* 47.0*  MCV 80.2  --   PLT 274  --  Basic Metabolic Panel: Recent Labs  Lab 09/05/18 2038 09/05/18 2044 09/07/18 0805  NA 134* 139 138  K 3.1* 3.0* 3.1*  CL 103 107 110  CO2 19*  --  21*  GLUCOSE 129* 127* 120*  BUN 23 25* 18  CREATININE 1.51* 1.40* 1.08*  CALCIUM 9.1  --  8.7*   GFR: Estimated Creatinine Clearance: 39.7 mL/min (A) (by C-G formula based on SCr of  1.08 mg/dL (H)). Liver Function Tests: Recent Labs  Lab 09/05/18 2038  AST 32  ALT 19  ALKPHOS 84  BILITOT 1.1  PROT 7.0  ALBUMIN 4.0   No results for input(s): LIPASE, AMYLASE in the last 168 hours. No results for input(s): AMMONIA in the last 168 hours. Coagulation Profile: Recent Labs  Lab 09/05/18 2038  INR 1.06   Cardiac Enzymes: Recent Labs  Lab 09/06/18 0443  CKTOTAL 316*   BNP (last 3 results) No results for input(s): PROBNP in the last 8760 hours. HbA1C: Recent Labs    09/06/18 0443  HGBA1C 5.3   CBG: No results for input(s): GLUCAP in the last 168 hours. Lipid Profile: Recent Labs    09/06/18 0443  CHOL 227*  HDL 45  LDLCALC 165*  TRIG 86  CHOLHDL 5.0   Thyroid Function Tests: No results for input(s): TSH, T4TOTAL, FREET4, T3FREE, THYROIDAB in the last 72 hours. Anemia Panel: No results for input(s): VITAMINB12, FOLATE, FERRITIN, TIBC, IRON, RETICCTPCT in the last 72 hours. Urine analysis:    Component Value Date/Time   COLORURINE YELLOW 09/05/2018 2229   APPEARANCEUR HAZY (A) 09/05/2018 2229   LABSPEC >1.046 (H) 09/05/2018 2229   PHURINE 5.0 09/05/2018 2229   GLUCOSEU NEGATIVE 09/05/2018 2229   HGBUR SMALL (A) 09/05/2018 2229   BILIRUBINUR NEGATIVE 09/05/2018 2229   KETONESUR NEGATIVE 09/05/2018 2229   PROTEINUR NEGATIVE 09/05/2018 2229   UROBILINOGEN 0.2 05/03/2014 2024   NITRITE NEGATIVE 09/05/2018 2229   LEUKOCYTESUR SMALL (A) 09/05/2018 2229   Sepsis Labs: @LABRCNTIP (procalcitonin:4,lacticidven:4)  ) Recent Results (from the past 240 hour(s))  Culture, Urine     Status: Abnormal   Collection Time: 09/05/18 10:29 PM  Result Value Ref Range Status   Specimen Description URINE, RANDOM  Final   Special Requests ADDED 0134 09/06/18  Final   Culture MULTIPLE SPECIES PRESENT, SUGGEST RECOLLECTION (A)  Final   Report Status 09/07/2018 FINAL  Final      Radiology Studies: Ct Angio Head W Or Wo Contrast  Result Date:  09/05/2018 CLINICAL DATA:  84 year old female code stroke presentation with CT changes of acute right MCA territory infarct. EXAM: CT ANGIOGRAPHY HEAD AND NECK CT PERFUSION BRAIN TECHNIQUE: Multidetector CT imaging of the head and neck was performed using the standard protocol during bolus administration of intravenous contrast. Multiplanar CT image reconstructions and MIPs were obtained to evaluate the vascular anatomy. Carotid stenosis measurements (when applicable) are obtained utilizing NASCET criteria, using the distal internal carotid diameter as the denominator. Multiphase CT imaging of the brain was performed following IV bolus contrast injection. Subsequent parametric perfusion maps were calculated using RAPID software. CONTRAST:  ISOVUE-370 IOPAMIDOL (ISOVUE-370) INJECTION 76% COMPARISON:  Head CT without contrast 2044 hours. FINDINGS: CT Brain Perfusion Findings: ASPECTS 7 versus 8 on the plain head CT today. CBF (<30%) Volume: 0 (erroneous). Perfusion (Tmax>6.0s) volume: 19mL, much of which seems to correspond to the aspects changes on plain CT. Mismatch Volume: 19mL, but thought to be partially erroneous. Infarction Location:Posterior right MCA territory. CTA NECK Skeleton: Absent maxillary dentition  and degenerative changes in the spine. No acute osseous abnormality identified. Upper chest: Gas trapping and/or atelectasis scattered in the upper lungs. No superior mediastinal lymphadenopathy. Other neck: Incidental venous contrast reflux into prominent lower internal jugular veins. Otherwise negative. Aortic arch: 3 vessel arch configuration. Moderate arch atherosclerosis although primarily distal to the great vessel origins. But furthermore, some of that plaque is ulcerated as seen on series 7, image 157. Right carotid system: Atherosclerosis without stenosis. Left carotid system: Soft plaque in the ventral left CCA without stenosis. Soft and calcified plaque at the left ICA origin and bulb  resulting in less than 50 % stenosis with respect to the distal vessel. Vertebral arteries: Right subclavian artery origin plaque resulting in 50% stenosis. Normal right vertebral artery origin. Dominant right vertebral artery with intermittent atherosclerosis but no hemodynamically significant stenosis to the skull base. No proximal left subclavian artery stenosis despite plaque. Non dominant left vertebral artery. Mild to moderate V1 segment stenosis. The left vertebral remains small but patent to the skull base. CTA HEAD Posterior circulation: The left vertebral functionally terminates in PICA. Moderate right V4 segment stenosis occurs due to calcified plaque near the right PICA origin which remains patent. Patent vertebrobasilar junction. Moderately irregular and diminutive basilar artery with moderate stenosis just proximal to the SCA origins. Fetal type left PCA origin with severe left posterior communicating artery stenosis (series 10, image 36). Widespread mild irregularity and stenosis of the right PCA branches. Anterior circulation: Both ICA siphons are patent. Abundant calcified plaque bilaterally. Mild bilateral supraclinoid siphon stenosis results. Normal ophthalmic and left posterior communicating artery origins. Patent carotid termini. Dominant right ACA. The diminutive left A1 segment is stenotic. The right ACA remains dominant throughout. There is moderate stenosis of the right pericallosal artery (series 10, image 34). Left MCA M1 segment is patent without stenosis. Left MCA bifurcation is patent. No left MCA branch occlusion identified. There is widespread distal left MCA branch irregularity. Right MCA M1 segment and bifurcation are patent. There is a right MCA posterior division M2 occlusion just beyond the bifurcation (series 9, image 124). Venous sinuses: Patent. Anatomic variants: Dominant right vertebral artery, the left functionally terminates in PICA. Fetal type left PCA origin. Review of  the MIP images confirms the above findings IMPRESSION: 1. Positive for LVO: Right MCA posterior M2. 2. However, CTP does not detect the known posterior right MCA core infarct (ASPECTS 7 versus 8 today), and the 19 mL of CTP penumbra seems to largely correspond to the cytotoxic edema. 3. The above was discussed by telephone with Dr. Ritta Slot on 09/05/2018 at 2102 hours. 4. Additionally, there is extensive intracranial atherosclerosis with significant stenoses: - dominant Right Vertebral Artery V4 segment (moderate). - Basilar Artery (moderate). - Left Pcomm, fetal left PCA origin (severe). - dominant Right ACA pericallosal artery (moderate). 5. Aortic Atherosclerosis (ICD10-I70.0) with some ulcerated plaque in the distal arch. Electronically Signed   By: Odessa Fleming M.D.   On: 09/05/2018 21:41   Ct Angio Neck W Or Wo Contrast  Result Date: 09/05/2018 CLINICAL DATA:  84 year old female code stroke presentation with CT changes of acute right MCA territory infarct. EXAM: CT ANGIOGRAPHY HEAD AND NECK CT PERFUSION BRAIN TECHNIQUE: Multidetector CT imaging of the head and neck was performed using the standard protocol during bolus administration of intravenous contrast. Multiplanar CT image reconstructions and MIPs were obtained to evaluate the vascular anatomy. Carotid stenosis measurements (when applicable) are obtained utilizing NASCET criteria, using the distal internal carotid diameter as  the denominator. Multiphase CT imaging of the brain was performed following IV bolus contrast injection. Subsequent parametric perfusion maps were calculated using RAPID software. CONTRAST:  125mL ISOVUE-370 IOPAMIDOL (ISOVUE-370) INJECTION 76% COMPARISON:  Head CT without contrast 2044 hours. FINDINGS: CT Brain Perfusion Findings: ASPECTS 7 versus 8 on the plain head CT today. CBF (<30%) Volume: 0 (erroneous). Perfusion (Tmax>6.0s) volume: 19mL, much of which seems to correspond to the aspects changes on plain CT.  Mismatch Volume: 19mL, but thought to be partially erroneous. Infarction Location:Posterior right MCA territory. CTA NECK Skeleton: Absent maxillary dentition and degenerative changes in the spine. No acute osseous abnormality identified. Upper chest: Gas trapping and/or atelectasis scattered in the upper lungs. No superior mediastinal lymphadenopathy. Other neck: Incidental venous contrast reflux into prominent lower internal jugular veins. Otherwise negative. Aortic arch: 3 vessel arch configuration. Moderate arch atherosclerosis although primarily distal to the great vessel origins. But furthermore, some of that plaque is ulcerated as seen on series 7, image 157. Right carotid system: Atherosclerosis without stenosis. Left carotid system: Soft plaque in the ventral left CCA without stenosis. Soft and calcified plaque at the left ICA origin and bulb resulting in less than 50 % stenosis with respect to the distal vessel. Vertebral arteries: Right subclavian artery origin plaque resulting in 50% stenosis. Normal right vertebral artery origin. Dominant right vertebral artery with intermittent atherosclerosis but no hemodynamically significant stenosis to the skull base. No proximal left subclavian artery stenosis despite plaque. Non dominant left vertebral artery. Mild to moderate V1 segment stenosis. The left vertebral remains small but patent to the skull base. CTA HEAD Posterior circulation: The left vertebral functionally terminates in PICA. Moderate right V4 segment stenosis occurs due to calcified plaque near the right PICA origin which remains patent. Patent vertebrobasilar junction. Moderately irregular and diminutive basilar artery with moderate stenosis just proximal to the SCA origins. Fetal type left PCA origin with severe left posterior communicating artery stenosis (series 10, image 36). Widespread mild irregularity and stenosis of the right PCA branches. Anterior circulation: Both ICA siphons are  patent. Abundant calcified plaque bilaterally. Mild bilateral supraclinoid siphon stenosis results. Normal ophthalmic and left posterior communicating artery origins. Patent carotid termini. Dominant right ACA. The diminutive left A1 segment is stenotic. The right ACA remains dominant throughout. There is moderate stenosis of the right pericallosal artery (series 10, image 34). Left MCA M1 segment is patent without stenosis. Left MCA bifurcation is patent. No left MCA branch occlusion identified. There is widespread distal left MCA branch irregularity. Right MCA M1 segment and bifurcation are patent. There is a right MCA posterior division M2 occlusion just beyond the bifurcation (series 9, image 124). Venous sinuses: Patent. Anatomic variants: Dominant right vertebral artery, the left functionally terminates in PICA. Fetal type left PCA origin. Review of the MIP images confirms the above findings IMPRESSION: 1. Positive for LVO: Right MCA posterior M2. 2. However, CTP does not detect the known posterior right MCA core infarct (ASPECTS 7 versus 8 today), and the 19 mL of CTP penumbra seems to largely correspond to the cytotoxic edema. 3. The above was discussed by telephone with Dr. Ritta SlotMCNEILL KIRKPATRICK on 09/05/2018 at 2102 hours. 4. Additionally, there is extensive intracranial atherosclerosis with significant stenoses: - dominant Right Vertebral Artery V4 segment (moderate). - Basilar Artery (moderate). - Left Pcomm, fetal left PCA origin (severe). - dominant Right ACA pericallosal artery (moderate). 5. Aortic Atherosclerosis (ICD10-I70.0) with some ulcerated plaque in the distal arch. Electronically Signed   By: HRexene Edison  Margo Aye M.D.   On: 09/05/2018 21:41   Mr Brain Wo Contrast  Result Date: 09/06/2018 CLINICAL DATA:  Left-sided weakness.  Right MCA infarct. EXAM: MRI HEAD WITHOUT CONTRAST TECHNIQUE: Multiplanar, multiecho pulse sequences of the brain and surrounding structures were obtained without intravenous  contrast. COMPARISON:  Head CT, CTA, and CT perfusion 09/05/2018. Brain MRI 05/25/2010. FINDINGS: Multiple sequences are moderately motion degraded despite using faster, more motion resistant imaging protocols. Brain: There is an acute posterior right MCA territory infarct with patchy cortical and white matter restricted diffusion greatest in the parietal lobe. The infarction extends into the posterior right corona radiata, posterior right temporal lobe, and insula and subinsular white matter. There is mild cytotoxic edema without mass effect or hemorrhage. Patchy to confluent T2 hyperintensities elsewhere in the cerebral white matter bilaterally are nonspecific but compatible with extensive chronic small vessel ischemic disease. Chronic lacunar infarcts are present in the basal ganglia bilaterally. Cerebral atrophy is not greater than expected for age. No mass, midline shift, or extra-axial fluid collection is seen. Vascular: FLAIR hyperintensity anteriorly in the right sylvian fissure corresponding to M2 occlusion on CTA. Hypoplastic left vertebral artery. Skull and upper cervical spine: Unremarkable bone marrow signal. Sinuses/Orbits: Bilateral cataract extraction. Trace right mastoid effusion. Clear paranasal sinuses. Other: None. IMPRESSION: 1. Moderate-sized acute posterior right MCA infarct.  No hemorrhage. 2. Extensive chronic small vessel ischemic disease. Electronically Signed   By: Sebastian Ache M.D.   On: 09/06/2018 09:02   Ct Cerebral Perfusion W Contrast  Result Date: 09/05/2018 CLINICAL DATA:  84 year old female code stroke presentation with CT changes of acute right MCA territory infarct. EXAM: CT ANGIOGRAPHY HEAD AND NECK CT PERFUSION BRAIN TECHNIQUE: Multidetector CT imaging of the head and neck was performed using the standard protocol during bolus administration of intravenous contrast. Multiplanar CT image reconstructions and MIPs were obtained to evaluate the vascular anatomy. Carotid  stenosis measurements (when applicable) are obtained utilizing NASCET criteria, using the distal internal carotid diameter as the denominator. Multiphase CT imaging of the brain was performed following IV bolus contrast injection. Subsequent parametric perfusion maps were calculated using RAPID software. CONTRAST:  ISOVUE-370 IOPAMIDOL (ISOVUE-370) INJECTION 76% COMPARISON:  Head CT without contrast 2044 hours. FINDINGS: CT Brain Perfusion Findings: ASPECTS 7 versus 8 on the plain head CT today. CBF (<30%) Volume: 0 (erroneous). Perfusion (Tmax>6.0s) volume: 19mL, much of which seems to correspond to the aspects changes on plain CT. Mismatch Volume: 19mL, but thought to be partially erroneous. Infarction Location:Posterior right MCA territory. CTA NECK Skeleton: Absent maxillary dentition and degenerative changes in the spine. No acute osseous abnormality identified. Upper chest: Gas trapping and/or atelectasis scattered in the upper lungs. No superior mediastinal lymphadenopathy. Other neck: Incidental venous contrast reflux into prominent lower internal jugular veins. Otherwise negative. Aortic arch: 3 vessel arch configuration. Moderate arch atherosclerosis although primarily distal to the great vessel origins. But furthermore, some of that plaque is ulcerated as seen on series 7, image 157. Right carotid system: Atherosclerosis without stenosis. Left carotid system: Soft plaque in the ventral left CCA without stenosis. Soft and calcified plaque at the left ICA origin and bulb resulting in less than 50 % stenosis with respect to the distal vessel. Vertebral arteries: Right subclavian artery origin plaque resulting in 50% stenosis. Normal right vertebral artery origin. Dominant right vertebral artery with intermittent atherosclerosis but no hemodynamically significant stenosis to the skull base. No proximal left subclavian artery stenosis despite plaque. Non dominant left vertebral artery. Mild to moderate  V1 segment stenosis. The left vertebral remains small but patent to the skull base. CTA HEAD Posterior circulation: The left vertebral functionally terminates in PICA. Moderate right V4 segment stenosis occurs due to calcified plaque near the right PICA origin which remains patent. Patent vertebrobasilar junction. Moderately irregular and diminutive basilar artery with moderate stenosis just proximal to the SCA origins. Fetal type left PCA origin with severe left posterior communicating artery stenosis (series 10, image 36). Widespread mild irregularity and stenosis of the right PCA branches. Anterior circulation: Both ICA siphons are patent. Abundant calcified plaque bilaterally. Mild bilateral supraclinoid siphon stenosis results. Normal ophthalmic and left posterior communicating artery origins. Patent carotid termini. Dominant right ACA. The diminutive left A1 segment is stenotic. The right ACA remains dominant throughout. There is moderate stenosis of the right pericallosal artery (series 10, image 34). Left MCA M1 segment is patent without stenosis. Left MCA bifurcation is patent. No left MCA branch occlusion identified. There is widespread distal left MCA branch irregularity. Right MCA M1 segment and bifurcation are patent. There is a right MCA posterior division M2 occlusion just beyond the bifurcation (series 9, image 124). Venous sinuses: Patent. Anatomic variants: Dominant right vertebral artery, the left functionally terminates in PICA. Fetal type left PCA origin. Review of the MIP images confirms the above findings IMPRESSION: 1. Positive for LVO: Right MCA posterior M2. 2. However, CTP does not detect the known posterior right MCA core infarct (ASPECTS 7 versus 8 today), and the 19 mL of CTP penumbra seems to largely correspond to the cytotoxic edema. 3. The above was discussed by telephone with Dr. Ritta Slot on 09/05/2018 at 2102 hours. 4. Additionally, there is extensive intracranial  atherosclerosis with significant stenoses: - dominant Right Vertebral Artery V4 segment (moderate). - Basilar Artery (moderate). - Left Pcomm, fetal left PCA origin (severe). - dominant Right ACA pericallosal artery (moderate). 5. Aortic Atherosclerosis (ICD10-I70.0) with some ulcerated plaque in the distal arch. Electronically Signed   By: Odessa Fleming M.D.   On: 09/05/2018 21:41   Dg Chest Port 1 View  Result Date: 09/06/2018 CLINICAL DATA:  Stroke EXAM: PORTABLE CHEST 1 VIEW COMPARISON:  11/18/2017 FINDINGS: There are low lung volumes. There is no focal consolidation. There are prominent interstitial markings at the lung bases likely secondary to low lung volumes. There is no pleural effusion or pneumothorax. The heart and mediastinal contours are unremarkable. 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 There is moderate osteoarthritis of the right glenohumeral joint. IMPRESSION: No active disease. Electronically Signed   By: Elige Ko   On: 09/06/2018 00:10   Ct Head Code Stroke Wo Contrast  Result Date: 09/05/2018 CLINICAL DATA:  Code stroke. 84 year old female with left side weakness. EXAM: CT HEAD WITHOUT CONTRAST TECHNIQUE: Contiguous axial images were obtained from the base of the skull through the vertex without intravenous contrast. COMPARISON:  03/07/2018 head CT. FINDINGS: Brain: Cytotoxic edema in the posterior right MCA territory on series 8, image 63. Chronic but progressed bilateral basal ganglia and external capsule white matter hypodensity since May (series 8, image 16. Other confluent bilateral cerebral white matter hypodensity appears stable. No acute intracranial hemorrhage identified. No midline shift, mass effect, or evidence of intracranial mass lesion. No ventriculomegaly. Vascular: Calcified atherosclerosis at the skull base. No suspicious intracranial vascular hyperdensity. Skull: Negative. Sinuses/Orbits: Paranasal sinuses and mastoids are clear today. Other:  No acute orbit or scalp soft tissue finding. ASPECTS [facility] Stroke Program Early CT Score) - Ganglionic level infarction (caudate,  lentiform nuclei, internal capsule, insula, M1-M3 cortex): 5 (abnormal right lentiform and M2 segment) - Supraganglionic infarction (M4-M6 cortex): 2 (abnormal right M5 segment). Total score (0-10 with 10 being normal): 7 IMPRESSION: 1. Evidence of acute posterior right MCA territory infarct. No hemorrhage or mass effect. 2. ASPECTS is 7. 3. Other bilateral progressed chronic small vessel disease since May 2019. 4. These results were communicated to Dr. Amada Jupiter at 8:58 pmon 11/16/2019by text page via the [facility] messaging system. Electronically Signed   By: Odessa Fleming M.D.   On: 09/05/2018 20:58     Scheduled Meds: .  stroke: mapping our early stages of recovery book   Does not apply Once  . aspirin  300 mg Rectal Daily   Or  . aspirin  325 mg Oral Daily  . atorvastatin  40 mg Oral q1800  . clopidogrel  75 mg Oral Daily  . enoxaparin (LOVENOX) injection  30 mg Subcutaneous Q24H  . LORazepam  1 mg Intravenous Once  . [START ON 09/08/2018] pneumococcal 23 valent vaccine  0.5 mL Intramuscular Tomorrow-1000   Continuous Infusions:   LOS: 2 days   Time Spent in minutes   30 minutes  Wendy Andrews D.O. on 09/07/2018 at 1:17 PM  Between 7am to 7pm - Please see pager noted on [email or web address]  After 7pm go to [email or web address]  And look for the night coverage person covering for me after hours  [facility] Office  [phone]

## 2018-09-08 ENCOUNTER — Inpatient Hospital Stay (HOSPITAL_COMMUNITY): Payer: Medicare HMO

## 2018-09-08 ENCOUNTER — Encounter (HOSPITAL_COMMUNITY)
Admission: EM | Disposition: A | Discharge status: Skilled Nursing Facility | Payer: Self-pay | Source: Home / Self Care | Attending: Family Medicine

## 2018-09-08 ENCOUNTER — Encounter (HOSPITAL_COMMUNITY): Payer: Self-pay | Admitting: Nurse Practitioner

## 2018-09-08 DIAGNOSIS — I340 Nonrheumatic mitral (valve) insufficiency: Secondary | ICD-10-CM

## 2018-09-08 DIAGNOSIS — I6389 Other cerebral infarction: Secondary | ICD-10-CM

## 2018-09-08 HISTORY — PX: TEE WITHOUT CARDIOVERSION: SHX5443

## 2018-09-08 HISTORY — PX: LOOP RECORDER INSERTION: EP1214

## 2018-09-08 LAB — BASIC METABOLIC PANEL
Anion gap: 9 (ref 5–15)
BUN: 19 mg/dL (ref 8–23)
CO2: 20 mmol/L — ABNORMAL LOW (ref 22–32)
Calcium: 8.5 mg/dL — ABNORMAL LOW (ref 8.9–10.3)
Chloride: 111 mmol/L (ref 98–111)
Creatinine, Ser: 0.93 mg/dL (ref 0.44–1.00)
GFR calc Af Amer: >60 mL/min (ref >60)
GFR calc non Af Amer: 54 mL/min — ABNORMAL LOW (ref >60)
Glucose, Bld: 108 mg/dL — ABNORMAL HIGH (ref 70–99)
Potassium: 3.3 mmol/L — ABNORMAL LOW (ref 3.5–5.1)
Sodium: 140 mmol/L (ref 135–145)

## 2018-09-08 SURGERY — ECHOCARDIOGRAM, TRANSESOPHAGEAL
Anesthesia: Moderate Sedation

## 2018-09-08 SURGERY — LOOP RECORDER INSERTION

## 2018-09-08 MED ORDER — DIPHENHYDRAMINE HCL 50 MG/ML IJ SOLN
INTRAMUSCULAR | Status: AC
  Filled 2018-09-08: qty 1

## 2018-09-08 MED ORDER — FENTANYL CITRATE (PF) 100 MCG/2ML IJ SOLN
INTRAMUSCULAR | Status: DC | PRN
  Administered 2018-09-08: 25 ug via INTRAVENOUS
  Administered 2018-09-08: 12.5 ug via INTRAVENOUS

## 2018-09-08 MED ORDER — LIDOCAINE-EPINEPHRINE 1 %-1:100000 IJ SOLN
INTRAMUSCULAR | Status: DC | PRN
  Administered 2018-09-08: 5 mL

## 2018-09-08 MED ORDER — FENTANYL CITRATE (PF) 100 MCG/2ML IJ SOLN
INTRAMUSCULAR | Status: AC
  Filled 2018-09-08: qty 2

## 2018-09-08 MED ORDER — MIDAZOLAM HCL (PF) 10 MG/2ML IJ SOLN
INTRAMUSCULAR | Status: DC | PRN
  Administered 2018-09-08: 2 mg via INTRAVENOUS
  Administered 2018-09-08: 1 mg via INTRAVENOUS

## 2018-09-08 MED ORDER — MIDAZOLAM HCL (PF) 5 MG/ML IJ SOLN
INTRAMUSCULAR | Status: AC
  Filled 2018-09-08: qty 2

## 2018-09-08 MED ORDER — LIDOCAINE-EPINEPHRINE 1 %-1:100000 IJ SOLN
INTRAMUSCULAR | Status: AC
  Filled 2018-09-08: qty 1

## 2018-09-08 SURGICAL SUPPLY — 2 items
LOOP REVEAL LINQSYS (Prosthesis & Implant Heart) ×1 IMPLANT
PACK LOOP INSERTION (CUSTOM PROCEDURE TRAY) ×2 IMPLANT

## 2018-09-08 NOTE — Progress Notes (Signed)
STROKE TEAM PROGRESS NOTE   SUBJECTIVE (INTERVAL HISTORY) No family is at the bedside.  Patient lying in bed, just came back from TEE and loop recorder. TEE unremarkable and loop recorder placed.   OBJECTIVE Vitals:   09/08/18 1125 09/08/18 1132 09/08/18 1142 09/08/18 1522  BP: (!) 209/87 (!) 171/70 (!) 156/61 (!) 194/76  Pulse: (!) 57 (!) 56 60 70  Resp: 16 14 18 18   Temp:  97.7 F (36.5 C)  98 F (36.7 C)  TempSrc:  Oral  Oral  SpO2: 99% 97% 96% 98%  Weight:      Height:        CBC:  Recent Labs  Lab 09/05/18 2038 09/05/18 2044  WBC 13.2*  --   NEUTROABS 10.4*  --   HGB 14.6 16.0*  HCT 46.3* 47.0*  MCV 80.2  --   PLT 274  --     Basic Metabolic Panel:  Recent Labs  Lab 09/07/18 0805 09/08/18 1215  NA 138 140  K 3.1* 3.3*  CL 110 111  CO2 21* 20*  GLUCOSE 120* 108*  BUN 18 19  CREATININE 1.08* 0.93  CALCIUM 8.7* 8.5*    Lipid Panel:     Component Value Date/Time   CHOL 227 (H) 09/06/2018 0443   TRIG 86 09/06/2018 0443   HDL 45 09/06/2018 0443   CHOLHDL 5.0 09/06/2018 0443   VLDL 17 09/06/2018 0443   LDLCALC 165 (H) 09/06/2018 0443   HgbA1c:  Lab Results  Component Value Date   HGBA1C 5.3 09/06/2018   Urine Drug Screen:     Component Value Date/Time   LABOPIA NONE DETECTED 09/05/2018 2229   COCAINSCRNUR NONE DETECTED 09/05/2018 2229   LABBENZ NONE DETECTED 09/05/2018 2229   AMPHETMU NONE DETECTED 09/05/2018 2229   THCU NONE DETECTED 09/05/2018 2229   LABBARB NONE DETECTED 09/05/2018 2229    Alcohol Level     Component Value Date/Time   ETH <10 09/05/2018 2038    IMAGING  Ct Angio Head W Or Wo Contrast Ct Angio Neck W Or Wo Contrast Ct Cerebral Perfusion W Contrast 09/05/2018 IMPRESSION:  1. Positive for LVO: Right MCA posterior M2.  2. However, CTP does not detect the known posterior right MCA core infarct (ASPECTS 7 versus 8 today), and the 19 mL of CTP penumbra seems to largely correspond to the cytotoxic edema.  3.  Additionally, there is extensive intracranial atherosclerosis with significant stenoses: - dominant Right Vertebral Artery V4 segment (moderate). - Basilar Artery (moderate). - Left Pcomm, fetal left PCA origin (severe). - dominant Right ACA pericallosal artery (moderate).  4. Aortic Atherosclerosis (ICD10-I70.0) with some ulcerated plaque in the distal arch.    Mr Brain Wo Contrast 09/06/2018 IMPRESSION:  1. Moderate-sized acute posterior right MCA infarct.  No hemorrhage.  2. Extensive chronic small vessel ischemic disease.   Ct Head Code Stroke Wo Contrast 09/05/2018 IMPRESSION:  1. Evidence of acute posterior right MCA territory infarct. No hemorrhage or mass effect.  2. ASPECTS is 7.  3. Other bilateral progressed chronic small vessel disease since May 2019.    Transthoracic Echocardiogram  - Left ventricle: The cavity size was normal. Wall thickness was   increased in a pattern of mild LVH. Systolic function was normal.   The estimated ejection fraction was in the range of 60% to 65%.   Wall motion was normal; there were no regional wall motion   abnormalities. The study is not technically sufficient to allow   evaluation of LV  diastolic function. - Mitral valve: Moderately calcified annulus. Severely thickened,   severely calcified leaflets . - Atrial septum: There was increased thickness of the septum,   consistent with lipomatous hypertrophy.  LE venous Doppler no DVT  TEE  Left Ventrical:  Normal LV function   Mitral Valve: normal,  Mild MR   Aortic Valve: normal   Tricuspid Valve: normal   Pulmonic Valve: not well vis,    Left Atrium/ Left atrial appendage: no thrombi ,   Atrial septum: no ASD o rPFO   Aorta: mild - mod calcified plaque.   PHYSICAL EXAM  Temp:  [97.7 F (36.5 C)-98.8 F (37.1 C)] 98 F (36.7 C) (11/19 1522) Pulse Rate:  [54-90] 70 (11/19 1522) Resp:  [13-20] 18 (11/19 1522) BP: (156-221)/(61-130) 194/76 (11/19 1522) SpO2:   [92 %-99 %] 98 % (11/19 1522)  General - obese, well developed, in no apparent distress.  Ophthalmologic - fundi not visualized due to noncooperation.  Cardiovascular - Regular rate and rhythm.  Mental Status -  Level of arousal and orientation to place, and person, but not orientated to time (likely due to off sedation for TEE). Language including expression, naming, repetition, comprehension was assessed and found intact. Attention span and concentration were normal. Recent and remote memory were intact. Fund of Knowledge was assessed and was intact.  Cranial Nerves II - XII - II - Visual field intact OU, however, left simultanagnosia. III, IV, VI - Extraocular movements intact. V - Facial sensation intact bilaterally. VII - Facial movement intact bilaterally. VIII - Hearing & vestibular intact bilaterally. X - Palate elevates symmetrically. XI - Chin turning & shoulder shrug intact bilaterally. XII - Tongue protrusion intact.  Motor Strength - The patient's strength was normal in right upper and lower extremities. Bulk was normal and fasciculations were absent.   Motor Tone - Muscle tone was assessed at the neck and appendages and was normal.  Reflexes - The patient's reflexes were symmetrical in all extremities and she had no pathological reflexes.  Sensory - Light touch, temperature/pinprick were assessed and were symmetrical.    Coordination - The patient had normal movements in the hands with no ataxia or dysmetria.  Tremor was absent.  Gait and Station - deferred.   ASSESSMENT/PLAN Ms. [name] is a 84 y.o. female with history of hypertension and hyperlipidemia presenting with left-sided weakness, visual neglect and stumbling on walking. She did not receive IV t-PA due to outside window.   Stroke: Right MCA infarct, embolic pattern, source unclear  Resultant left mild hemiparesis, left simultanagnosia  CT head acute posterior right MCA infarct  MRI head  -right MCA infarct  CTA H&N - right M2 occlusion, right V4, basilar artery and left P-comm stenosis.  2D Echo - EF 60 to 65%  LE venous Doppler no DVT  TEE unremarkable, no PFO or ASD  Loop recorder placed  LDL -165  HgbA1c 5.3  UDS - negative  VTE prophylaxis - SCDs  No antithrombotic prior to admission, now on aspirin 325 mg daily and clopidogrel 75 mg daily.  Continue aspirin 325 and Plavix 75 DAPT for 3 months and then aspirin alone due to intracranial stenosis.  Patient counseled to be compliant with her antithrombotic medications  Ongoing aggressive stroke risk factor management  Therapy recommendations: CIR  Disposition:  Pending  Hypertension  Stable . Permissive hypertension (OK if < 220/120) but gradually normalize in 5-7 days . Long-term BP goal normotensive  Hyperlipidemia  Lipid lowering medication  PTA: None  LDL 165, goal < 70  Current lipid lowering medication: Lipitor 40  Continue statin at discharge  Other Stroke Risk Factors  Advanced age  Obesity, Body mass index is 32.58 kg/m., recommend weight loss, diet and exercise as appropriate   Other Active Problems  UA WBC 21-50, asymptomatic  AKI, creatinine 1.51->1.08->0.93  Leukocytosis WBC 13.2  Hospital day # 3  Neurology will sign off. Please call with questions. Pt will follow up with stroke clinic NP at [facility] in about 4 weeks. Thanks for the consult.   Marvel Plan, MD PhD Stroke Neurology 09/08/2018 3:52 PM   To contact Stroke Continuity provider, please refer to [email or web address]. After hours, contact General Neurology

## 2018-09-08 NOTE — Progress Notes (Signed)
Inpatient Rehabilitation-Admissions Coordinator   [facility] met with pt at the bedside to discuss post acute rehab. AC continues attempts to locate family or friends in the area who would be able to provide recommended level of support after CIR. At this time, the pt does not know the phone number of her nephew who may be able to assist her. AC has been unable to reach her niece who is on her facesheet. AC will continue efforts, however, if family or friends unavailable to assist, pt will need SNF.   Please call if questions.   Jhonnie Garner, OTR/L  Rehab Admissions Coordinator  [phone] 09/08/2018 1:12 PM

## 2018-09-08 NOTE — Progress Notes (Signed)
Physical Therapy Treatment Patient Details Name: BRIAN KOCOUREK MRN: [id number] DOB: 1930/02/27 Today's Date: 09/08/2018    History of Present Illness ISABEL ARDILA is a 84 y.o. female with medical history significant of HTN, HLD.  Patient has been having L sided weakness present since at least 11/15.  Exact time of onset unclear.  Patient was stumbling around at that time she says.  11/16, family hadnt heard from patient so they asked for a wellfare check.  EMS found patient on floor trying to get to door. Neurology impression: with moderate right MCA distribution infarct.    PT Comments    Pt progressing with mobility despite being fatigued today. Needing min A for sit <> stand. Ambulated 2' with RW and min A with difficulty maintaining functional step length. Needs frequent vc's for safety and technique. PT will continue to follow.    Follow Up Recommendations  CIR(for post-acute rehabiliatation)     Equipment Recommendations  Rolling walker with 5" wheels;3in1 (PT)    Recommendations for Other Services Rehab consult     Precautions / Restrictions Precautions Precautions: Fall Restrictions Weight Bearing Restrictions: No    Mobility  Bed Mobility Overal bed mobility: Needs Assistance Bed Mobility: Supine to Sit     Supine to sit: Supervision;HOB elevated     General bed mobility comments: min verbal cuing for technique  Transfers Overall transfer level: Needs assistance Equipment used: Rolling walker (2 wheeled) Transfers: Sit to/from Stand Sit to Stand: Min assist         General transfer comment: min A for power up, vc's for hand placement  Ambulation/Gait Ambulation/Gait assistance: +2 safety/equipment;Min assist(chair push) Gait Distance (Feet): 60 Feet Assistive device: Rolling walker (2 wheeled) Gait Pattern/deviations: Decreased step length - left;Trunk flexed;Decreased step length - right Gait velocity: decreased Gait velocity  interpretation: <1.31 ft/sec, indicative of household ambulator General Gait Details: once pt was cued to stay within RW she did this but step length very short. Lengthened for a short time when cued but then went back to short shuffle   Stairs             Wheelchair Mobility    Modified Rankin (Stroke Patients Only) Modified Rankin (Stroke Patients Only) Pre-Morbid Rankin Score: No symptoms Modified Rankin: Moderately severe disability     Balance Overall balance assessment: Needs assistance Sitting-balance support: No upper extremity supported;Feet supported Sitting balance-Leahy Scale: Good Sitting balance - Comments: supervision for sitting balance   Standing balance support: During functional activity;Bilateral upper extremity supported Standing balance-Leahy Scale: Poor Standing balance comment: reliant on B UE                            Cognition Arousal/Alertness: Awake/alert Behavior During Therapy: WFL for tasks assessed/performed Overall Cognitive Status: No family/caregiver present to determine baseline cognitive functioning Area of Impairment: Problem solving                   Current Attention Level: Sustained Memory: Decreased recall of precautions;Decreased short-term memory Following Commands: Follows one step commands consistently     Problem Solving: Slow processing General Comments: pt followed all commands today      Exercises      General Comments General comments (skin integrity, edema, etc.): pt asking when she will get to leave      Pertinent Vitals/Pain Pain Assessment: No/denies pain    Home Living  Prior Function            PT Goals (current goals can now be found in the care plan section) Acute Rehab PT Goals Patient Stated Goal: to get stronger PT Goal Formulation: With patient Time For Goal Achievement: 09/20/18 Potential to Achieve Goals: Good Progress towards PT goals:  Progressing toward goals    Frequency    Min 4X/week      PT Plan Current plan remains appropriate    Co-evaluation              AM-PAC PT "6 Clicks" Daily Activity  Outcome Measure  Difficulty turning over in bed (including adjusting bedclothes, sheets and blankets)?: A Little Difficulty moving from lying on back to sitting on the side of the bed? : A Little Difficulty sitting down on and standing up from a chair with arms (e.g., wheelchair, bedside commode, etc,.)?: Unable Help needed moving to and from a bed to chair (including a wheelchair)?: A Little Help needed walking in hospital room?: A Lot Help needed climbing 3-5 steps with a railing? : Total 6 Click Score: 13    End of Session Equipment Utilized During Treatment: Gait belt Activity Tolerance: Patient tolerated treatment well Patient left: in chair;with call bell/phone within reach;with chair alarm set Nurse Communication: Mobility status PT Visit Diagnosis: Unsteadiness on feet (R26.81);Other abnormalities of gait and mobility (R26.89);Hemiplegia and hemiparesis Hemiplegia - Right/Left: Left Hemiplegia - dominant/non-dominant: Non-dominant Hemiplegia - caused by: Cerebral infarction     Time: [id number] PT Time Calculation (min) (ACUTE ONLY): 17 min  Charges:  $Gait Training: 8-22 mins                     Lyanne CoVictoria Lilah Mijangos, PT  Acute Rehab Services  Pager [phone] Office [phone]    Lawana ChambersVictoria L Murielle Stang 09/08/2018, 4:17 PM

## 2018-09-08 NOTE — Interval H&P Note (Signed)
History and Physical Interval Note:  09/08/2018 10:56 AM  Wendy Andrews  has presented today for surgery, with the diagnosis of stroke  The various methods of treatment have been discussed with the patient and family. After consideration of risks, benefits and other options for treatment, the patient has consented to  Procedure(s): TRANSESOPHAGEAL ECHOCARDIOGRAM (TEE) (N/A) as a surgical intervention .  The patient's history has been reviewed, patient examined, no change in status, stable for surgery.  I have reviewed the patient's chart and labs.  Questions were answered to the patient's satisfaction.     Kristeen MissPhilip Keiarra Charon

## 2018-09-08 NOTE — Care Management Note (Signed)
Case Management Note  Patient Details  Name: Wendy Andrews MRN: [id number] Date of Birth: 10/03/1930  Subjective/Objective:    Pt admitted with stroke. She is from home alone.                Action/Plan: Recommendations are for CIR. Pt will need [employer] medicare authorization prior to admission. CM following.  Expected Discharge Date:                  Expected Discharge Plan:  IP Rehab Facility  In-House Referral:     Discharge planning Services  CM Consult  Post Acute Care Choice:    Choice offered to:     DME Arranged:    DME Agency:     HH Arranged:    HH Agency:     Status of Service:  In process, will continue to follow  If discussed at Long Length of Stay Meetings, dates discussed:    Additional Comments:  Kermit BaloKelli F Emmalene Kattner, RN 09/08/2018, 12:23 PM

## 2018-09-08 NOTE — H&P (View-Only) (Signed)
  ELECTROPHYSIOLOGY CONSULT NOTE  Patient ID: Wendy Andrews MRN: [id number], DOB/AGE: 06/16/1930   Admit date: 09/05/2018 Date of Consult: 09/08/2018  Primary Physician: Roberts, Ronald, MD Primary Cardiologist: new to [facility] Reason for Consultation: Cryptogenic stroke; recommendations regarding Implantable Loop Recorder  History of Present Illness: EP has been asked to evaluate Wendy Andrews for placement of an implantable loop recorder to monitor for atrial fibrillation by Dr Xu.  The patient was admitted on 09/05/2018 with left sided weakness, visual neglect.  Imaging demonstrated right MCA infarct felt to be embolic 2/2 unknown source.  She has undergone workup for stroke including echocardiogram and carotid dopplers.  The patient has been monitored on telemetry which has demonstrated sinus rhythm with no arrhythmias.  Inpatient stroke work-up is to be completed with a TEE.   Echocardiogram this admission demonstrated EF 60-65%, mild LVH, no RWMA, LA 33.  Lab work is reviewed.  Prior to admission, the patient denies chest pain, shortness of breath, dizziness, palpitations, or syncope.  They are recovering from their stroke with plans to go to CIR at discharge.   Past Medical History:  Diagnosis Date  . Hyperlipidemia   . Hypertension   . Insomnia   . Interstitial cystitis   . Stroke (HCC)      Surgical History:  Past Surgical History:  Procedure Laterality Date  . ABDOMINAL HYSTERECTOMY    . CHOLECYSTECTOMY N/A 12/10/2013   Procedure: LAPAROSCOPIC CHOLECYSTECTOMY WITH INTRAOPERATIVE CHOLANGIOGRAM;  Surgeon: David H Newman, MD;  Location: [facility];  Service: General;  Laterality: N/A;     Medications Prior to Admission  Medication Sig Dispense Refill Last Dose  . levothyroxine (SYNTHROID, LEVOTHROID) 75 MCG tablet Take 75 mcg by mouth daily.   09/04/2018  . lisinopril (PRINIVIL,ZESTRIL) 20 MG tablet Take 20 mg by mouth daily.    09/04/2018  . LORazepam (ATIVAN) 1  MG tablet Take 1 mg by mouth daily as needed for anxiety.   unk at prn  . meloxicam (MOBIC) 7.5 MG tablet Take 7.5 mg by mouth daily.   09/04/2018  . traZODone (DESYREL) 150 MG tablet Take 150 mg by mouth at bedtime.    09/03/2018    Inpatient Medications:  .  stroke: mapping our early stages of recovery book   Does not apply Once  . aspirin  300 mg Rectal Daily   Or  . aspirin  325 mg Oral Daily  . atorvastatin  40 mg Oral q1800  . clopidogrel  75 mg Oral Daily  . enoxaparin (LOVENOX) injection  30 mg Subcutaneous Q24H  . LORazepam  1 mg Intravenous Once  . pneumococcal 23 valent vaccine  0.5 mL Intramuscular Tomorrow-1000    Allergies:  Allergies  Allergen Reactions  . Tetanus Toxoids     unknown  . Penicillins Hives and Rash    Social History   Socioeconomic History  . Marital status: Divorced    Spouse name: Not on file  . Number of children: Not on file  . Years of education: Not on file  . Highest education level: Not on file  Occupational History  . Not on file  Social Needs  . Financial resource strain: Not on file  . Food insecurity:    Worry: Not on file    Inability: Not on file  . Transportation needs:    Medical: Not on file    Non-medical: Not on file  Tobacco Use  . Smoking status: Never Smoker  . Smokeless tobacco: Never Used    Substance and Sexual Activity  . Alcohol use: No  . Drug use: No  . Sexual activity: Not on file  Lifestyle  . Physical activity:    Days per week: Not on file    Minutes per session: Not on file  . Stress: Not on file  Relationships  . Social connections:    Talks on phone: Not on file    Gets together: Not on file    Attends religious service: Not on file    Active member of club or organization: Not on file    Attends meetings of clubs or organizations: Not on file    Relationship status: Not on file  . Intimate partner violence:    Fear of current or ex partner: Not on file    Emotionally abused: Not on file      Physically abused: Not on file    Forced sexual activity: Not on file  Other Topics Concern  . Not on file  Social History Narrative  . Not on file     Family History  Problem Relation Age of Onset  . Heart failure Mother   . Heart failure Father   . Cancer Sister   . Cancer Brother        lung  . Cancer Brother        brain      Review of Systems: All other systems reviewed and are otherwise negative except as noted above.  Physical Exam: Vitals:   09/07/18 1936 09/07/18 2307 09/08/18 0318 09/08/18 0736  BP: (!) 182/80 (!) 177/84 (!) 182/88 (!) 166/130  Pulse: 67 64 64 90  Resp: 20 20 20 18  Temp: 98.2 F (36.8 C) 98.1 F (36.7 C) 98 F (36.7 C) 98.8 F (37.1 C)  TempSrc: Oral Oral Oral Oral  SpO2: 97% 99% 99% 92%  Weight:      Height:        GEN- The patient is elderly appearing, alert and oriented x 3 today, +expressive aphasia.   Head- normocephalic, atraumatic Eyes-  Sclera clear, conjunctiva pink Ears- hearing intact Oropharynx- clear Neck- supple Lungs- Clear to ausculation bilaterally, normal work of breathing Heart- Regular rate and rhythm GI- soft, NT, ND, + BS Extremities- no clubbing, cyanosis, or edema MS- no significant deformity or atrophy Skin- no rash or lesion Psych- euthymic mood, full affect   Labs:   Lab Results  Component Value Date   WBC 13.2 (H) 09/05/2018   HGB 16.0 (H) 09/05/2018   HCT 47.0 (H) 09/05/2018   MCV 80.2 09/05/2018   PLT 274 09/05/2018    Recent Labs  Lab 09/05/18 2038  09/07/18 0805  NA 134*   < > 138  K 3.1*   < > 3.1*  CL 103   < > 110  CO2 19*  --  21*  BUN 23   < > 18  CREATININE 1.51*   < > 1.08*  CALCIUM 9.1  --  8.7*  PROT 7.0  --   --   BILITOT 1.1  --   --   ALKPHOS 84  --   --   ALT 19  --   --   AST 32  --   --   GLUCOSE 129*   < > 120*   < > = values in this interval not displayed.     Radiology/Studies: Ct Angio Head W Or Wo Contrast  Result Date: 09/05/2018 CLINICAL DATA:   84-year-old female code stroke presentation with   CT changes of acute right MCA territory infarct. EXAM: CT ANGIOGRAPHY HEAD AND NECK CT PERFUSION BRAIN TECHNIQUE: Multidetector CT imaging of the head and neck was performed using the standard protocol during bolus administration of intravenous contrast. Multiplanar CT image reconstructions and MIPs were obtained to evaluate the vascular anatomy. Carotid stenosis measurements (when applicable) are obtained utilizing NASCET criteria, using the distal internal carotid diameter as the denominator. Multiphase CT imaging of the brain was performed following IV bolus contrast injection. Subsequent parametric perfusion maps were calculated using RAPID software. CONTRAST:  125mL ISOVUE-370 IOPAMIDOL (ISOVUE-370) INJECTION 76% COMPARISON:  Head CT without contrast 2044 hours. FINDINGS: CT Brain Perfusion Findings: ASPECTS 7 versus 8 on the plain head CT today. CBF (<30%) Volume: 0 (erroneous). Perfusion (Tmax>6.0s) volume: 19mL, much of which seems to correspond to the aspects changes on plain CT. Mismatch Volume: 19mL, but thought to be partially erroneous. Infarction Location:Posterior right MCA territory. CTA NECK Skeleton: Absent maxillary dentition and degenerative changes in the spine. No acute osseous abnormality identified. Upper chest: Gas trapping and/or atelectasis scattered in the upper lungs. No superior mediastinal lymphadenopathy. Other neck: Incidental venous contrast reflux into prominent lower internal jugular veins. Otherwise negative. Aortic arch: 3 vessel arch configuration. Moderate arch atherosclerosis although primarily distal to the great vessel origins. But furthermore, some of that plaque is ulcerated as seen on series 7, image 157. Right carotid system: Atherosclerosis without stenosis. Left carotid system: Soft plaque in the ventral left CCA without stenosis. Soft and calcified plaque at the left ICA origin and bulb resulting in less than 50 %  stenosis with respect to the distal vessel. Vertebral arteries: Right subclavian artery origin plaque resulting in 50% stenosis. Normal right vertebral artery origin. Dominant right vertebral artery with intermittent atherosclerosis but no hemodynamically significant stenosis to the skull base. No proximal left subclavian artery stenosis despite plaque. Non dominant left vertebral artery. Mild to moderate V1 segment stenosis. The left vertebral remains small but patent to the skull base. CTA HEAD Posterior circulation: The left vertebral functionally terminates in PICA. Moderate right V4 segment stenosis occurs due to calcified plaque near the right PICA origin which remains patent. Patent vertebrobasilar junction. Moderately irregular and diminutive basilar artery with moderate stenosis just proximal to the SCA origins. Fetal type left PCA origin with severe left posterior communicating artery stenosis (series 10, image 36). Widespread mild irregularity and stenosis of the right PCA branches. Anterior circulation: Both ICA siphons are patent. Abundant calcified plaque bilaterally. Mild bilateral supraclinoid siphon stenosis results. Normal ophthalmic and left posterior communicating artery origins. Patent carotid termini. Dominant right ACA. The diminutive left A1 segment is stenotic. The right ACA remains dominant throughout. There is moderate stenosis of the right pericallosal artery (series 10, image 34). Left MCA M1 segment is patent without stenosis. Left MCA bifurcation is patent. No left MCA branch occlusion identified. There is widespread distal left MCA branch irregularity. Right MCA M1 segment and bifurcation are patent. There is a right MCA posterior division M2 occlusion just beyond the bifurcation (series 9, image 124). Venous sinuses: Patent. Anatomic variants: Dominant right vertebral artery, the left functionally terminates in PICA. Fetal type left PCA origin. Review of the MIP images confirms the  above findings IMPRESSION: 1. Positive for LVO: Right MCA posterior M2. 2. However, CTP does not detect the known posterior right MCA core infarct (ASPECTS 7 versus 8 today), and the 19 mL of CTP penumbra seems to largely correspond to the cytotoxic edema. 3. The above was   discussed by telephone with Dr. MCNEILL KIRKPATRICK on 09/05/2018 at 2102 hours. 4. Additionally, there is extensive intracranial atherosclerosis with significant stenoses: - dominant Right Vertebral Artery V4 segment (moderate). - Basilar Artery (moderate). - Left Pcomm, fetal left PCA origin (severe). - dominant Right ACA pericallosal artery (moderate). 5. Aortic Atherosclerosis (ICD10-I70.0) with some ulcerated plaque in the distal arch. Electronically Signed   By: H  Hall M.D.   On: 09/05/2018 21:41   Ct Angio Neck W Or Wo Contrast  Result Date: 09/05/2018 CLINICAL DATA:  84-year-old female code stroke presentation with CT changes of acute right MCA territory infarct. EXAM: CT ANGIOGRAPHY HEAD AND NECK CT PERFUSION BRAIN TECHNIQUE: Multidetector CT imaging of the head and neck was performed using the standard protocol during bolus administration of intravenous contrast. Multiplanar CT image reconstructions and MIPs were obtained to evaluate the vascular anatomy. Carotid stenosis measurements (when applicable) are obtained utilizing NASCET criteria, using the distal internal carotid diameter as the denominator. Multiphase CT imaging of the brain was performed following IV bolus contrast injection. Subsequent parametric perfusion maps were calculated using RAPID software. CONTRAST:  125mL ISOVUE-370 IOPAMIDOL (ISOVUE-370) INJECTION 76% COMPARISON:  Head CT without contrast 2044 hours. FINDINGS: CT Brain Perfusion Findings: ASPECTS 7 versus 8 on the plain head CT today. CBF (<30%) Volume: 0 (erroneous). Perfusion (Tmax>6.0s) volume: 19mL, much of which seems to correspond to the aspects changes on plain CT. Mismatch Volume: 19mL, but thought  to be partially erroneous. Infarction Location:Posterior right MCA territory. CTA NECK Skeleton: Absent maxillary dentition and degenerative changes in the spine. No acute osseous abnormality identified. Upper chest: Gas trapping and/or atelectasis scattered in the upper lungs. No superior mediastinal lymphadenopathy. Other neck: Incidental venous contrast reflux into prominent lower internal jugular veins. Otherwise negative. Aortic arch: 3 vessel arch configuration. Moderate arch atherosclerosis although primarily distal to the great vessel origins. But furthermore, some of that plaque is ulcerated as seen on series 7, image 157. Right carotid system: Atherosclerosis without stenosis. Left carotid system: Soft plaque in the ventral left CCA without stenosis. Soft and calcified plaque at the left ICA origin and bulb resulting in less than 50 % stenosis with respect to the distal vessel. Vertebral arteries: Right subclavian artery origin plaque resulting in 50% stenosis. Normal right vertebral artery origin. Dominant right vertebral artery with intermittent atherosclerosis but no hemodynamically significant stenosis to the skull base. No proximal left subclavian artery stenosis despite plaque. Non dominant left vertebral artery. Mild to moderate V1 segment stenosis. The left vertebral remains small but patent to the skull base. CTA HEAD Posterior circulation: The left vertebral functionally terminates in PICA. Moderate right V4 segment stenosis occurs due to calcified plaque near the right PICA origin which remains patent. Patent vertebrobasilar junction. Moderately irregular and diminutive basilar artery with moderate stenosis just proximal to the SCA origins. Fetal type left PCA origin with severe left posterior communicating artery stenosis (series 10, image 36). Widespread mild irregularity and stenosis of the right PCA branches. Anterior circulation: Both ICA siphons are patent. Abundant calcified plaque  bilaterally. Mild bilateral supraclinoid siphon stenosis results. Normal ophthalmic and left posterior communicating artery origins. Patent carotid termini. Dominant right ACA. The diminutive left A1 segment is stenotic. The right ACA remains dominant throughout. There is moderate stenosis of the right pericallosal artery (series 10, image 34). Left MCA M1 segment is patent without stenosis. Left MCA bifurcation is patent. No left MCA branch occlusion identified. There is widespread distal left MCA branch irregularity. Right MCA M1   segment and bifurcation are patent. There is a right MCA posterior division M2 occlusion just beyond the bifurcation (series 9, image 124). Venous sinuses: Patent. Anatomic variants: Dominant right vertebral artery, the left functionally terminates in PICA. Fetal type left PCA origin. Review of the MIP images confirms the above findings IMPRESSION: 1. Positive for LVO: Right MCA posterior M2. 2. However, CTP does not detect the known posterior right MCA core infarct (ASPECTS 7 versus 8 today), and the 19 mL of CTP penumbra seems to largely correspond to the cytotoxic edema. 3. The above was discussed by telephone with Dr. MCNEILL KIRKPATRICK on 09/05/2018 at 2102 hours. 4. Additionally, there is extensive intracranial atherosclerosis with significant stenoses: - dominant Right Vertebral Artery V4 segment (moderate). - Basilar Artery (moderate). - Left Pcomm, fetal left PCA origin (severe). - dominant Right ACA pericallosal artery (moderate). 5. Aortic Atherosclerosis (ICD10-I70.0) with some ulcerated plaque in the distal arch. Electronically Signed   By: H  Hall M.D.   On: 09/05/2018 21:41   Mr Brain Wo Contrast  Result Date: 09/06/2018 CLINICAL DATA:  Left-sided weakness.  Right MCA infarct. EXAM: MRI HEAD WITHOUT CONTRAST TECHNIQUE: Multiplanar, multiecho pulse sequences of the brain and surrounding structures were obtained without intravenous contrast. COMPARISON:  Head CT, CTA,  and CT perfusion 09/05/2018. Brain MRI 05/25/2010. FINDINGS: Multiple sequences are moderately motion degraded despite using faster, more motion resistant imaging protocols. Brain: There is an acute posterior right MCA territory infarct with patchy cortical and white matter restricted diffusion greatest in the parietal lobe. The infarction extends into the posterior right corona radiata, posterior right temporal lobe, and insula and subinsular white matter. There is mild cytotoxic edema without mass effect or hemorrhage. Patchy to confluent T2 hyperintensities elsewhere in the cerebral white matter bilaterally are nonspecific but compatible with extensive chronic small vessel ischemic disease. Chronic lacunar infarcts are present in the basal ganglia bilaterally. Cerebral atrophy is not greater than expected for age. No mass, midline shift, or extra-axial fluid collection is seen. Vascular: FLAIR hyperintensity anteriorly in the right sylvian fissure corresponding to M2 occlusion on CTA. Hypoplastic left vertebral artery. Skull and upper cervical spine: Unremarkable bone marrow signal. Sinuses/Orbits: Bilateral cataract extraction. Trace right mastoid effusion. Clear paranasal sinuses. Other: None. IMPRESSION: 1. Moderate-sized acute posterior right MCA infarct.  No hemorrhage. 2. Extensive chronic small vessel ischemic disease. Electronically Signed   By: Allen  Grady M.D.   On: 09/06/2018 09:02   Ct Cerebral Perfusion W Contrast  Result Date: 09/05/2018 CLINICAL DATA:  84-year-old female code stroke presentation with CT changes of acute right MCA territory infarct. EXAM: CT ANGIOGRAPHY HEAD AND NECK CT PERFUSION BRAIN TECHNIQUE: Multidetector CT imaging of the head and neck was performed using the standard protocol during bolus administration of intravenous contrast. Multiplanar CT image reconstructions and MIPs were obtained to evaluate the vascular anatomy. Carotid stenosis measurements (when applicable)  are obtained utilizing NASCET criteria, using the distal internal carotid diameter as the denominator. Multiphase CT imaging of the brain was performed following IV bolus contrast injection. Subsequent parametric perfusion maps were calculated using RAPID software. CONTRAST:  125mL ISOVUE-370 IOPAMIDOL (ISOVUE-370) INJECTION 76% COMPARISON:  Head CT without contrast 2044 hours. FINDINGS: CT Brain Perfusion Findings: ASPECTS 7 versus 8 on the plain head CT today. CBF (<30%) Volume: 0 (erroneous). Perfusion (Tmax>6.0s) volume: 19mL, much of which seems to correspond to the aspects changes on plain CT. Mismatch Volume: 19mL, but thought to be partially erroneous. Infarction Location:Posterior right MCA territory. CTA NECK Skeleton:   Absent maxillary dentition and degenerative changes in the spine. No acute osseous abnormality identified. Upper chest: Gas trapping and/or atelectasis scattered in the upper lungs. No superior mediastinal lymphadenopathy. Other neck: Incidental venous contrast reflux into prominent lower internal jugular veins. Otherwise negative. Aortic arch: 3 vessel arch configuration. Moderate arch atherosclerosis although primarily distal to the great vessel origins. But furthermore, some of that plaque is ulcerated as seen on series 7, image 157. Right carotid system: Atherosclerosis without stenosis. Left carotid system: Soft plaque in the ventral left CCA without stenosis. Soft and calcified plaque at the left ICA origin and bulb resulting in less than 50 % stenosis with respect to the distal vessel. Vertebral arteries: Right subclavian artery origin plaque resulting in 50% stenosis. Normal right vertebral artery origin. Dominant right vertebral artery with intermittent atherosclerosis but no hemodynamically significant stenosis to the skull base. No proximal left subclavian artery stenosis despite plaque. Non dominant left vertebral artery. Mild to moderate V1 segment stenosis. The left vertebral  remains small but patent to the skull base. CTA HEAD Posterior circulation: The left vertebral functionally terminates in PICA. Moderate right V4 segment stenosis occurs due to calcified plaque near the right PICA origin which remains patent. Patent vertebrobasilar junction. Moderately irregular and diminutive basilar artery with moderate stenosis just proximal to the SCA origins. Fetal type left PCA origin with severe left posterior communicating artery stenosis (series 10, image 36). Widespread mild irregularity and stenosis of the right PCA branches. Anterior circulation: Both ICA siphons are patent. Abundant calcified plaque bilaterally. Mild bilateral supraclinoid siphon stenosis results. Normal ophthalmic and left posterior communicating artery origins. Patent carotid termini. Dominant right ACA. The diminutive left A1 segment is stenotic. The right ACA remains dominant throughout. There is moderate stenosis of the right pericallosal artery (series 10, image 34). Left MCA M1 segment is patent without stenosis. Left MCA bifurcation is patent. No left MCA branch occlusion identified. There is widespread distal left MCA branch irregularity. Right MCA M1 segment and bifurcation are patent. There is a right MCA posterior division M2 occlusion just beyond the bifurcation (series 9, image 124). Venous sinuses: Patent. Anatomic variants: Dominant right vertebral artery, the left functionally terminates in PICA. Fetal type left PCA origin. Review of the MIP images confirms the above findings IMPRESSION: 1. Positive for LVO: Right MCA posterior M2. 2. However, CTP does not detect the known posterior right MCA core infarct (ASPECTS 7 versus 8 today), and the 19 mL of CTP penumbra seems to largely correspond to the cytotoxic edema. 3. The above was discussed by telephone with Dr. MCNEILL KIRKPATRICK on 09/05/2018 at 2102 hours. 4. Additionally, there is extensive intracranial atherosclerosis with significant stenoses: -  dominant Right Vertebral Artery V4 segment (moderate). - Basilar Artery (moderate). - Left Pcomm, fetal left PCA origin (severe). - dominant Right ACA pericallosal artery (moderate). 5. Aortic Atherosclerosis (ICD10-I70.0) with some ulcerated plaque in the distal arch. Electronically Signed   By: H  Hall M.D.   On: 09/05/2018 21:41   Dg Chest Port 1 View  Result Date: 09/06/2018 CLINICAL DATA:  Stroke EXAM: PORTABLE CHEST 1 VIEW COMPARISON:  11/18/2017 FINDINGS: There are low lung volumes. There is no focal consolidation. There are prominent interstitial markings at the lung bases likely secondary to low lung volumes. There is no pleural effusion or pneumothorax. The heart and mediastinal contours are unremarkable. 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 There is moderate osteoarthritis of the right glenohumeral joint. IMPRESSION: No active disease. Electronically Signed     By: Hetal  Patel   On: 09/06/2018 00:10   Ct Head Code Stroke Wo Contrast  Result Date: 09/05/2018 CLINICAL DATA:  Code stroke. 84-year-old female with left side weakness. EXAM: CT HEAD WITHOUT CONTRAST TECHNIQUE: Contiguous axial images were obtained from the base of the skull through the vertex without intravenous contrast. COMPARISON:  03/07/2018 head CT. FINDINGS: Brain: Cytotoxic edema in the posterior right MCA territory on series 8, image 19. Chronic but progressed bilateral basal ganglia and external capsule white matter hypodensity since May (series 8, image 16. Other confluent bilateral cerebral white matter hypodensity appears stable. No acute intracranial hemorrhage identified. No midline shift, mass effect, or evidence of intracranial mass lesion. No ventriculomegaly. Vascular: Calcified atherosclerosis at the skull base. No suspicious intracranial vascular hyperdensity. Skull: Negative. Sinuses/Orbits: Paranasal sinuses and mastoids are clear today. Other: No acute orbit or scalp soft tissue finding.  ASPECTS ([place] Stroke Program Early CT Score) - Ganglionic level infarction (caudate, lentiform nuclei, internal capsule, insula, M1-M3 cortex): 5 (abnormal right lentiform and M2 segment) - Supraganglionic infarction (M4-M6 cortex): 2 (abnormal right M5 segment). Total score (0-10 with 10 being normal): 7 IMPRESSION: 1. Evidence of acute posterior right MCA territory infarct. No hemorrhage or mass effect. 2. ASPECTS is 7. 3. Other bilateral progressed chronic small vessel disease since May 2019. 4. These results were communicated to Dr. Kirkpatrick at 8:58 pmon 11/16/2019by text page via the [employer] messaging system. Electronically Signed   By: H  Hall M.D.   On: 09/05/2018 20:58   Vas Us Lower Extremity Venous (dvt)  Result Date: 09/07/2018  Lower Venous Study Indications: Stroke.  Performing Technologist: Jill Eunice RDMS, RVT  Examination Guidelines: A complete evaluation includes B-mode imaging, spectral Doppler, color Doppler, and power Doppler as needed of all accessible portions of each vessel. Bilateral testing is considered an integral part of a complete examination. Limited examinations for reoccurring indications may be performed as noted.  Right Venous Findings: +---------+---------------+---------+-----------+----------+-------+          CompressibilityPhasicitySpontaneityPropertiesSummary +---------+---------------+---------+-----------+----------+-------+ CFV      Full           Yes      Yes                          +---------+---------------+---------+-----------+----------+-------+ SFJ      Full                                                 +---------+---------------+---------+-----------+----------+-------+ FV Prox  Full                                                 +---------+---------------+---------+-----------+----------+-------+ FV Mid   Full                                                  +---------+---------------+---------+-----------+----------+-------+ FV DistalFull                                                 +---------+---------------+---------+-----------+----------+-------+   PFV      Full                                                 +---------+---------------+---------+-----------+----------+-------+ POP      Full           Yes      Yes                          +---------+---------------+---------+-----------+----------+-------+ PTV      Full                                                 +---------+---------------+---------+-----------+----------+-------+ PERO     Full                                                 +---------+---------------+---------+-----------+----------+-------+  Left Venous Findings: +---------+---------------+---------+-----------+----------+-------+          CompressibilityPhasicitySpontaneityPropertiesSummary +---------+---------------+---------+-----------+----------+-------+ CFV      Full           Yes      Yes                          +---------+---------------+---------+-----------+----------+-------+ SFJ      Full                                                 +---------+---------------+---------+-----------+----------+-------+ FV Prox  Full                                                 +---------+---------------+---------+-----------+----------+-------+ FV Mid   Full                                                 +---------+---------------+---------+-----------+----------+-------+ FV DistalFull                                                 +---------+---------------+---------+-----------+----------+-------+ PFV      Full                                                 +---------+---------------+---------+-----------+----------+-------+ POP      Full           Yes      Yes                           +---------+---------------+---------+-----------+----------+-------+   PTV      Full                                                 +---------+---------------+---------+-----------+----------+-------+ PERO     Full                                                 +---------+---------------+---------+-----------+----------+-------+    Summary: Right: There is no evidence of deep vein thrombosis in the lower extremity. No cystic structure found in the popliteal fossa. Left: There is no evidence of deep vein thrombosis in the lower extremity. No cystic structure found in the popliteal fossa.  *See table(s) above for measurements and observations. Electronically signed by Brandon Cain MD on 09/07/2018 at 4:21:01 PM.    Final     12-lead ECG sinus rhythm, rate 93 (personally reviewed) All prior EKG's in EPIC reviewed with no documented atrial fibrillation  Telemetry sinus rhythm (personally reviewed)  Assessment and Plan:  1. Cryptogenic stroke The patient presents with cryptogenic stroke.  The patient has a TEE planned for this AM.  I spoke at length with the patient about monitoring for afib with an implantable loop recorder.  Risks, benefits, and alteratives to implantable loop recorder were discussed with the patient today.   At this time, the patient is very clear in their decision to proceed with implantable loop recorder.   Wound care was reviewed with the patient (keep incision clean and dry for 3 days).  Wound check scheduled and entered in AVS.  Please call with questions.   Amber Seiler, NP 09/08/2018 8:34 AM  I have seen, examined the patient, and reviewed the above assessment and plan.  Changes to above are made where necessary.  On exam, RRR.  TEE is reviewed and no source of stroke is found.  Risks and benefits to ILR discussed with the patient who wishes to proceed.  Co Sign: Steaven Wholey, MD 09/08/2018 1:45 PM   

## 2018-09-08 NOTE — Progress Notes (Signed)
PROGRESS NOTE    Wendy Andrews  ZOX:[id number] DOB: [birth year] DOA: 09/05/2018 PCP: Burton Apley, MD   Brief Narrative:  HPI on 09/05/2018 by Dr. Lyda Perone Wendy Andrews is a 84 y.o. female with medical history significant of HTN, HLD.  Patient has been having L sided weakness present since at least yesterday.  Exact time of onset unclear.  Patient was stumbling around yesterday she says.  Today family hadnt heard from patient so they asked for a wellfare check.  EMS found patient on floor trying to get to door.  Interim history Admitted for acute CVA. Neurology consulted. Pending workup- pending TEE and loop recorder. Possibly CIR. Assessment & Plan   Acute CVA -Patient presented with left-sided weakness -Head showed acute posterior right MCA territory infarct.  No hemorrhage or mass-effect. -CTA head shows positive for LVO: Right MCA posterior M2. -MRI brain: Shows moderate sized acute posterior right MCA infarct.  No hemorrhage. -LDL 165, hemoglobin A1c 5.3 -Echocardiogram EF 60-65% -Neurology consulted and appreciated, recommended TEE/loop recorder, Aspirin 325mg  + Plavix 75mg  for 3 months, followed by aspirin alone for intracranial stenosis -lower extremity doppler negative for DVT -Placed on statin, aspirin, plavix  -PT/OT recommending inpatient rehab -CIR consulted and patient potentially a candidate  -Cardiology consulted for TEE/loop- to be done today  Asymptomatic bacteriuria  -UA rare bacteria, 21-50 WBC, negative nitrites, small leukocytes -Urine culture pending -With mild leukocytosis, 13.2.  This may be reactive to her CVA  -Antibiotics currently held for now as patient does not have any complaints.  She is currently afebrile  Essential hypertension -Home medications held, allowed for permissive hypertension -will restart lisinopril and gradually normalize BP  Hyperlipidemia -Continue statin  Hypothyroidism -will restart synthroid today  DVT  Prophylaxis  lovenox  Code Status: Full  Family Communication: none at bedside  Disposition Plan: Admitted, pending TEE/loop. Dispo CIR?  Consultants Neurology Inpatient rehab  Cardiology   Procedures  Echocardiogram Lower extremity doppler  Antibiotics   Anti-infectives (From admission, onward)   None      Subjective:   Wendy Andrews seen and examined today.  Patient has no complaints of chest pain, shortness breath, abdominal pain, nausea vomiting, diarrhea or constipation, dizziness or headache. Objective:   Vitals:   09/08/18 1105 09/08/18 1110 09/08/18 1115 09/08/18 1120  BP: (!) 216/78 (!) 221/83 (!) 213/71 (!) 220/79  Pulse: (!) 58 (!) 59 (!) 54 64  Resp: 19 13 16 19   Temp:      TempSrc:      SpO2: 99% 99% 98% 98%  Weight:      Height:        Intake/Output Summary (Last 24 hours) at 09/08/2018 1124 Last data filed at 09/08/2018 0000 Gross per 24 hour  Intake 240 ml  Output -  Net 240 ml   Filed Weights   09/05/18 2116  Weight: 86.1 kg   Exam  General: Well developed, well nourished, NAD, appears stated age  HEENT: NCAT, mucous membranes moist.   Neck: Supple  Cardiovascular: S1 S2 auscultated, RRR  Respiratory: Clear to auscultation bilaterally with equal chest rise  Abdomen: Soft, nontender, nondistended, + bowel sounds  Extremities: warm dry without cyanosis clubbing or edema  Neuro: AAOx3, left sided weakness  Psych: Normal affect and demeanor, pleasant   Data Reviewed: I have personally reviewed following labs and imaging studies  CBC: Recent Labs  Lab 09/05/18 2038 09/05/18 2044  WBC 13.2*  --   NEUTROABS 10.4*  --  HGB 14.6 16.0*  HCT 46.3* 47.0*  MCV 80.2  --   PLT 274  --    Basic Metabolic Panel: Recent Labs  Lab 09/05/18 2038 09/05/18 2044 09/07/18 0805  NA 134* 139 138  K 3.1* 3.0* 3.1*  CL 103 107 110  CO2 19*  --  21*  GLUCOSE 129* 127* 120*  BUN 23 25* 18  CREATININE 1.51* 1.40* 1.08*  CALCIUM  9.1  --  8.7*   GFR: Estimated Creatinine Clearance: 39.7 mL/min (A) (by C-G formula based on SCr of 1.08 mg/dL (H)). Liver Function Tests: Recent Labs  Lab 09/05/18 2038  AST 32  ALT 19  ALKPHOS 84  BILITOT 1.1  PROT 7.0  ALBUMIN 4.0   No results for input(s): LIPASE, AMYLASE in the last 168 hours. No results for input(s): AMMONIA in the last 168 hours. Coagulation Profile: Recent Labs  Lab 09/05/18 2038  INR 1.06   Cardiac Enzymes: Recent Labs  Lab 09/06/18 0443  CKTOTAL 316*   BNP (last 3 results) No results for input(s): PROBNP in the last 8760 hours. HbA1C: Recent Labs    09/06/18 0443  HGBA1C 5.3   CBG: No results for input(s): GLUCAP in the last 168 hours. Lipid Profile: Recent Labs    09/06/18 0443  CHOL 227*  HDL 45  LDLCALC 165*  TRIG 86  CHOLHDL 5.0   Thyroid Function Tests: No results for input(s): TSH, T4TOTAL, FREET4, T3FREE, THYROIDAB in the last 72 hours. Anemia Panel: No results for input(s): VITAMINB12, FOLATE, FERRITIN, TIBC, IRON, RETICCTPCT in the last 72 hours. Urine analysis:    Component Value Date/Time   COLORURINE YELLOW 09/05/2018 2229   APPEARANCEUR HAZY (A) 09/05/2018 2229   LABSPEC >1.046 (H) 09/05/2018 2229   PHURINE 5.0 09/05/2018 2229   GLUCOSEU NEGATIVE 09/05/2018 2229   HGBUR SMALL (A) 09/05/2018 2229   BILIRUBINUR NEGATIVE 09/05/2018 2229   KETONESUR NEGATIVE 09/05/2018 2229   PROTEINUR NEGATIVE 09/05/2018 2229   UROBILINOGEN 0.2 05/03/2014 2024   NITRITE NEGATIVE 09/05/2018 2229   LEUKOCYTESUR SMALL (A) 09/05/2018 2229   Sepsis Labs: @LABRCNTIP (procalcitonin:4,lacticidven:4)  ) Recent Results (from the past 240 hour(s))  Culture, Urine     Status: Abnormal   Collection Time: 09/05/18 10:29 PM  Result Value Ref Range Status   Specimen Description URINE, RANDOM  Final   Special Requests ADDED 0134 09/06/18  Final   Culture MULTIPLE SPECIES PRESENT, SUGGEST RECOLLECTION (A)  Final   Report Status  09/07/2018 FINAL  Final      Radiology Studies: Vas [place] Lower Extremity Venous (dvt)  Result Date: 09/07/2018  Lower Venous Study Indications: Stroke.  Performing Technologist: Farrel Demark RDMS, RVT  Examination Guidelines: A complete evaluation includes B-mode imaging, spectral Doppler, color Doppler, and power Doppler as needed of all accessible portions of each vessel. Bilateral testing is considered an integral part of a complete examination. Limited examinations for reoccurring indications may be performed as noted.  Right Venous Findings: +---------+---------------+---------+-----------+----------+-------+          CompressibilityPhasicitySpontaneityPropertiesSummary +---------+---------------+---------+-----------+----------+-------+ CFV      Full           Yes      Yes                          +---------+---------------+---------+-----------+----------+-------+ SFJ      Full                                                 +---------+---------------+---------+-----------+----------+-------+  FV Prox  Full                                                 +---------+---------------+---------+-----------+----------+-------+ FV Mid   Full                                                 +---------+---------------+---------+-----------+----------+-------+ FV DistalFull                                                 +---------+---------------+---------+-----------+----------+-------+ PFV      Full                                                 +---------+---------------+---------+-----------+----------+-------+ POP      Full           Yes      Yes                          +---------+---------------+---------+-----------+----------+-------+ PTV      Full                                                 +---------+---------------+---------+-----------+----------+-------+ PERO     Full                                                  +---------+---------------+---------+-----------+----------+-------+  Left Venous Findings: +---------+---------------+---------+-----------+----------+-------+          CompressibilityPhasicitySpontaneityPropertiesSummary +---------+---------------+---------+-----------+----------+-------+ CFV      Full           Yes      Yes                          +---------+---------------+---------+-----------+----------+-------+ SFJ      Full                                                 +---------+---------------+---------+-----------+----------+-------+ FV Prox  Full                                                 +---------+---------------+---------+-----------+----------+-------+ FV Mid   Full                                                 +---------+---------------+---------+-----------+----------+-------+  FV DistalFull                                                 +---------+---------------+---------+-----------+----------+-------+ PFV      Full                                                 +---------+---------------+---------+-----------+----------+-------+ POP      Full           Yes      Yes                          +---------+---------------+---------+-----------+----------+-------+ PTV      Full                                                 +---------+---------------+---------+-----------+----------+-------+ PERO     Full                                                 +---------+---------------+---------+-----------+----------+-------+    Summary: Right: There is no evidence of deep vein thrombosis in the lower extremity. No cystic structure found in the popliteal fossa. Left: There is no evidence of deep vein thrombosis in the lower extremity. No cystic structure found in the popliteal fossa.  *See table(s) above for measurements and observations. Electronically signed by Lemar Livings MD on 09/07/2018 at 4:21:01 PM.    Final       Scheduled Meds: . [MAR Hold]  stroke: mapping our early stages of recovery book   Does not apply Once  . [MAR Hold] aspirin  300 mg Rectal Daily   Or  . [MAR Hold] aspirin  325 mg Oral Daily  . [MAR Hold] atorvastatin  40 mg Oral q1800  . [MAR Hold] clopidogrel  75 mg Oral Daily  . [MAR Hold] enoxaparin (LOVENOX) injection  30 mg Subcutaneous Q24H  . [MAR Hold] LORazepam  1 mg Intravenous Once  . [MAR Hold] pneumococcal 23 valent vaccine  0.5 mL Intramuscular Tomorrow-1000   Continuous Infusions:   LOS: 3 days   Time Spent in minutes   30 minutes  Thunder Bridgewater D.O. on 09/08/2018 at 11:24 AM  Between 7am to 7pm - Please see pager noted on [email or web address]  After 7pm go to [email or web address]  And look for the night coverage person covering for me after hours  [facility] Office  [phone]

## 2018-09-08 NOTE — Care Management Important Message (Signed)
Important Message  Patient Details  Name: Wendy LeavellLillian B Belitz MRN: [id number] Date of Birth: 09/13/1930   Medicare Important Message Given:  Yes    Cephus Tupy 09/08/2018, 1:55 PM

## 2018-09-08 NOTE — H&P (View-Only) (Signed)
STROKE TEAM PROGRESS NOTE   SUBJECTIVE (INTERVAL HISTORY) No family is at the bedside.  Patient lying in bed, not in acute distress.  TEE pending tomorrow.    OBJECTIVE Vitals:   09/07/18 1143 09/07/18 1710 09/07/18 1936 09/07/18 2307  BP: (!) 186/81 (!) 190/84 (!) 182/80 (!) 177/84  Pulse: 65 72 67 64  Resp: 18 19 20 20  Temp: 97.7 F (36.5 C) 98.1 F (36.7 C) 98.2 F (36.8 C) 98.1 F (36.7 C)  TempSrc: Oral Oral Oral Oral  SpO2: 99% 97% 97% 99%  Weight:      Height:        CBC:  Recent Labs  Lab 09/05/18 2038 09/05/18 2044  WBC 13.2*  --   NEUTROABS 10.4*  --   HGB 14.6 16.0*  HCT 46.3* 47.0*  MCV 80.2  --   PLT 274  --     Basic Metabolic Panel:  Recent Labs  Lab 09/05/18 2038 09/05/18 2044 09/07/18 0805  NA 134* 139 138  K 3.1* 3.0* 3.1*  CL 103 107 110  CO2 19*  --  21*  GLUCOSE 129* 127* 120*  BUN 23 25* 18  CREATININE 1.51* 1.40* 1.08*  CALCIUM 9.1  --  8.7*    Lipid Panel:     Component Value Date/Time   CHOL 227 (H) 09/06/2018 0443   TRIG 86 09/06/2018 0443   HDL 45 09/06/2018 0443   CHOLHDL 5.0 09/06/2018 0443   VLDL 17 09/06/2018 0443   LDLCALC 165 (H) 09/06/2018 0443   HgbA1c:  Lab Results  Component Value Date   HGBA1C 5.3 09/06/2018   Urine Drug Screen:     Component Value Date/Time   LABOPIA NONE DETECTED 09/05/2018 2229   COCAINSCRNUR NONE DETECTED 09/05/2018 2229   LABBENZ NONE DETECTED 09/05/2018 2229   AMPHETMU NONE DETECTED 09/05/2018 2229   THCU NONE DETECTED 09/05/2018 2229   LABBARB NONE DETECTED 09/05/2018 2229    Alcohol Level     Component Value Date/Time   ETH <10 09/05/2018 2038    IMAGING  Ct Angio Head W Or Wo Contrast Ct Angio Neck W Or Wo Contrast Ct Cerebral Perfusion W Contrast 09/05/2018 IMPRESSION:  1. Positive for LVO: Right MCA posterior M2.  2. However, CTP does not detect the known posterior right MCA core infarct (ASPECTS 7 versus 8 today), and the 19 mL of CTP penumbra seems to  largely correspond to the cytotoxic edema.  3. Additionally, there is extensive intracranial atherosclerosis with significant stenoses: - dominant Right Vertebral Artery V4 segment (moderate). - Basilar Artery (moderate). - Left Pcomm, fetal left PCA origin (severe). - dominant Right ACA pericallosal artery (moderate).  4. Aortic Atherosclerosis (ICD10-I70.0) with some ulcerated plaque in the distal arch.    Mr Brain Wo Contrast 09/06/2018 IMPRESSION:  1. Moderate-sized acute posterior right MCA infarct.  No hemorrhage.  2. Extensive chronic small vessel ischemic disease.   Ct Head Code Stroke Wo Contrast 09/05/2018 IMPRESSION:  1. Evidence of acute posterior right MCA territory infarct. No hemorrhage or mass effect.  2. ASPECTS is 7.  3. Other bilateral progressed chronic small vessel disease since May 2019.    Transthoracic Echocardiogram  - Left ventricle: The cavity size was normal. Wall thickness was   increased in a pattern of mild LVH. Systolic function was normal.   The estimated ejection fraction was in the range of 60% to 65%.   Wall motion was normal; there were no regional wall motion   abnormalities.   The study is not technically sufficient to allow   evaluation of LV diastolic function. - Mitral valve: Moderately calcified annulus. Severely thickened,   severely calcified leaflets . - Atrial septum: There was increased thickness of the septum,   consistent with lipomatous hypertrophy.  LE venous Doppler no DVT   PHYSICAL EXAM  Temp:  [97.7 F (36.5 C)-98.3 F (36.8 C)] 98.1 F (36.7 C) (11/18 2307) Pulse Rate:  [64-101] 64 (11/18 2307) Resp:  [18-20] 20 (11/18 2307) BP: (117-200)/(67-90) 177/84 (11/18 2307) SpO2:  [94 %-99 %] 99 % (11/18 2307)  General - obese, well developed, in no apparent distress.  Ophthalmologic - fundi not visualized due to noncooperation.  Cardiovascular - Regular rate and rhythm.  Mental Status -  Level of arousal and  orientation to time, place, and person were intact. Language including expression, naming, repetition, comprehension was assessed and found intact. Attention span and concentration were normal. Recent and remote memory were intact. Fund of Knowledge was assessed and was intact.  Cranial Nerves II - XII - II - Visual field intact OU, however, left simultanagnosia. III, IV, VI - Extraocular movements intact. V - Facial sensation intact bilaterally. VII - Facial movement intact bilaterally. VIII - Hearing & vestibular intact bilaterally. X - Palate elevates symmetrically. XI - Chin turning & shoulder shrug intact bilaterally. XII - Tongue protrusion intact.  Motor Strength - The patient's strength was normal in right upper and lower extremities. Bulk was normal and fasciculations were absent.   Motor Tone - Muscle tone was assessed at the neck and appendages and was normal.  Reflexes - The patient's reflexes were symmetrical in all extremities and she had no pathological reflexes.  Sensory - Light touch, temperature/pinprick were assessed and were symmetrical.    Coordination - The patient had normal movements in the hands with no ataxia or dysmetria.  Tremor was absent.  Gait and Station - deferred.   ASSESSMENT/PLAN Wendy Andrews is a 84 y.o. female with history of hypertension and hyperlipidemia presenting with left-sided weakness, visual neglect and stumbling on walking. She did not receive IV t-PA due to outside window.   Stroke: Right MCA infarct, embolic pattern, source unclear  Resultant left mild hemiparesis, left simultanagnosia  CT head acute posterior right MCA infarct  MRI head -right MCA infarct  CTA H&N - right M2 occlusion, right V4, basilar artery and left P-comm stenosis.  2D Echo - EF 60 to 65%  LE venous Doppler no DVT  Pending TEE and loop recorder for further cardioembolic work-up  LDL -165  HgbA1c 5.3  UDS -negative  VTE prophylaxis  -SCDs  No antithrombotic prior to admission, now on aspirin 325 mg daily and clopidogrel 75 mg daily.  Continue aspirin 325 and Plavix 75 DAPT for 3 months and then aspirin alone due to intracranial stenosis.  Patient counseled to be compliant with her antithrombotic medications  Ongoing aggressive stroke risk factor management  Therapy recommendations: CIR  Disposition:  Pending  Hypertension  Stable . Permissive hypertension (OK if < 220/120) but gradually normalize in 5-7 days . Long-term BP goal normotensive  Hyperlipidemia  Lipid lowering medication PTA: None  LDL 165, goal < 70  Current lipid lowering medication: Lipitor 40  Continue statin at discharge  Other Stroke Risk Factors  Advanced age  Obesity, Body mass index is 32.58 kg/m., recommend weight loss, diet and exercise as appropriate   Other Active Problems  UA WBC 21-50, asymptomatic  AKI, creatinine 1.51->1.08  Leukocytosis   WBC 13.2  Hospital day # 3  Wendy Galasso, MD PhD Stroke Neurology 09/08/2018 12:11 AM   To contact Stroke Continuity provider, please refer to [email or web address]. After hours, contact General Neurology 

## 2018-09-08 NOTE — Consult Note (Addendum)
ELECTROPHYSIOLOGY CONSULT NOTE  Patient ID: Wendy Andrews MRN: [id number], DOB/AGE: 04/20/1930   Admit date: 09/05/2018 Date of Consult: 09/08/2018  Primary Physician: Burton Apley, MD Primary Cardiologist: new to [facility] Reason for Consultation: Cryptogenic stroke; recommendations regarding Implantable Loop Recorder  History of Present Illness: EP has been asked to evaluate Wendy Andrews for placement of an implantable loop recorder to monitor for atrial fibrillation by Dr Roda Shutters.  The patient was admitted on 09/05/2018 with left sided weakness, visual neglect.  Imaging demonstrated right MCA infarct felt to be embolic 2/2 unknown source.  She has undergone workup for stroke including echocardiogram and carotid dopplers.  The patient has been monitored on telemetry which has demonstrated sinus rhythm with no arrhythmias.  Inpatient stroke work-up is to be completed with a TEE.   Echocardiogram this admission demonstrated EF 60-65%, mild LVH, no RWMA, LA 33.  Lab work is reviewed.  Prior to admission, the patient denies chest pain, shortness of breath, dizziness, palpitations, or syncope.  They are recovering from their stroke with plans to go to CIR at discharge.   Past Medical History:  Diagnosis Date  . Hyperlipidemia   . Hypertension   . Insomnia   . Interstitial cystitis   . Stroke [facility])      Surgical History:  Past Surgical History:  Procedure Laterality Date  . ABDOMINAL HYSTERECTOMY    . CHOLECYSTECTOMY N/A 12/10/2013   Procedure: LAPAROSCOPIC CHOLECYSTECTOMY WITH INTRAOPERATIVE CHOLANGIOGRAM;  Surgeon: Kandis Cocking, MD;  Location: [facility];  Service: General;  Laterality: N/A;     Medications Prior to Admission  Medication Sig Dispense Refill Last Dose  . levothyroxine (SYNTHROID, LEVOTHROID) 75 MCG tablet Take 75 mcg by mouth daily.   09/04/2018  . lisinopril (PRINIVIL,ZESTRIL) 20 MG tablet Take 20 mg by mouth daily.    09/04/2018  . LORazepam (ATIVAN) 1  MG tablet Take 1 mg by mouth daily as needed for anxiety.   unk at prn  . meloxicam (MOBIC) 7.5 MG tablet Take 7.5 mg by mouth daily.   09/04/2018  . traZODone (DESYREL) 150 MG tablet Take 150 mg by mouth at bedtime.    09/03/2018    Inpatient Medications:  .  stroke: mapping our early stages of recovery book   Does not apply Once  . aspirin  300 mg Rectal Daily   Or  . aspirin  325 mg Oral Daily  . atorvastatin  40 mg Oral q1800  . clopidogrel  75 mg Oral Daily  . enoxaparin (LOVENOX) injection  30 mg Subcutaneous Q24H  . LORazepam  1 mg Intravenous Once  . pneumococcal 23 valent vaccine  0.5 mL Intramuscular Tomorrow-1000    Allergies:  Allergies  Allergen Reactions  . Tetanus Toxoids     unknown  . Penicillins Hives and Rash    Social History   Socioeconomic History  . Marital status: Divorced    Spouse name: Not on file  . Number of children: Not on file  . Years of education: Not on file  . Highest education level: Not on file  Occupational History  . Not on file  Social Needs  . Financial resource strain: Not on file  . Food insecurity:    Worry: Not on file    Inability: Not on file  . Transportation needs:    Medical: Not on file    Non-medical: Not on file  Tobacco Use  . Smoking status: Never Smoker  . Smokeless tobacco: Never Used  Substance and Sexual Activity  . Alcohol use: No  . Drug use: No  . Sexual activity: Not on file  Lifestyle  . Physical activity:    Days per week: Not on file    Minutes per session: Not on file  . Stress: Not on file  Relationships  . Social connections:    Talks on phone: Not on file    Gets together: Not on file    Attends religious service: Not on file    Active member of club or organization: Not on file    Attends meetings of clubs or organizations: Not on file    Relationship status: Not on file  . Intimate partner violence:    Fear of current or ex partner: Not on file    Emotionally abused: Not on file      Physically abused: Not on file    Forced sexual activity: Not on file  Other Topics Concern  . Not on file  Social History Narrative  . Not on file     Family History  Problem Relation Age of Onset  . Heart failure Mother   . Heart failure Father   . Cancer Sister   . Cancer Brother        lung  . Cancer Brother        brain      Review of Systems: All other systems reviewed and are otherwise negative except as noted above.  Physical Exam: Vitals:   09/07/18 1936 09/07/18 2307 09/08/18 0318 09/08/18 0736  BP: (!) 182/80 (!) 177/84 (!) 182/88 (!) 166/130  Pulse: 67 64 64 90  Resp: 20 20 20 18   Temp: 98.2 F (36.8 C) 98.1 F (36.7 C) 98 F (36.7 C) 98.8 F (37.1 C)  TempSrc: Oral Oral Oral Oral  SpO2: 97% 99% 99% 92%  Weight:      Height:        GEN- The patient is elderly appearing, alert and oriented x 3 today, +expressive aphasia.   Head- normocephalic, atraumatic Eyes-  Sclera clear, conjunctiva pink Ears- hearing intact Oropharynx- clear Neck- supple Lungs- Clear to ausculation bilaterally, normal work of breathing Heart- Regular rate and rhythm GI- soft, NT, ND, + BS Extremities- no clubbing, cyanosis, or edema MS- no significant deformity or atrophy Skin- no rash or lesion Psych- euthymic mood, full affect   Labs:   Lab Results  Component Value Date   WBC 13.2 (H) 09/05/2018   HGB 16.0 (H) 09/05/2018   HCT 47.0 (H) 09/05/2018   MCV 80.2 09/05/2018   PLT 274 09/05/2018    Recent Labs  Lab 09/05/18 2038  09/07/18 0805  NA 134*   < > 138  K 3.1*   < > 3.1*  CL 103   < > 110  CO2 19*  --  21*  BUN 23   < > 18  CREATININE 1.51*   < > 1.08*  CALCIUM 9.1  --  8.7*  PROT 7.0  --   --   BILITOT 1.1  --   --   ALKPHOS 84  --   --   ALT 19  --   --   AST 32  --   --   GLUCOSE 129*   < > 120*   < > = values in this interval not displayed.     Radiology/Studies: Ct Angio Head W Or Wo Contrast  Result Date: 09/05/2018 CLINICAL DATA:   84 year old female code stroke presentation with  CT changes of acute right MCA territory infarct. EXAM: CT ANGIOGRAPHY HEAD AND NECK CT PERFUSION BRAIN TECHNIQUE: Multidetector CT imaging of the head and neck was performed using the standard protocol during bolus administration of intravenous contrast. Multiplanar CT image reconstructions and MIPs were obtained to evaluate the vascular anatomy. Carotid stenosis measurements (when applicable) are obtained utilizing NASCET criteria, using the distal internal carotid diameter as the denominator. Multiphase CT imaging of the brain was performed following IV bolus contrast injection. Subsequent parametric perfusion maps were calculated using RAPID software. CONTRAST:  125mL ISOVUE-370 IOPAMIDOL (ISOVUE-370) INJECTION 76% COMPARISON:  Head CT without contrast 2044 hours. FINDINGS: CT Brain Perfusion Findings: ASPECTS 7 versus 8 on the plain head CT today. CBF (<30%) Volume: 0 (erroneous). Perfusion (Tmax>6.0s) volume: 19mL, much of which seems to correspond to the aspects changes on plain CT. Mismatch Volume: 19mL, but thought to be partially erroneous. Infarction Location:Posterior right MCA territory. CTA NECK Skeleton: Absent maxillary dentition and degenerative changes in the spine. No acute osseous abnormality identified. Upper chest: Gas trapping and/or atelectasis scattered in the upper lungs. No superior mediastinal lymphadenopathy. Other neck: Incidental venous contrast reflux into prominent lower internal jugular veins. Otherwise negative. Aortic arch: 3 vessel arch configuration. Moderate arch atherosclerosis although primarily distal to the great vessel origins. But furthermore, some of that plaque is ulcerated as seen on series 7, image 157. Right carotid system: Atherosclerosis without stenosis. Left carotid system: Soft plaque in the ventral left CCA without stenosis. Soft and calcified plaque at the left ICA origin and bulb resulting in less than 50 %  stenosis with respect to the distal vessel. Vertebral arteries: Right subclavian artery origin plaque resulting in 50% stenosis. Normal right vertebral artery origin. Dominant right vertebral artery with intermittent atherosclerosis but no hemodynamically significant stenosis to the skull base. No proximal left subclavian artery stenosis despite plaque. Non dominant left vertebral artery. Mild to moderate V1 segment stenosis. The left vertebral remains small but patent to the skull base. CTA HEAD Posterior circulation: The left vertebral functionally terminates in PICA. Moderate right V4 segment stenosis occurs due to calcified plaque near the right PICA origin which remains patent. Patent vertebrobasilar junction. Moderately irregular and diminutive basilar artery with moderate stenosis just proximal to the SCA origins. Fetal type left PCA origin with severe left posterior communicating artery stenosis (series 10, image 36). Widespread mild irregularity and stenosis of the right PCA branches. Anterior circulation: Both ICA siphons are patent. Abundant calcified plaque bilaterally. Mild bilateral supraclinoid siphon stenosis results. Normal ophthalmic and left posterior communicating artery origins. Patent carotid termini. Dominant right ACA. The diminutive left A1 segment is stenotic. The right ACA remains dominant throughout. There is moderate stenosis of the right pericallosal artery (series 10, image 34). Left MCA M1 segment is patent without stenosis. Left MCA bifurcation is patent. No left MCA branch occlusion identified. There is widespread distal left MCA branch irregularity. Right MCA M1 segment and bifurcation are patent. There is a right MCA posterior division M2 occlusion just beyond the bifurcation (series 9, image 124). Venous sinuses: Patent. Anatomic variants: Dominant right vertebral artery, the left functionally terminates in PICA. Fetal type left PCA origin. Review of the MIP images confirms the  above findings IMPRESSION: 1. Positive for LVO: Right MCA posterior M2. 2. However, CTP does not detect the known posterior right MCA core infarct (ASPECTS 7 versus 8 today), and the 19 mL of CTP penumbra seems to largely correspond to the cytotoxic edema. 3. The above was  discussed by telephone with Dr. Ritta Slot on 09/05/2018 at 2102 hours. 4. Additionally, there is extensive intracranial atherosclerosis with significant stenoses: - dominant Right Vertebral Artery V4 segment (moderate). - Basilar Artery (moderate). - Left Pcomm, fetal left PCA origin (severe). - dominant Right ACA pericallosal artery (moderate). 5. Aortic Atherosclerosis (ICD10-I70.0) with some ulcerated plaque in the distal arch. Electronically Signed   By: Odessa Fleming M.D.   On: 09/05/2018 21:41   Ct Angio Neck W Or Wo Contrast  Result Date: 09/05/2018 CLINICAL DATA:  84 year old female code stroke presentation with CT changes of acute right MCA territory infarct. EXAM: CT ANGIOGRAPHY HEAD AND NECK CT PERFUSION BRAIN TECHNIQUE: Multidetector CT imaging of the head and neck was performed using the standard protocol during bolus administration of intravenous contrast. Multiplanar CT image reconstructions and MIPs were obtained to evaluate the vascular anatomy. Carotid stenosis measurements (when applicable) are obtained utilizing NASCET criteria, using the distal internal carotid diameter as the denominator. Multiphase CT imaging of the brain was performed following IV bolus contrast injection. Subsequent parametric perfusion maps were calculated using RAPID software. CONTRAST:  ISOVUE-370 IOPAMIDOL (ISOVUE-370) INJECTION 76% COMPARISON:  Head CT without contrast 2044 hours. FINDINGS: CT Brain Perfusion Findings: ASPECTS 7 versus 8 on the plain head CT today. CBF (<30%) Volume: 0 (erroneous). Perfusion (Tmax>6.0s) volume: 19mL, much of which seems to correspond to the aspects changes on plain CT. Mismatch Volume: 19mL, but thought  to be partially erroneous. Infarction Location:Posterior right MCA territory. CTA NECK Skeleton: Absent maxillary dentition and degenerative changes in the spine. No acute osseous abnormality identified. Upper chest: Gas trapping and/or atelectasis scattered in the upper lungs. No superior mediastinal lymphadenopathy. Other neck: Incidental venous contrast reflux into prominent lower internal jugular veins. Otherwise negative. Aortic arch: 3 vessel arch configuration. Moderate arch atherosclerosis although primarily distal to the great vessel origins. But furthermore, some of that plaque is ulcerated as seen on series 7, image 157. Right carotid system: Atherosclerosis without stenosis. Left carotid system: Soft plaque in the ventral left CCA without stenosis. Soft and calcified plaque at the left ICA origin and bulb resulting in less than 50 % stenosis with respect to the distal vessel. Vertebral arteries: Right subclavian artery origin plaque resulting in 50% stenosis. Normal right vertebral artery origin. Dominant right vertebral artery with intermittent atherosclerosis but no hemodynamically significant stenosis to the skull base. No proximal left subclavian artery stenosis despite plaque. Non dominant left vertebral artery. Mild to moderate V1 segment stenosis. The left vertebral remains small but patent to the skull base. CTA HEAD Posterior circulation: The left vertebral functionally terminates in PICA. Moderate right V4 segment stenosis occurs due to calcified plaque near the right PICA origin which remains patent. Patent vertebrobasilar junction. Moderately irregular and diminutive basilar artery with moderate stenosis just proximal to the SCA origins. Fetal type left PCA origin with severe left posterior communicating artery stenosis (series 10, image 36). Widespread mild irregularity and stenosis of the right PCA branches. Anterior circulation: Both ICA siphons are patent. Abundant calcified plaque  bilaterally. Mild bilateral supraclinoid siphon stenosis results. Normal ophthalmic and left posterior communicating artery origins. Patent carotid termini. Dominant right ACA. The diminutive left A1 segment is stenotic. The right ACA remains dominant throughout. There is moderate stenosis of the right pericallosal artery (series 10, image 34). Left MCA M1 segment is patent without stenosis. Left MCA bifurcation is patent. No left MCA branch occlusion identified. There is widespread distal left MCA branch irregularity. Right MCA M1  segment and bifurcation are patent. There is a right MCA posterior division M2 occlusion just beyond the bifurcation (series 9, image 124). Venous sinuses: Patent. Anatomic variants: Dominant right vertebral artery, the left functionally terminates in PICA. Fetal type left PCA origin. Review of the MIP images confirms the above findings IMPRESSION: 1. Positive for LVO: Right MCA posterior M2. 2. However, CTP does not detect the known posterior right MCA core infarct (ASPECTS 7 versus 8 today), and the 19 mL of CTP penumbra seems to largely correspond to the cytotoxic edema. 3. The above was discussed by telephone with Dr. Ritta Slot on 09/05/2018 at 2102 hours. 4. Additionally, there is extensive intracranial atherosclerosis with significant stenoses: - dominant Right Vertebral Artery V4 segment (moderate). - Basilar Artery (moderate). - Left Pcomm, fetal left PCA origin (severe). - dominant Right ACA pericallosal artery (moderate). 5. Aortic Atherosclerosis (ICD10-I70.0) with some ulcerated plaque in the distal arch. Electronically Signed   By: Odessa Fleming M.D.   On: 09/05/2018 21:41   Mr Brain Wo Contrast  Result Date: 09/06/2018 CLINICAL DATA:  Left-sided weakness.  Right MCA infarct. EXAM: MRI HEAD WITHOUT CONTRAST TECHNIQUE: Multiplanar, multiecho pulse sequences of the brain and surrounding structures were obtained without intravenous contrast. COMPARISON:  Head CT, CTA,  and CT perfusion 09/05/2018. Brain MRI 05/25/2010. FINDINGS: Multiple sequences are moderately motion degraded despite using faster, more motion resistant imaging protocols. Brain: There is an acute posterior right MCA territory infarct with patchy cortical and white matter restricted diffusion greatest in the parietal lobe. The infarction extends into the posterior right corona radiata, posterior right temporal lobe, and insula and subinsular white matter. There is mild cytotoxic edema without mass effect or hemorrhage. Patchy to confluent T2 hyperintensities elsewhere in the cerebral white matter bilaterally are nonspecific but compatible with extensive chronic small vessel ischemic disease. Chronic lacunar infarcts are present in the basal ganglia bilaterally. Cerebral atrophy is not greater than expected for age. No mass, midline shift, or extra-axial fluid collection is seen. Vascular: FLAIR hyperintensity anteriorly in the right sylvian fissure corresponding to M2 occlusion on CTA. Hypoplastic left vertebral artery. Skull and upper cervical spine: Unremarkable bone marrow signal. Sinuses/Orbits: Bilateral cataract extraction. Trace right mastoid effusion. Clear paranasal sinuses. Other: None. IMPRESSION: 1. Moderate-sized acute posterior right MCA infarct.  No hemorrhage. 2. Extensive chronic small vessel ischemic disease. Electronically Signed   By: Sebastian Ache M.D.   On: 09/06/2018 09:02   Ct Cerebral Perfusion W Contrast  Result Date: 09/05/2018 CLINICAL DATA:  84 year old female code stroke presentation with CT changes of acute right MCA territory infarct. EXAM: CT ANGIOGRAPHY HEAD AND NECK CT PERFUSION BRAIN TECHNIQUE: Multidetector CT imaging of the head and neck was performed using the standard protocol during bolus administration of intravenous contrast. Multiplanar CT image reconstructions and MIPs were obtained to evaluate the vascular anatomy. Carotid stenosis measurements (when applicable)  are obtained utilizing NASCET criteria, using the distal internal carotid diameter as the denominator. Multiphase CT imaging of the brain was performed following IV bolus contrast injection. Subsequent parametric perfusion maps were calculated using RAPID software. CONTRAST:  ISOVUE-370 IOPAMIDOL (ISOVUE-370) INJECTION 76% COMPARISON:  Head CT without contrast 2044 hours. FINDINGS: CT Brain Perfusion Findings: ASPECTS 7 versus 8 on the plain head CT today. CBF (<30%) Volume: 0 (erroneous). Perfusion (Tmax>6.0s) volume: 19mL, much of which seems to correspond to the aspects changes on plain CT. Mismatch Volume: 19mL, but thought to be partially erroneous. Infarction Location:Posterior right MCA territory. CTA NECK Skeleton:  Absent maxillary dentition and degenerative changes in the spine. No acute osseous abnormality identified. Upper chest: Gas trapping and/or atelectasis scattered in the upper lungs. No superior mediastinal lymphadenopathy. Other neck: Incidental venous contrast reflux into prominent lower internal jugular veins. Otherwise negative. Aortic arch: 3 vessel arch configuration. Moderate arch atherosclerosis although primarily distal to the great vessel origins. But furthermore, some of that plaque is ulcerated as seen on series 7, image 157. Right carotid system: Atherosclerosis without stenosis. Left carotid system: Soft plaque in the ventral left CCA without stenosis. Soft and calcified plaque at the left ICA origin and bulb resulting in less than 50 % stenosis with respect to the distal vessel. Vertebral arteries: Right subclavian artery origin plaque resulting in 50% stenosis. Normal right vertebral artery origin. Dominant right vertebral artery with intermittent atherosclerosis but no hemodynamically significant stenosis to the skull base. No proximal left subclavian artery stenosis despite plaque. Non dominant left vertebral artery. Mild to moderate V1 segment stenosis. The left vertebral  remains small but patent to the skull base. CTA HEAD Posterior circulation: The left vertebral functionally terminates in PICA. Moderate right V4 segment stenosis occurs due to calcified plaque near the right PICA origin which remains patent. Patent vertebrobasilar junction. Moderately irregular and diminutive basilar artery with moderate stenosis just proximal to the SCA origins. Fetal type left PCA origin with severe left posterior communicating artery stenosis (series 10, image 36). Widespread mild irregularity and stenosis of the right PCA branches. Anterior circulation: Both ICA siphons are patent. Abundant calcified plaque bilaterally. Mild bilateral supraclinoid siphon stenosis results. Normal ophthalmic and left posterior communicating artery origins. Patent carotid termini. Dominant right ACA. The diminutive left A1 segment is stenotic. The right ACA remains dominant throughout. There is moderate stenosis of the right pericallosal artery (series 10, image 34). Left MCA M1 segment is patent without stenosis. Left MCA bifurcation is patent. No left MCA branch occlusion identified. There is widespread distal left MCA branch irregularity. Right MCA M1 segment and bifurcation are patent. There is a right MCA posterior division M2 occlusion just beyond the bifurcation (series 9, image 124). Venous sinuses: Patent. Anatomic variants: Dominant right vertebral artery, the left functionally terminates in PICA. Fetal type left PCA origin. Review of the MIP images confirms the above findings IMPRESSION: 1. Positive for LVO: Right MCA posterior M2. 2. However, CTP does not detect the known posterior right MCA core infarct (ASPECTS 7 versus 8 today), and the 19 mL of CTP penumbra seems to largely correspond to the cytotoxic edema. 3. The above was discussed by telephone with Dr. Ritta Slot on 09/05/2018 at 2102 hours. 4. Additionally, there is extensive intracranial atherosclerosis with significant stenoses: -  dominant Right Vertebral Artery V4 segment (moderate). - Basilar Artery (moderate). - Left Pcomm, fetal left PCA origin (severe). - dominant Right ACA pericallosal artery (moderate). 5. Aortic Atherosclerosis (ICD10-I70.0) with some ulcerated plaque in the distal arch. Electronically Signed   By: Odessa Fleming M.D.   On: 09/05/2018 21:41   Dg Chest Port 1 View  Result Date: 09/06/2018 CLINICAL DATA:  Stroke EXAM: PORTABLE CHEST 1 VIEW COMPARISON:  11/18/2017 FINDINGS: There are low lung volumes. There is no focal consolidation. There are prominent interstitial markings at the lung bases likely secondary to low lung volumes. There is no pleural effusion or pneumothorax. The heart and mediastinal contours are unremarkable. 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 There is moderate osteoarthritis of the right glenohumeral joint. IMPRESSION: No active disease. Electronically Signed  By: Elige Ko   On: 09/06/2018 00:10   Ct Head Code Stroke Wo Contrast  Result Date: 09/05/2018 CLINICAL DATA:  Code stroke. 84 year old female with left side weakness. EXAM: CT HEAD WITHOUT CONTRAST TECHNIQUE: Contiguous axial images were obtained from the base of the skull through the vertex without intravenous contrast. COMPARISON:  03/07/2018 head CT. FINDINGS: Brain: Cytotoxic edema in the posterior right MCA territory on series 8, image 63. Chronic but progressed bilateral basal ganglia and external capsule white matter hypodensity since May (series 8, image 16. Other confluent bilateral cerebral white matter hypodensity appears stable. No acute intracranial hemorrhage identified. No midline shift, mass effect, or evidence of intracranial mass lesion. No ventriculomegaly. Vascular: Calcified atherosclerosis at the skull base. No suspicious intracranial vascular hyperdensity. Skull: Negative. Sinuses/Orbits: Paranasal sinuses and mastoids are clear today. Other: No acute orbit or scalp soft tissue finding.  ASPECTS [facility] Stroke Program Early CT Score) - Ganglionic level infarction (caudate, lentiform nuclei, internal capsule, insula, M1-M3 cortex): 5 (abnormal right lentiform and M2 segment) - Supraganglionic infarction (M4-M6 cortex): 2 (abnormal right M5 segment). Total score (0-10 with 10 being normal): 7 IMPRESSION: 1. Evidence of acute posterior right MCA territory infarct. No hemorrhage or mass effect. 2. ASPECTS is 7. 3. Other bilateral progressed chronic small vessel disease since May 2019. 4. These results were communicated to Dr. Amada Jupiter at 8:58 pmon 11/16/2019by text page via the [facility] messaging system. Electronically Signed   By: Odessa Fleming M.D.   On: 09/05/2018 20:58   Vas [place] Lower Extremity Venous (dvt)  Result Date: 09/07/2018  Lower Venous Study Indications: Stroke.  Performing Technologist: Farrel Demark RDMS, RVT  Examination Guidelines: A complete evaluation includes B-mode imaging, spectral Doppler, color Doppler, and power Doppler as needed of all accessible portions of each vessel. Bilateral testing is considered an integral part of a complete examination. Limited examinations for reoccurring indications may be performed as noted.  Right Venous Findings: +---------+---------------+---------+-----------+----------+-------+          CompressibilityPhasicitySpontaneityPropertiesSummary +---------+---------------+---------+-----------+----------+-------+ CFV      Full           Yes      Yes                          +---------+---------------+---------+-----------+----------+-------+ SFJ      Full                                                 +---------+---------------+---------+-----------+----------+-------+ FV Prox  Full                                                 +---------+---------------+---------+-----------+----------+-------+ FV Mid   Full                                                  +---------+---------------+---------+-----------+----------+-------+ FV DistalFull                                                 +---------+---------------+---------+-----------+----------+-------+  PFV      Full                                                 +---------+---------------+---------+-----------+----------+-------+ POP      Full           Yes      Yes                          +---------+---------------+---------+-----------+----------+-------+ PTV      Full                                                 +---------+---------------+---------+-----------+----------+-------+ PERO     Full                                                 +---------+---------------+---------+-----------+----------+-------+  Left Venous Findings: +---------+---------------+---------+-----------+----------+-------+          CompressibilityPhasicitySpontaneityPropertiesSummary +---------+---------------+---------+-----------+----------+-------+ CFV      Full           Yes      Yes                          +---------+---------------+---------+-----------+----------+-------+ SFJ      Full                                                 +---------+---------------+---------+-----------+----------+-------+ FV Prox  Full                                                 +---------+---------------+---------+-----------+----------+-------+ FV Mid   Full                                                 +---------+---------------+---------+-----------+----------+-------+ FV DistalFull                                                 +---------+---------------+---------+-----------+----------+-------+ PFV      Full                                                 +---------+---------------+---------+-----------+----------+-------+ POP      Full           Yes      Yes                           +---------+---------------+---------+-----------+----------+-------+  PTV      Full                                                 +---------+---------------+---------+-----------+----------+-------+ PERO     Full                                                 +---------+---------------+---------+-----------+----------+-------+    Summary: Right: There is no evidence of deep vein thrombosis in the lower extremity. No cystic structure found in the popliteal fossa. Left: There is no evidence of deep vein thrombosis in the lower extremity. No cystic structure found in the popliteal fossa.  *See table(s) above for measurements and observations. Electronically signed by Lemar Livings MD on 09/07/2018 at 4:21:01 PM.    Final     12-lead ECG sinus rhythm, rate 93 (personally reviewed) All prior EKG's in EPIC reviewed with no documented atrial fibrillation  Telemetry sinus rhythm (personally reviewed)  Assessment and Plan:  1. Cryptogenic stroke The patient presents with cryptogenic stroke.  The patient has a TEE planned for this AM.  I spoke at length with the patient about monitoring for afib with an implantable loop recorder.  Risks, benefits, and alteratives to implantable loop recorder were discussed with the patient today.   At this time, the patient is very clear in their decision to proceed with implantable loop recorder.   Wound care was reviewed with the patient (keep incision clean and dry for 3 days).  Wound check scheduled and entered in AVS.  Please call with questions.   Gypsy Balsam, NP 09/08/2018 8:34 AM  I have seen, examined the patient, and reviewed the above assessment and plan.  Changes to above are made where necessary.  On exam, RRR.  TEE is reviewed and no source of stroke is found.  Risks and benefits to ILR discussed with the patient who wishes to proceed.  Co Sign: Hillis Range, MD 09/08/2018 1:45 PM

## 2018-09-08 NOTE — CV Procedure (Signed)
    Transesophageal Echocardiogram Note  Damian LeavellLillian B Goris [id number] 05/23/1930  Procedure: Transesophageal Echocardiogram Indications: CVA   Procedure Details Consent: Obtained Time Out: Verified patient identification, verified procedure, site/side was marked, verified correct patient position, special equipment/implants available, Radiology Safety Procedures followed,  medications/allergies/relevent history reviewed, required imaging and test results available.  Performed  Medications:  During this procedure the patient is administered a total of Versed 3  mg and Fentanyl 37.5  mcg  to achieve and maintain moderate conscious sedation.  The patient's heart rate, blood pressure, and oxygen saturation are monitored continuously during the procedure. The period of conscious sedation is 30  minutes, of which I was present face-to-face 100% of this time.  Left Ventrical:  Normal LV function   Mitral Valve: normal,  Mild MR   Aortic Valve: normal   Tricuspid Valve: normal   Pulmonic Valve: not well vis,    Left Atrium/ Left atrial appendage: no thrombi ,   Atrial septum: no ASD o rPFO   Aorta: mild - mod calcified plaque.      Complications: No apparent complications Patient did tolerate procedure well.   Vesta MixerPhilip J. Nahser, Montez HagemanJr., MD, [facility] SystemFACC 09/08/2018, 11:26 AM

## 2018-09-08 NOTE — Progress Notes (Signed)
Pt back to room from procedure. Pt alert and verbally responsive; loop recorder incision site on chest remains clean, dry and intact with no stain or drainage noted. VSS; telemetry reapplied. Will continue to closely monitor. Dionne Bucy. Amo Amaury Kuzel RN   09/08/18 1522  Vitals  Temp 98 F (36.7 C)  Temp Source Oral  BP (!) 194/76  MAP (mmHg) 108  BP Location Left Arm  BP Method Automatic  Patient Position (if appropriate) Lying  Pulse Rate 70  Pulse Rate Source Dinamap  Resp 18  Oxygen Therapy  SpO2 98 %  O2 Device Room Air  Pain Assessment  Pain Scale 0-10  Pain Score 0

## 2018-09-08 NOTE — Interval H&P Note (Signed)
History and Physical Interval Note:  09/08/2018 1:47 PM  Wendy LeavellLillian B Capobianco  has presented today for surgery, with the diagnosis of stroke  The various methods of treatment have been discussed with the patient and family. After consideration of risks, benefits and other options for treatment, the patient has consented to  Procedure(s): LOOP RECORDER INSERTION (N/A) as a surgical intervention .  The patient's history has been reviewed, patient examined, no change in status, stable for surgery.  I have reviewed the patient's chart and labs.  Questions were answered to the patient's satisfaction.     Hillis RangeJames Tracey Stewart

## 2018-09-08 NOTE — Progress Notes (Signed)
STROKE TEAM PROGRESS NOTE   SUBJECTIVE (INTERVAL HISTORY) No family is at the bedside.  Patient lying in bed, not in acute distress.  TEE pending tomorrow.    OBJECTIVE Vitals:   09/07/18 1143 09/07/18 1710 09/07/18 1936 09/07/18 2307  BP: (!) 186/81 (!) 190/84 (!) 182/80 (!) 177/84  Pulse: 65 72 67 64  Resp: 18 19 20 20   Temp: 97.7 F (36.5 C) 98.1 F (36.7 C) 98.2 F (36.8 C) 98.1 F (36.7 C)  TempSrc: Oral Oral Oral Oral  SpO2: 99% 97% 97% 99%  Weight:      Height:        CBC:  Recent Labs  Lab 09/05/18 2038 09/05/18 2044  WBC 13.2*  --   NEUTROABS 10.4*  --   HGB 14.6 16.0*  HCT 46.3* 47.0*  MCV 80.2  --   PLT 274  --     Basic Metabolic Panel:  Recent Labs  Lab 09/05/18 2038 09/05/18 2044 09/07/18 0805  NA 134* 139 138  K 3.1* 3.0* 3.1*  CL 103 107 110  CO2 19*  --  21*  GLUCOSE 129* 127* 120*  BUN 23 25* 18  CREATININE 1.51* 1.40* 1.08*  CALCIUM 9.1  --  8.7*    Lipid Panel:     Component Value Date/Time   CHOL 227 (H) 09/06/2018 0443   TRIG 86 09/06/2018 0443   HDL 45 09/06/2018 0443   CHOLHDL 5.0 09/06/2018 0443   VLDL 17 09/06/2018 0443   LDLCALC 165 (H) 09/06/2018 0443   HgbA1c:  Lab Results  Component Value Date   HGBA1C 5.3 09/06/2018   Urine Drug Screen:     Component Value Date/Time   LABOPIA NONE DETECTED 09/05/2018 2229   COCAINSCRNUR NONE DETECTED 09/05/2018 2229   LABBENZ NONE DETECTED 09/05/2018 2229   AMPHETMU NONE DETECTED 09/05/2018 2229   THCU NONE DETECTED 09/05/2018 2229   LABBARB NONE DETECTED 09/05/2018 2229    Alcohol Level     Component Value Date/Time   ETH <10 09/05/2018 2038    IMAGING  Ct Angio Head W Or Wo Contrast Ct Angio Neck W Or Wo Contrast Ct Cerebral Perfusion W Contrast 09/05/2018 IMPRESSION:  1. Positive for LVO: Right MCA posterior M2.  2. However, CTP does not detect the known posterior right MCA core infarct (ASPECTS 7 versus 8 today), and the 19 mL of CTP penumbra seems to  largely correspond to the cytotoxic edema.  3. Additionally, there is extensive intracranial atherosclerosis with significant stenoses: - dominant Right Vertebral Artery V4 segment (moderate). - Basilar Artery (moderate). - Left Pcomm, fetal left PCA origin (severe). - dominant Right ACA pericallosal artery (moderate).  4. Aortic Atherosclerosis (ICD10-I70.0) with some ulcerated plaque in the distal arch.    Mr Brain Wo Contrast 09/06/2018 IMPRESSION:  1. Moderate-sized acute posterior right MCA infarct.  No hemorrhage.  2. Extensive chronic small vessel ischemic disease.   Ct Head Code Stroke Wo Contrast 09/05/2018 IMPRESSION:  1. Evidence of acute posterior right MCA territory infarct. No hemorrhage or mass effect.  2. ASPECTS is 7.  3. Other bilateral progressed chronic small vessel disease since May 2019.    Transthoracic Echocardiogram  - Left ventricle: The cavity size was normal. Wall thickness was   increased in a pattern of mild LVH. Systolic function was normal.   The estimated ejection fraction was in the range of 60% to 65%.   Wall motion was normal; there were no regional wall motion   abnormalities.  The study is not technically sufficient to allow   evaluation of LV diastolic function. - Mitral valve: Moderately calcified annulus. Severely thickened,   severely calcified leaflets . - Atrial septum: There was increased thickness of the septum,   consistent with lipomatous hypertrophy.  LE venous Doppler no DVT   PHYSICAL EXAM  Temp:  [97.7 F (36.5 C)-98.3 F (36.8 C)] 98.1 F (36.7 C) (11/18 2307) Pulse Rate:  [64-101] 64 (11/18 2307) Resp:  [18-20] 20 (11/18 2307) BP: (117-200)/(67-90) 177/84 (11/18 2307) SpO2:  [94 %-99 %] 99 % (11/18 2307)  General - obese, well developed, in no apparent distress.  Ophthalmologic - fundi not visualized due to noncooperation.  Cardiovascular - Regular rate and rhythm.  Mental Status -  Level of arousal and  orientation to time, place, and person were intact. Language including expression, naming, repetition, comprehension was assessed and found intact. Attention span and concentration were normal. Recent and remote memory were intact. Fund of Knowledge was assessed and was intact.  Cranial Nerves II - XII - II - Visual field intact OU, however, left simultanagnosia. III, IV, VI - Extraocular movements intact. V - Facial sensation intact bilaterally. VII - Facial movement intact bilaterally. VIII - Hearing & vestibular intact bilaterally. X - Palate elevates symmetrically. XI - Chin turning & shoulder shrug intact bilaterally. XII - Tongue protrusion intact.  Motor Strength - The patient's strength was normal in right upper and lower extremities. Bulk was normal and fasciculations were absent.   Motor Tone - Muscle tone was assessed at the neck and appendages and was normal.  Reflexes - The patient's reflexes were symmetrical in all extremities and she had no pathological reflexes.  Sensory - Light touch, temperature/pinprick were assessed and were symmetrical.    Coordination - The patient had normal movements in the hands with no ataxia or dysmetria.  Tremor was absent.  Gait and Station - deferred.   ASSESSMENT/PLAN Ms. Wendy Andrews is a 84 y.o. female with history of hypertension and hyperlipidemia presenting with left-sided weakness, visual neglect and stumbling on walking. She did not receive IV t-PA due to outside window.   Stroke: Right MCA infarct, embolic pattern, source unclear  Resultant left mild hemiparesis, left simultanagnosia  CT head acute posterior right MCA infarct  MRI head -right MCA infarct  CTA H&N - right M2 occlusion, right V4, basilar artery and left P-comm stenosis.  2D Echo - EF 60 to 65%  LE venous Doppler no DVT  Pending TEE and loop recorder for further cardioembolic work-up  LDL -165  HgbA1c 5.3  UDS -negative  VTE prophylaxis  -SCDs  No antithrombotic prior to admission, now on aspirin 325 mg daily and clopidogrel 75 mg daily.  Continue aspirin 325 and Plavix 75 DAPT for 3 months and then aspirin alone due to intracranial stenosis.  Patient counseled to be compliant with her antithrombotic medications  Ongoing aggressive stroke risk factor management  Therapy recommendations: CIR  Disposition:  Pending  Hypertension  Stable . Permissive hypertension (OK if < 220/120) but gradually normalize in 5-7 days . Long-term BP goal normotensive  Hyperlipidemia  Lipid lowering medication PTA: None  LDL 165, goal < 70  Current lipid lowering medication: Lipitor 40  Continue statin at discharge  Other Stroke Risk Factors  Advanced age  Obesity, Body mass index is 32.58 kg/m., recommend weight loss, diet and exercise as appropriate   Other Active Problems  UA WBC 21-50, asymptomatic  AKI, creatinine 1.51->1.08  Leukocytosis  WBC 13.2  Hospital day # 3  Marvel Plan, MD PhD Stroke Neurology 09/08/2018 12:11 AM   To contact Stroke Continuity provider, please refer to [email or web address]. After hours, contact General Neurology

## 2018-09-09 ENCOUNTER — Encounter (HOSPITAL_COMMUNITY): Payer: Self-pay | Admitting: Internal Medicine

## 2018-09-09 LAB — BASIC METABOLIC PANEL
Anion gap: 10 (ref 5–15)
BUN: 17 mg/dL (ref 8–23)
CO2: 21 mmol/L — ABNORMAL LOW (ref 22–32)
Calcium: 8.9 mg/dL (ref 8.9–10.3)
Chloride: 109 mmol/L (ref 98–111)
Creatinine, Ser: 1.15 mg/dL — ABNORMAL HIGH (ref 0.44–1.00)
GFR calc Af Amer: 48 mL/min — ABNORMAL LOW (ref >60)
GFR calc non Af Amer: 42 mL/min — ABNORMAL LOW (ref >60)
Glucose, Bld: 106 mg/dL — ABNORMAL HIGH (ref 70–99)
Potassium: 3.4 mmol/L — ABNORMAL LOW (ref 3.5–5.1)
Sodium: 140 mmol/L (ref 135–145)

## 2018-09-09 LAB — CBC
HCT: 43.6 % (ref 36.0–46.0)
Hemoglobin: 13.8 g/dL (ref 12.0–15.0)
MCH: 25.1 pg — ABNORMAL LOW (ref 26.0–34.0)
MCHC: 31.7 g/dL (ref 30.0–36.0)
MCV: 79.4 fL — ABNORMAL LOW (ref 80.0–100.0)
Platelets: 286 10*3/uL (ref 150–400)
RBC: 5.49 MIL/uL — ABNORMAL HIGH (ref 3.87–5.11)
RDW: 16.1 % — ABNORMAL HIGH (ref 11.5–15.5)
WBC: 7.5 10*3/uL (ref 4.0–10.5)
nRBC: 0 % (ref 0.0–0.2)

## 2018-09-09 MED ORDER — ENOXAPARIN SODIUM 40 MG/0.4ML ~~LOC~~ SOLN
40.0000 mg | SUBCUTANEOUS | Status: DC
  Administered 2018-09-10 – 2018-09-15 (×6): 40 mg via SUBCUTANEOUS
  Filled 2018-09-09 (×5): qty 0.4

## 2018-09-09 MED ORDER — LEVOTHYROXINE SODIUM 75 MCG PO TABS
75.0000 ug | ORAL_TABLET | Freq: Every day | ORAL | Status: DC
  Administered 2018-09-09 – 2018-09-15 (×7): 75 ug via ORAL
  Filled 2018-09-09 (×7): qty 1

## 2018-09-09 MED ORDER — TRAZODONE HCL 50 MG PO TABS
150.0000 mg | ORAL_TABLET | Freq: Every day | ORAL | Status: DC
  Administered 2018-09-09 – 2018-09-14 (×6): 150 mg via ORAL
  Filled 2018-09-09 (×6): qty 1

## 2018-09-09 MED ORDER — HYDRALAZINE HCL 20 MG/ML IJ SOLN
10.0000 mg | Freq: Once | INTRAMUSCULAR | Status: AC
  Administered 2018-09-09: 10 mg via INTRAVENOUS
  Filled 2018-09-09: qty 1

## 2018-09-09 NOTE — Progress Notes (Signed)
BP 220/138. Text paged NP, Schorr.

## 2018-09-09 NOTE — Progress Notes (Signed)
Physical Therapy Treatment Patient Details Name: Wendy Andrews MRN: [id number] DOB: 08-07-1930 Today's Date: 09/09/2018    History of Present Illness Wendy Andrews is a 84 y.o. female with medical history significant of HTN, HLD.  Patient has been having L sided weakness present since at least 11/15.  Exact time of onset unclear.  Patient was stumbling around at that time she says.  11/16, family hadnt heard from patient so they asked for a wellfare check.  EMS found patient on floor trying to get to door. Neurology impression: with moderate right MCA distribution infarct.    PT Comments    Pt tolerated session well and able to increase ambulation distance.  Continues to require overall min assist for balance without AD, and demos short/shuffling steps and decreased activity tolerance.  Feel pt would be an excellent candidate for CIR if she is able to secure 24/7 supervision.  Will continue to see pt acutely to progress mobility and activity tolerance.     Follow Up Recommendations  CIR(if able to secure 24/7 supervision following d/c from CIR)     Equipment Recommendations  Other (comment)(to be determined at next venue of care)    Recommendations for Other Services Rehab consult     Precautions / Restrictions Precautions Precautions: Fall Restrictions Weight Bearing Restrictions: No    Mobility  Bed Mobility Overal bed mobility: Needs Assistance Bed Mobility: Supine to Sit     Supine to sit: Supervision;HOB elevated(Simultaneous filing. User may not have seen previous data.)     General bed mobility comments: in chair on arrival  Transfers Overall transfer level: Needs assistance Equipment used: 1 person hand held assist Transfers: Sit to/from Stand Sit to Stand: Min guard Stand pivot transfers: Min guard       General transfer comment: HHA for balance; no boosting required for 2x sit<>stand  Ambulation/Gait Ambulation/Gait assistance: Min assist;Mod  assist Gait Distance (Feet): 125 Feet Assistive device: 1 person hand held assist Gait Pattern/deviations: Step-through pattern;Decreased stride length;Decreased step length - left;Decreased step length - right;Shuffle Gait velocity: decreased   General Gait Details: somewhat improved step/stride length initially with HHA, however returns to short/shuffling steps with fatigue after about 60'.     Stairs             Wheelchair Mobility    Modified Rankin (Stroke Patients Only)       Balance Overall balance assessment: Needs assistance Sitting-balance support: No upper extremity supported;Feet supported Sitting balance-Leahy Scale: Good Sitting balance - Comments: supervision for sitting balance   Standing balance support: Single extremity supported;During functional activity Standing balance-Leahy Scale: Poor Standing balance comment: reliant on UE support for balance                            Cognition Arousal/Alertness: Awake/alert Behavior During Therapy: WFL for tasks assessed/performed Overall Cognitive Status: No family/caregiver present to determine baseline cognitive functioning Area of Impairment: Problem solving                   Current Attention Level: Sustained Memory: Decreased recall of precautions;Decreased short-term memory Following Commands: Follows one step commands consistently   Awareness: Emergent Problem Solving: Slow processing;Requires verbal cues General Comments: Slower processing but consistent with command following; A&Ox4      Exercises      General Comments        Pertinent Vitals/Pain      Home Living  Prior Function            PT Goals (current goals can now be found in the care plan section) Acute Rehab PT Goals Patient Stated Goal: to get stronger PT Goal Formulation: With patient Time For Goal Achievement: 09/20/18 Potential to Achieve Goals: Good Progress  towards PT goals: Progressing toward goals    Frequency    Min 4X/week      PT Plan Current plan remains appropriate    Co-evaluation              AM-PAC PT "6 Clicks" Daily Activity  Outcome Measure  Difficulty turning over in bed (including adjusting bedclothes, sheets and blankets)?: A Little Difficulty moving from lying on back to sitting on the side of the bed? : A Little Difficulty sitting down on and standing up from a chair with arms (e.g., wheelchair, bedside commode, etc,.)?: A Little Help needed moving to and from a bed to chair (including a wheelchair)?: A Little Help needed walking in hospital room?: A Little Help needed climbing 3-5 steps with a railing? : A Lot 6 Click Score: 17    End of Session Equipment Utilized During Treatment: Gait belt Activity Tolerance: Patient tolerated treatment well Patient left: in chair;with call bell/phone within reach;with chair alarm set Nurse Communication: Mobility status PT Visit Diagnosis: Unsteadiness on feet (R26.81);Other abnormalities of gait and mobility (R26.89);Hemiplegia and hemiparesis Hemiplegia - Right/Left: Left Hemiplegia - dominant/non-dominant: Non-dominant Hemiplegia - caused by: Cerebral infarction     Time: 0927-0938 PT Time Calculation (min) (ACUTE ONLY): 11 min  Charges:  $Gait Training: 8-22 mins                      Stephania FragminCaitlin E Caedmon Louque 09/09/2018, 9:50 AM

## 2018-09-09 NOTE — Progress Notes (Addendum)
PROGRESS NOTE    Wendy Andrews  UJW:[id number] DOB: May 12, 1930 DOA: 09/05/2018 PCP: Burton Apley, MD   Brief Narrative:   84 y.o. femalef HTN, HLD as well as cholecystectomy 2015 Presented to the emergency room 11/16 with 1 day history L sided weakness present since at least yesterday.    Patient was stumbling around yesterday she says.  Today family hadnt heard from patient so they asked for a wellfare check.  EMS found patient on floor trying to get to door.  Interim history Admitted for acute CVA. Neurology consulted. Pending workup- pending TEE and loop recorder. Possibly CIR. Assessment & Plan   Acute CVA -MRI showed MCA territory infarct CT a positive for LVO echo 66 5% LDL 165 A1c 5.3- Neurology recommended Plavix aspirin for 3 months followed by aspirin alone TEE showed no thrombus or patient [place] Loop recorder was placed 11/19  Asymptomatic bacteriuria  -UA rare bacteria, 21-50 WBC, negative nitrites, small leukocytes Urine analysis showed multiple colony-forming units- -Antibiotics currently held for now as patient does not have any complaints.  She is currently afebrile  Essential hypertension -Home medications held, allowed for permissive hypertension -will restart lisinopril and gradually normalize BP  Hyperlipidemia -Continue statin  Hypothyroidism -will restart synthroid today  DVT Prophylaxis  lovenox  Code Status: Full  Family Communication: none at bedside  Disposition Plan: Admitted, pending TEE/loop.  Unclear where patient will go as there is no clear family disposition and I think she may need rehab-she seems confused at the process and have attempted to explain this to her case manager and social worker will follow up  Consultants Neurology Inpatient rehab  Cardiology   Procedures  Echocardiogram Lower extremity doppler  Antibiotics   Anti-infectives (From admission, onward)   None      Subjective:   Objective:    Vitals:   09/08/18 2346 09/09/18 0347 09/09/18 0724 09/09/18 0900  BP: 116/88 (!) 143/66 (!) 165/110 (!) 179/156  Pulse: 66 73 77 62  Resp: 18 18 18    Temp: 98.2 F (36.8 C) 98 F (36.7 C) 98.3 F (36.8 C) (!) 97.5 F (36.4 C)  TempSrc: Oral Oral Oral Oral  SpO2: 98% 98% 95% 97%  Weight:      Height:       No intake or output data in the 24 hours ending 09/09/18 1006 Filed Weights   09/05/18 2116  Weight: 86.1 kg   Exam Awake alert somewhat coherent can tell me that she is in [place] can tell me the date EOMI NCAT Is clinically clear S1-S2 no murmur rub or gallop Abdomen is soft no rebound no guarding Neurologically intact however may be some left-sided foot drop power is 5/5 otherwise smile is symmetric past-pointing not tested  Data Reviewed: I have personally reviewed following labs and imaging studies  CBC: Recent Labs  Lab 09/05/18 2038 09/05/18 2044 09/09/18 0429  WBC 13.2*  --  7.5  NEUTROABS 10.4*  --   --   HGB 14.6 16.0* 13.8  HCT 46.3* 47.0* 43.6  MCV 80.2  --  79.4*  PLT 274  --  286   Basic Metabolic Panel: Recent Labs  Lab 09/05/18 2038 09/05/18 2044 09/07/18 0805 09/08/18 1215 09/09/18 0429  NA 134* 139 138 140 140  K 3.1* 3.0* 3.1* 3.3* 3.4*  CL 103 107 110 111 109  CO2 19*  --  21* 20* 21*  GLUCOSE 129* 127* 120* 108* 106*  BUN 23 25* 18 19 17  CREATININE 1.51* 1.40* 1.08* 0.93 1.15*  CALCIUM 9.1  --  8.7* 8.5* 8.9   GFR: Estimated Creatinine Clearance: 37.3 mL/min (A) (by C-G formula based on SCr of 1.15 mg/dL (H)). Liver Function Tests: Recent Labs  Lab 09/05/18 2038  AST 32  ALT 19  ALKPHOS 84  BILITOT 1.1  PROT 7.0  ALBUMIN 4.0   No results for input(s): LIPASE, AMYLASE in the last 168 hours. No results for input(s): AMMONIA in the last 168 hours. Coagulation Profile: Recent Labs  Lab 09/05/18 2038  INR 1.06   Cardiac Enzymes: Recent Labs  Lab 09/06/18 0443  CKTOTAL 316*   BNP (last 3 results) No  results for input(s): PROBNP in the last 8760 hours. HbA1C: No results for input(s): HGBA1C in the last 72 hours. CBG: No results for input(s): GLUCAP in the last 168 hours. Lipid Profile: No results for input(s): CHOL, HDL, LDLCALC, TRIG, CHOLHDL, LDLDIRECT in the last 72 hours. Thyroid Function Tests: No results for input(s): TSH, T4TOTAL, FREET4, T3FREE, THYROIDAB in the last 72 hours. Anemia Panel: No results for input(s): VITAMINB12, FOLATE, FERRITIN, TIBC, IRON, RETICCTPCT in the last 72 hours. Urine analysis:    Component Value Date/Time   COLORURINE YELLOW 09/05/2018 2229   APPEARANCEUR HAZY (A) 09/05/2018 2229   LABSPEC >1.046 (H) 09/05/2018 2229   PHURINE 5.0 09/05/2018 2229   GLUCOSEU NEGATIVE 09/05/2018 2229   HGBUR SMALL (A) 09/05/2018 2229   BILIRUBINUR NEGATIVE 09/05/2018 2229   KETONESUR NEGATIVE 09/05/2018 2229   PROTEINUR NEGATIVE 09/05/2018 2229   UROBILINOGEN 0.2 05/03/2014 2024   NITRITE NEGATIVE 09/05/2018 2229   LEUKOCYTESUR SMALL (A) 09/05/2018 2229   Sepsis Labs: @LABRCNTIP (procalcitonin:4,lacticidven:4)  ) Recent Results (from the past 240 hour(s))  Culture, Urine     Status: Abnormal   Collection Time: 09/05/18 10:29 PM  Result Value Ref Range Status   Specimen Description URINE, RANDOM  Final   Special Requests ADDED 0134 09/06/18  Final   Culture MULTIPLE SPECIES PRESENT, SUGGEST RECOLLECTION (A)  Final   Report Status 09/07/2018 FINAL  Final      Radiology Studies: Vas Koreas Lower Extremity Venous (dvt)  Result Date: 09/07/2018  Lower Venous Study Indications: Stroke.  Performing Technologist: Farrel DemarkJill Eunice RDMS, RVT  Examination Guidelines: A complete evaluation includes B-mode imaging, spectral Doppler, color Doppler, and power Doppler as needed of all accessible portions of each vessel. Bilateral testing is considered an integral part of a complete examination. Limited examinations for reoccurring indications may be performed as noted.  Right  Venous Findings: +---------+---------------+---------+-----------+----------+-------+          CompressibilityPhasicitySpontaneityPropertiesSummary +---------+---------------+---------+-----------+----------+-------+ CFV      Full           Yes      Yes                          +---------+---------------+---------+-----------+----------+-------+ SFJ      Full                                                 +---------+---------------+---------+-----------+----------+-------+ FV Prox  Full                                                 +---------+---------------+---------+-----------+----------+-------+  FV Mid   Full                                                 +---------+---------------+---------+-----------+----------+-------+ FV DistalFull                                                 +---------+---------------+---------+-----------+----------+-------+ PFV      Full                                                 +---------+---------------+---------+-----------+----------+-------+ POP      Full           Yes      Yes                          +---------+---------------+---------+-----------+----------+-------+ PTV      Full                                                 +---------+---------------+---------+-----------+----------+-------+ PERO     Full                                                 +---------+---------------+---------+-----------+----------+-------+  Left Venous Findings: +---------+---------------+---------+-----------+----------+-------+          CompressibilityPhasicitySpontaneityPropertiesSummary +---------+---------------+---------+-----------+----------+-------+ CFV      Full           Yes      Yes                          +---------+---------------+---------+-----------+----------+-------+ SFJ      Full                                                  +---------+---------------+---------+-----------+----------+-------+ FV Prox  Full                                                 +---------+---------------+---------+-----------+----------+-------+ FV Mid   Full                                                 +---------+---------------+---------+-----------+----------+-------+ FV DistalFull                                                 +---------+---------------+---------+-----------+----------+-------+  PFV      Full                                                 +---------+---------------+---------+-----------+----------+-------+ POP      Full           Yes      Yes                          +---------+---------------+---------+-----------+----------+-------+ PTV      Full                                                 +---------+---------------+---------+-----------+----------+-------+ PERO     Full                                                 +---------+---------------+---------+-----------+----------+-------+    Summary: Right: There is no evidence of deep vein thrombosis in the lower extremity. No cystic structure found in the popliteal fossa. Left: There is no evidence of deep vein thrombosis in the lower extremity. No cystic structure found in the popliteal fossa.  *See table(s) above for measurements and observations. Electronically signed by Lemar Livings MD on 09/07/2018 at 4:21:01 PM.    Final      Scheduled Meds: .  stroke: mapping our early stages of recovery book   Does not apply Once  . aspirin  300 mg Rectal Daily   Or  . aspirin  325 mg Oral Daily  . atorvastatin  40 mg Oral q1800  . clopidogrel  75 mg Oral Daily  . enoxaparin (LOVENOX) injection  30 mg Subcutaneous Q24H  . LORazepam  1 mg Intravenous Once  . pneumococcal 23 valent vaccine  0.5 mL Intramuscular Tomorrow-1000   Continuous Infusions:   LOS: 4 days time

## 2018-09-09 NOTE — Progress Notes (Signed)
Occupational Therapy Treatment Patient Details Name: Wendy LeavellLillian B Andrews MRN: [id number] DOB: 01/07/1930 Today's Date: 09/09/2018    History of present illness Wendy LeavellLillian B Andrews is a 84 y.o. female with medical history significant of HTN, HLD.  Patient has been having L sided weakness present since at least 11/15.  Exact time of onset unclear.  Patient was stumbling around at that time she says.  11/16, family hadnt heard from patient so they asked for a wellfare check.  EMS found patient on floor trying to get to door. Neurology impression: with moderate right MCA distribution infarct.   OT comments  Pt progressing towards OT goals, presents supine in bed pleasant and willing to participate in therapy session. Pt requiring minA for room level functional mobility using RW, performing standing grooming ADLs at sink with minA for balance throughout. Pt demonstrating improved attention to L visual field and incorporating LUE into fine motor portions of grooming tasks. Feel POC remains appropriate at this time. Will continue to follow acutely.    Follow Up Recommendations  CIR    Equipment Recommendations  Other (comment)(TBD in next venue)          Precautions / Restrictions Precautions Precautions: Fall Restrictions Weight Bearing Restrictions: No       Mobility Bed Mobility Overal bed mobility: Needs Assistance Bed Mobility: Supine to Sit     Supine to sit: Supervision;HOB elevated(Simultaneous filing. User may not have seen previous data.)     General bed mobility comments: in chair on arrival  Transfers Overall transfer level: Needs assistance Equipment used: 1 person hand held assist Transfers: Sit to/from Stand Sit to Stand: Min guard Stand pivot transfers: Min guard       General transfer comment: HHA for balance; no boosting required for 2x sit<>stand    Balance Overall balance assessment: Needs assistance Sitting-balance support: No upper extremity  supported;Feet supported Sitting balance-Leahy Scale: Good Sitting balance - Comments: supervision for sitting balance   Standing balance support: Single extremity supported;During functional activity Standing balance-Leahy Scale: Poor Standing balance comment: reliant on UE support for balance                           ADL either performed or assessed with clinical judgement   ADL Overall ADL's : Needs assistance/impaired     Grooming: Minimal assistance;Standing;Oral care Grooming Details (indicate cue type and reason): minA for standing balance; pt using forearm support on sink for increased balance                             Functional mobility during ADLs: Minimal assistance;Rolling walker General ADL Comments: pt with improved attention to L side; ambulated to sink for grooming ADLs prior to transfer to recliner; minA throughout; pt with good use of LUE during fine motor/bimanual UE portion of grooming task     Vision       Perception     Praxis      Cognition Arousal/Alertness: Awake/alert Behavior During Therapy: WFL for tasks assessed/performed Overall Cognitive Status: No family/caregiver present to determine baseline cognitive functioning Area of Impairment: Problem solving                   Current Attention Level: Sustained Memory: Decreased recall of precautions;Decreased short-term memory Following Commands: Follows one step commands consistently   Awareness: Emergent Problem Solving: Slow processing;Requires verbal cues General Comments: Slower processing but consistent  with command following; A&Ox4         Prior Functioning/Environment              Frequency  Min 2X/week        Progress Toward Goals  OT Goals(current goals can now be found in the care plan section)  Progress towards OT goals: Progressing toward goals  Acute Rehab OT Goals Patient Stated Goal: to get stronger OT Goal Formulation: With  patient Time For Goal Achievement: 09/21/18 Potential to Achieve Goals: Good ADL Goals Pt Will Perform Grooming: with modified independence;sitting Pt Will Perform Upper Body Dressing: with supervision;with set-up;sitting Pt Will Perform Lower Body Dressing: with set-up;with supervision;sit to/from stand Pt Will Transfer to Toilet: with supervision;ambulating;bedside commode Pt Will Perform Toileting - Clothing Manipulation and hygiene: with supervision;sit to/from stand Pt/caregiver will Perform Home Exercise Program: Left upper extremity;With written HEP provided;With theraband Additional ADL Goal #1: Pt will attend to L side during ADLs and functional transfers with supervision.  Plan Discharge plan remains appropriate    Co-evaluation                 AM-PAC PT "6 Clicks" Daily Activity     Outcome Measure   Help from another person eating meals?: A Little Help from another person taking care of personal grooming?: A Little Help from another person toileting, which includes using toliet, bedpan, or urinal?: A Lot Help from another person bathing (including washing, rinsing, drying)?: A Lot Help from another person to put on and taking off regular upper body clothing?: A Little Help from another person to put on and taking off regular lower body clothing?: A Lot 6 Click Score: 15    End of Session Equipment Utilized During Treatment: Gait belt;Rolling walker  OT Visit Diagnosis: Other abnormalities of gait and mobility (R26.89);Unsteadiness on feet (R26.81);Muscle weakness (generalized) (M62.81);Other symptoms and signs involving the nervous system (R29.898);Other symptoms and signs involving cognitive function;Hemiplegia and hemiparesis Hemiplegia - Right/Left: Left Hemiplegia - dominant/non-dominant: Non-Dominant Hemiplegia - caused by: Cerebral infarction   Activity Tolerance Patient tolerated treatment well   Patient Left in chair;with call bell/phone within  reach;with chair alarm set   Nurse Communication Mobility status        Time: 1610-9604 OT Time Calculation (min): 22 min  Charges: OT General Charges $OT Visit: 1 Visit OT Treatments $Self Care/Home Management : 8-22 mins  Marcy Siren, OT Supplemental Rehabilitation Services Pager [phone] Office [phone]    Orlando Penner 09/09/2018, 9:50 AM

## 2018-09-09 NOTE — Progress Notes (Signed)
Inpatient Rehabilitation-Admissions Coordinator   [facility] able to speak with pt's nephew at the bedside this AM. Her nephew, Maurine MinisterDennis, wound only be able to check in on her 1x/day at the most and does not feel that would be adequate support. Due to lack of caregiver support, AC would recommended a longer term post acute rehab, such as a SNF. Both pt and her nephew are in agreement for SNF. AC has communicated this to SW.   [facility] will sign off at this time. Please call if questions.   Nanine MeansKelly Fleur Audino, OTR/L  Rehab Admissions Coordinator  [phone] 09/09/2018 11:12 AM

## 2018-09-09 NOTE — Progress Notes (Signed)
  Speech Language Pathology Treatment: Cognitive-Linquistic  Patient Details Name: Wendy Andrews MRN: [id number] DOB: 12/01/1929 Today's Date: 09/09/2018 Time: [id number] SLP Time Calculation (min) (ACUTE ONLY): 15 min  Assessment / Plan / Recommendation Clinical Impression  Pt was seen for skilled ST targeting cognitive goals.  Upon arrival, pt was sitting in recliner.  She needed min cues to recall activities from previous therapy session and max cues to identify current deficits and their impact on her functional independence in the home environment.  Discussed the need for additional therapies at discharge as well as 24/7 supervision.  Pt verifies that her family will be unable to provide recommended level of assistance but is upset at the prospect of having to go to a skilled nursing facility.  SLP provided reassurance and encouragement.  Pt was left in recliner with all needs within reach.  Continue per current plan of care.     HPI HPI: Wendy LeavellLillian B Andrews is a 84 y.o. female with medical history significant of HTN, HLD. Patien lives alone and family was unable to contact. A wellfare check was done and EMS found her down trying to get to door. She has L sided weakness and left sided neglect. MRI showed an acute posterior righ MCA territor infarct.       SLP Plan  Continue with current plan of care       Recommendations                   Follow up Recommendations: Skilled Nursing facility SLP Visit Diagnosis: Cognitive communication deficit (Y78.295(R41.841) Plan: Continue with current plan of care       GO                Whitman Meinhardt, Melanee Spryicole L 09/09/2018, 3:29 PM

## 2018-09-09 NOTE — NC FL2 (Signed)
[place] MEDICAID FL2 LEVEL OF CARE SCREENING TOOL     IDENTIFICATION  Patient Name: Wendy LeavellLillian B Balinski Birthdate: 03/04/1930 Sex: female Admission Date (Current Location): 09/05/2018  [facility] [place]   Facility and Address:  [facility], [address], [place], [place] [address]      Provider Number: [id number]  Attending Physician Name and Address:  Rhetta MuraSamtani, Jai-Gurmukh, MD  Relative Name and Phone Number:       Current Level of Care: Hospital Recommended Level of Care: Skilled Nursing Facility Prior Approval Number:    Date Approved/Denied:   PASRR Number: Manual review  Discharge Plan: SNF    Current Diagnoses: Patient Active Problem List   Diagnosis Date Noted  . Hypokalemia   . Leukocytosis   . Interstitial cystitis   . Acute ischemic right MCA stroke (HCC) 09/05/2018  . HTN (hypertension) 09/05/2018  . Hypothyroidism 09/05/2018  . HLD (hyperlipidemia) 09/05/2018  . S/P laparoscopic cholecystectomy Feb 2015 12/12/2013    Orientation RESPIRATION BLADDER Height & Weight     Self, Time, Situation, Place  Normal Continent Weight: 189 lb 13.1 oz (86.1 kg) Height:  5\' 4"  (162.6 cm)  BEHAVIORAL SYMPTOMS/MOOD NEUROLOGICAL BOWEL NUTRITION STATUS      Continent Diet(heart healthy)  AMBULATORY STATUS COMMUNICATION OF NEEDS Skin   Limited Assist Verbally Normal                       Personal Care Assistance Level of Assistance  Bathing, Feeding, Dressing Bathing Assistance: Limited assistance Feeding assistance: Independent Dressing Assistance: Limited assistance     Functional Limitations Info  Sight, Hearing, Speech Sight Info: Adequate Hearing Info: Adequate Speech Info: Adequate    SPECIAL CARE FACTORS FREQUENCY  PT (By licensed PT), OT (By licensed OT)     PT Frequency: 5x/wk OT Frequency: 5x/wk            Contractures Contractures Info: Not present    Additional Factors Info  Code  Status, Allergies Code Status Info: Full Allergies Info: Tetanus Toxoids, Penicillins           Current Medications (09/09/2018):  This is the current hospital active medication list Current Facility-Administered Medications  Medication Dose Route Frequency Provider Last Rate Last Dose  .  stroke: mapping our early stages of recovery book   Does not apply Once Nahser, Deloris PingPhilip J, MD      . acetaminophen (TYLENOL) tablet 650 mg  650 mg Oral Q4H PRN Nahser, Deloris PingPhilip J, MD       Or  . acetaminophen (TYLENOL) solution 650 mg  650 mg Per Tube Q4H PRN Nahser, Deloris PingPhilip J, MD       Or  . acetaminophen (TYLENOL) suppository 650 mg  650 mg Rectal Q4H PRN Nahser, Deloris PingPhilip J, MD      . aspirin suppository 300 mg  300 mg Rectal Daily Nahser, Deloris PingPhilip J, MD       Or  . aspirin tablet 325 mg  325 mg Oral Daily Nahser, Deloris PingPhilip J, MD   325 mg at 09/08/18 1242  . atorvastatin (LIPITOR) tablet 40 mg  40 mg Oral q1800 Nahser, Deloris PingPhilip J, MD   40 mg at 09/08/18 1841  . clopidogrel (PLAVIX) tablet 75 mg  75 mg Oral Daily Nahser, Deloris PingPhilip J, MD   75 mg at 09/08/18 1242  . enoxaparin (LOVENOX) injection 30 mg  30 mg Subcutaneous Q24H Nahser, Deloris PingPhilip J, MD   30 mg at  09/08/18 1242  . LORazepam (ATIVAN) injection 1 mg  1 mg Intravenous Once Nahser, Deloris Ping, MD      . pneumococcal 23 valent vaccine (PNU-IMMUNE) injection 0.5 mL  0.5 mL Intramuscular Tomorrow-1000 Nahser, Deloris Ping, MD         Discharge Medications: Please see discharge summary for a list of discharge medications.  Relevant Imaging Results:  Relevant Lab Results:   Additional Information SS#: [id number]  Baldemar Lenis, LCSW

## 2018-09-09 NOTE — [place] FL2 (Signed)
[place] MEDICAID FL2 LEVEL OF CARE SCREENING TOOL     IDENTIFICATION  Patient Name: Wendy Andrews Birthdate: Nov 14, 1929 Sex: female Admission Date (Current Location): 09/05/2018  [facility] and [place] Number:  Producer, television/film/video and Address:  [facility], [address] [place], [place] [address]      Provider Number: [id number]  Attending Physician Name and Address:  Rhetta Mura, MD  Relative Name and Phone Number:  Eulah Pont, nephew, [phone]    Current Level of Care: Hospital Recommended Level of Care: Skilled Nursing Facility Prior Approval Number:    Date Approved/Denied:   PASRR Number: [id number]  Discharge Plan: SNF    Current Diagnoses: Patient Active Problem List   Diagnosis Date Noted  . Hypokalemia   . Leukocytosis   . Interstitial cystitis   . Acute ischemic right MCA stroke (HCC) 09/05/2018  . HTN (hypertension) 09/05/2018  . Hypothyroidism 09/05/2018  . HLD (hyperlipidemia) 09/05/2018  . S/P laparoscopic cholecystectomy Feb 2015 12/12/2013    Orientation RESPIRATION BLADDER Height & Weight     Self, Time, Situation, Place  Normal Continent Weight: 189 lb 13.1 oz (86.1 kg) Height:  5\' 4"  (162.6 cm)  BEHAVIORAL SYMPTOMS/MOOD NEUROLOGICAL BOWEL NUTRITION STATUS      Continent Diet(Heart Healthy)  AMBULATORY STATUS COMMUNICATION OF NEEDS Skin   Independent Verbally Normal                       Personal Care Assistance Level of Assistance  Bathing, Feeding, Dressing Bathing Assistance: Limited assistance Feeding assistance: Limited assistance Dressing Assistance: Limited assistance     Functional Limitations Info  Hearing, Sight, Speech Sight Info: Adequate Hearing Info: Adequate Speech Info: Adequate    SPECIAL CARE FACTORS FREQUENCY  PT (By licensed PT), OT (By licensed OT)     PT Frequency: 5x/wk OT Frequency: 5x/wk            Contractures Contractures Info: Not present     Additional Factors Info  Code Status, Allergies Code Status Info: Full Code Allergies Info: Tetanus Toxoids, Penicillins           Current Medications (09/09/2018):  This is the current hospital active medication list Current Facility-Administered Medications  Medication Dose Route Frequency Provider Last Rate Last Dose  .  stroke: mapping our early stages of recovery book   Does not apply Once Nahser, Deloris Ping, MD      . acetaminophen (TYLENOL) tablet 650 mg  650 mg Oral Q4H PRN Nahser, Deloris Ping, MD       Or  . acetaminophen (TYLENOL) solution 650 mg  650 mg Per Tube Q4H PRN Nahser, Deloris Ping, MD       Or  . acetaminophen (TYLENOL) suppository 650 mg  650 mg Rectal Q4H PRN Nahser, Deloris Ping, MD      . aspirin suppository 300 mg  300 mg Rectal Daily Nahser, Deloris Ping, MD       Or  . aspirin tablet 325 mg  325 mg Oral Daily Nahser, Deloris Ping, MD   325 mg at 09/09/18 1015  . atorvastatin (LIPITOR) tablet 40 mg  40 mg Oral q1800 Nahser, Deloris Ping, MD   40 mg at 09/08/18 1841  . clopidogrel (PLAVIX) tablet 75 mg  75 mg Oral Daily Nahser, Deloris Ping, MD   75 mg at 09/09/18 1016  . [START ON 09/10/2018] enoxaparin (LOVENOX) injection 40 mg  40 mg Subcutaneous Q24H Rhetta Mura, MD      .  levothyroxine (SYNTHROID, LEVOTHROID) tablet 75 mcg  75 mcg Oral Q0600 Rhetta MuraSamtani, Jai-Gurmukh, MD   75 mcg at 09/09/18 1103  . LORazepam (ATIVAN) injection 1 mg  1 mg Intravenous Once Nahser, Deloris PingPhilip J, MD      . traZODone (DESYREL) tablet 150 mg  150 mg Oral QHS Rhetta MuraSamtani, Jai-Gurmukh, MD         Discharge Medications: Please see discharge summary for a list of discharge medications.  Relevant Imaging Results:  Relevant Lab Results:   Additional Information SSN: [id number]  Nada BoozerCaitlin B Cyana Shook, LCSWA

## 2018-09-09 NOTE — Clinical Social Work Note (Signed)
Clinical Social Work Assessment  Patient Details  Name: Wendy Andrews MRN: [id number] Date of Birth: 14-Oct-1930  Date of referral:  09/09/18               Reason for consult:  Discharge Planning, Facility Placement                Permission sought to share information with:  Family Supports, Customer service manager Permission granted to share information::  Yes, Verbal Permission Granted  Name::     Faye Ramsay   Agency::  SNF  Relationship::  [employer] Information:  Faye Ramsay, nephew, [phone]  Housing/Transportation Living arrangements for the past 2 months:  [place] of Information:  Patient, Medical Team, Other (Comment Required)(family) Patient Interpreter Needed:  None Criminal Activity/Legal Involvement Pertinent to Current Situation/Hospitalization:  No - Comment as needed Significant Relationships:  Other Family Members(Adult nephew) Lives with:  Self Do you feel safe going back to the place where you live?  Yes Need for family participation in patient care:  Yes (Comment)  Care giving concerns:  Patient is coming from home but doesn't have support and will need rehab.    Social Worker assessment / plan:  CSW met with patient and family member at bedside. Patient would like to go to [facility]. [facility] is not within the network. CSW will check to see if she has out of network benefits. CSW will need to speak with the family about possible SNF facilities that do take the patient's insurance.    Employment status:  Retired Nurse, adult PT Recommendations:  [place] / Referral to community resources:  [place]  Patient/Family's Response to care: Patient would like to go to [facility]. CSW will need to check to see if the patient is eligible to go. CSW will gather a list of SNF facilities and discuss with the patient and the family.   Patient/Family's Understanding  of and Emotional Response to Diagnosis, Current Treatment, and Prognosis: Family understands what is going on with the patient. Patient's nephew has limited ability to help care for the patient. He would like to be made aware of possible SNF placements for the patient. The patient does not have POA.   Emotional Assessment Appearance:  Appears stated age Attitude/Demeanor/Rapport:  Unable to Assess Affect (typically observed):  Calm Orientation:  Oriented to Self, Oriented to Situation, Oriented to Place, Oriented to  Time Alcohol / Substance use:  Not Applicable Psych involvement (Current and /or in the community):     Discharge Needs  Concerns to be addressed:  Discharge Planning Concerns Readmission within the last 30 days:  No Current discharge risk:  Lives alone, Dependent with Mobility, Physical Impairment, Cognitively Impaired Barriers to Discharge:  Continued Medical Work up, Ship broker, Programmer, applications ([place])   [place], LCSWA 09/09/2018, 11:19 AM

## 2018-09-10 MED ORDER — HYDRALAZINE HCL 20 MG/ML IJ SOLN: 10.0000 mg | Freq: Four times a day (QID) | INTRAMUSCULAR | Status: DC | PRN

## 2018-09-10 NOTE — Progress Notes (Signed)
Physical Therapy Treatment Patient Details Name: Wendy LeavellLillian B Andrews MRN: [id number] DOB: 09/26/1930 Today's Date: 09/10/2018    History of Present Illness Pt is an 84 y.o. female admitted 09/05/18 with L-sided weakness; family had not heard from pt, so EMS performed welfare check and found pt down at home. CT showed acute posterior R MCA territory infarct; chronic small vessel disease. 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 Pt s/p TEE and loop recorder insertion 11/19. PMH includes HTN, HLD.   PT Comments     Pt progressing with mobility. Pt continues to state "I'm ready to go home," with attempts to reorient to situation and need for rehab; demonstrates poor insight into deficits and decreased safety awareness overall. Able to perform standing ADL tasks and ambulation with min-modA and UE support to maintain balance. Pt with slow, shuffling gait; continued deficits in LLE/LUE strength and coordination. Pt determined not to have 24/7 support available upon discharge; will plan to d/c to SNF for post-acute rehab.   Follow Up Recommendations  SNF;Supervision/Assistance - 24 hour(CIR declined- does not have 24/7 support)     Equipment Recommendations  (TBD next venue)    Recommendations for Other Services       Precautions / Restrictions Precautions Precautions: Fall Restrictions Weight Bearing Restrictions: No    Mobility  Bed Mobility Overal bed mobility: Needs Assistance Bed Mobility: Supine to Sit     Supine to sit: Supervision;HOB elevated     General bed mobility comments: Increased time and effort; pulling on side of mattress to assist rolling  Transfers Overall transfer level: Needs assistance Equipment used: 1 person hand held assist Transfers: Sit to/from Stand Sit to Stand: Min assist         General transfer comment: Stood initially without UE support and immediately losing balance with poorly controlled descent to  sitting; stood again with HHA and minA to maintain balance  Ambulation/Gait Ambulation/Gait assistance: Mod assist   Assistive device: 1 person hand held assist;Rolling walker (2 wheeled) Gait Pattern/deviations: Step-through pattern;Decreased stride length;Shuffle;Decreased dorsiflexion - left;Trunk flexed Gait velocity: Decreased Gait velocity interpretation: <1.31 ft/sec, indicative of household ambulator General Gait Details: Short, shuffling amb with single HHA and consistent min-modA to maintain balance; able to somewhat increase LLE clearance with cues, but overcompensating with this requiring modA to correct LOB. Gait mechanics not improved with RW use   Stairs             Wheelchair Mobility    Modified Rankin (Stroke Patients Only) Modified Rankin (Stroke Patients Only) Pre-Morbid Rankin Score: No symptoms Modified Rankin: Moderately severe disability     Balance Overall balance assessment: Needs assistance Sitting-balance support: No upper extremity supported;Feet supported Sitting balance-Leahy Scale: Good Sitting balance - Comments: supervision for sitting balance   Standing balance support: Single extremity supported;During functional activity Standing balance-Leahy Scale: Poor Standing balance comment: Prolonged standing at sink for standing ADL tasks; pt leaning forward with abdomen resting on sink for balance, when cued to lean back, required UE support to maintain balance                            Cognition Arousal/Alertness: Awake/alert Behavior During Therapy: Flat affect Overall Cognitive Status: No family/caregiver present to determine baseline cognitive functioning Area of Impairment: Attention;Following commands;Safety/judgement;Awareness;Problem solving                   Current Attention Level: Sustained   Following Commands: Follows  one step commands consistently;Follows multi-step commands  inconsistently Safety/Judgement: Decreased awareness of safety;Decreased awareness of deficits     General Comments: Pt stating "I want to go home" multiple times, able to somewhat be redirected with educ on why she needs rehab before returning home. Poor insight into deficits. A&Ox4      Exercises      General Comments General comments (skin integrity, edema, etc.): Poor fine motor control with BUE, especially with ADL tasks at sink (washing dentures)      Pertinent Vitals/Pain Pain Assessment: No/denies pain    Home Living                      Prior Function            PT Goals (current goals can now be found in the care plan section) Acute Rehab PT Goals Patient Stated Goal: "I'm ready to go home" PT Goal Formulation: With patient Time For Goal Achievement: 09/20/18 Potential to Achieve Goals: Fair Progress towards PT goals: Progressing toward goals    Frequency    Min 4X/week      PT Plan Current plan remains appropriate    Co-evaluation              AM-PAC PT "6 Clicks" Daily Activity  Outcome Measure  Difficulty turning over in bed (including adjusting bedclothes, sheets and blankets)?: A Little Difficulty moving from lying on back to sitting on the side of the bed? : A Little Difficulty sitting down on and standing up from a chair with arms (e.g., wheelchair, bedside commode, etc,.)?: Unable Help needed moving to and from a bed to chair (including a wheelchair)?: A Little Help needed walking in hospital room?: A Little Help needed climbing 3-5 steps with a railing? : A Lot 6 Click Score: 15    End of Session Equipment Utilized During Treatment: Gait belt Activity Tolerance: Patient tolerated treatment well Patient left: in chair;with call bell/phone within reach;with chair alarm set Nurse Communication: Mobility status PT Visit Diagnosis: Unsteadiness on feet (R26.81);Other abnormalities of gait and mobility (R26.89);Hemiplegia and  hemiparesis Hemiplegia - Right/Left: Left Hemiplegia - dominant/non-dominant: Non-dominant Hemiplegia - caused by: Cerebral infarction     Time: 1610-9604 PT Time Calculation (min) (ACUTE ONLY): 31 min  Charges:  $Gait Training: 8-22 mins $Therapeutic Activity: 8-22 mins                    Ina Homes, PT, DPT Acute Rehabilitation Services  Pager [phone] Office [phone]  Malachy Chamber 09/10/2018, 2:46 PM

## 2018-09-10 NOTE — Progress Notes (Signed)
PROGRESS NOTE    Wendy LeavellLillian B Andrews  UXL:[id number] DOB: 04/28/1930 DOA: 09/05/2018 PCP: Burton Apleyoberts, Ronald, MD   Brief Narrative:   84 y.o. femalef HTN, HLD as well as cholecystectomy 2015 Presented to the emergency room 11/16 with 1 day history L sided weakness present since at least yesterday.    Patient was stumbling around yesterday she says.  Today family hadnt heard from patient so they asked for a wellfare check.  EMS found patient on floor trying to get to door.  Interim history Admitted for acute CVA. Neurology consulted. Pending workup- pending TEE and loop recorder. Possibly CIR. Assessment & Plan   Acute CVA -MRI showed MCA territory infarct CT a positive for LVO echo 66 5% LDL 165 A1c 5.3- Neurology recommended Plavix aspirin for 3 months followed by aspirin alone TEE showed no thrombus or patient [place] Loop recorder was placed 11/19  Asymptomatic bacteriuria  -UA rare bacteria, 21-50 WBC, negative nitrites, small leukocytes Urine analysis showed multiple colony-forming units- -Antibiotics currently held for now as patient does not have any complaints.  She is currently afebrile  Essential hypertension -Home medications held, allowed for permissive hypertension -will restart lisinopril and gradually normalize BP in the next sevreal days -PRN added for SYS bp over 200  Hyperlipidemia -Continue statin  Hypothyroidism - synthroid 75 mg daily -recheck TSH 1 mo  DVT Prophylaxis  lovenox  Code Status: Full  Family Communication: none at bedside  Disposition Plan: Admitted, pending TEE/loop.  Await SNF auth Consultants Neurology Inpatient rehab  Cardiology   Procedures  Echocardiogram Lower extremity doppler  Antibiotics   Anti-infectives (From admission, onward)   None      Subjective:   Awake pleasant in nad  no weakness no cp no fever  Objective:   Vitals:   09/09/18 2339 09/10/18 0330 09/10/18 0809 09/10/18 1212  BP: (!) 150/70 (!)  149/77 (!) 166/77 (!) 160/87  Pulse: 75 77 75 88  Resp: 18 18 15 15   Temp: 98.1 F (36.7 C) 98 F (36.7 C) 98.1 F (36.7 C) 97.9 F (36.6 C)  TempSrc: Oral Oral Oral Oral  SpO2: 96% 98% 96% 95%  Weight:      Height:       No intake or output data in the 24 hours ending 09/10/18 1347 Filed Weights   09/05/18 2116  Weight: 86.1 kg   Exam No over change from prior exam  Awake alert somewhat coherent can tell me that [place] can tell me the date EOMI NCAT Is clinically clear S1-S2 no murmur rub or gallop Abdomen is soft no rebound no guarding Neurologically intact however may be some left-sided foot drop power is 5/5 otherwise smile is symmetric past-pointing not tested  Data Reviewed: I have personally reviewed following labs and imaging studies  CBC: Recent Labs  Lab 09/05/18 2038 09/05/18 2044 09/09/18 0429  WBC 13.2*  --  7.5  NEUTROABS 10.4*  --   --   HGB 14.6 16.0* 13.8  HCT 46.3* 47.0* 43.6  MCV 80.2  --  79.4*  PLT 274  --  286   Basic Metabolic Panel: Recent Labs  Lab 09/05/18 2038 09/05/18 2044 09/07/18 0805 09/08/18 1215 09/09/18 0429  NA 134* 139 138 140 140  K 3.1* 3.0* 3.1* 3.3* 3.4*  CL 103 107 110 111 109  CO2 19*  --  21* 20* 21*  GLUCOSE 129* 127* 120* 108* 106*  BUN 23 25* 18 19 17   CREATININE 1.51*  1.40* 1.08* 0.93 1.15*  CALCIUM 9.1  --  8.7* 8.5* 8.9   GFR: Estimated Creatinine Clearance: 37.3 mL/min (A) (by C-G formula based on SCr of 1.15 mg/dL (H)). Liver Function Tests: Recent Labs  Lab 09/05/18 2038  AST 32  ALT 19  ALKPHOS 84  BILITOT 1.1  PROT 7.0  ALBUMIN 4.0   No results for input(s): LIPASE, AMYLASE in the last 168 hours. No results for input(s): AMMONIA in the last 168 hours. Coagulation Profile: Recent Labs  Lab 09/05/18 2038  INR 1.06   Cardiac Enzymes: Recent Labs  Lab 09/06/18 0443  CKTOTAL 316*   BNP (last 3 results) No results for input(s): PROBNP in the last 8760  hours. HbA1C: No results for input(s): HGBA1C in the last 72 hours. CBG: No results for input(s): GLUCAP in the last 168 hours. Lipid Profile: No results for input(s): CHOL, HDL, LDLCALC, TRIG, CHOLHDL, LDLDIRECT in the last 72 hours. Thyroid Function Tests: No results for input(s): TSH, T4TOTAL, FREET4, T3FREE, THYROIDAB in the last 72 hours. Anemia Panel: No results for input(s): VITAMINB12, FOLATE, FERRITIN, TIBC, IRON, RETICCTPCT in the last 72 hours. Urine analysis:    Component Value Date/Time   COLORURINE YELLOW 09/05/2018 2229   APPEARANCEUR HAZY (A) 09/05/2018 2229   LABSPEC >1.046 (H) 09/05/2018 2229   PHURINE 5.0 09/05/2018 2229   GLUCOSEU NEGATIVE 09/05/2018 2229   HGBUR SMALL (A) 09/05/2018 2229   BILIRUBINUR NEGATIVE 09/05/2018 2229   KETONESUR NEGATIVE 09/05/2018 2229   PROTEINUR NEGATIVE 09/05/2018 2229   UROBILINOGEN 0.2 05/03/2014 2024   NITRITE NEGATIVE 09/05/2018 2229   LEUKOCYTESUR SMALL (A) 09/05/2018 2229   Sepsis Labs: @LABRCNTIP (procalcitonin:4,lacticidven:4)  ) Recent Results (from the past 240 hour(s))  Culture, Urine     Status: Abnormal   Collection Time: 09/05/18 10:29 PM  Result Value Ref Range Status   Specimen Description URINE, RANDOM  Final   Special Requests ADDED 0134 09/06/18  Final   Culture MULTIPLE SPECIES PRESENT, SUGGEST RECOLLECTION (A)  Final   Report Status 09/07/2018 FINAL  Final      Radiology Studies: No results found.   Scheduled Meds: .  stroke: mapping our early stages of recovery book   Does not apply Once  . aspirin  300 mg Rectal Daily   Or  . aspirin  325 mg Oral Daily  . atorvastatin  40 mg Oral q1800  . clopidogrel  75 mg Oral Daily  . enoxaparin (LOVENOX) injection  40 mg Subcutaneous Q24H  . levothyroxine  75 mcg Oral Q0600  . LORazepam  1 mg Intravenous Once  . traZODone  150 mg Oral QHS   Continuous Infusions:   LOS: 5 days time

## 2018-09-11 NOTE — Progress Notes (Signed)
No changes over several days  This am oob and sitting in chair Likely will d/c when SNF available   Pleas KochJai Rhett Najera, MD [facility] 2:50 PM

## 2018-09-11 NOTE — Plan of Care (Signed)
  Problem: Activity: Goal: Risk for activity intolerance will decrease Outcome: Progressing   Problem: Clinical Measurements: Goal: Ability to maintain clinical measurements within normal limits will improve Outcome: Progressing   

## 2018-09-12 MED ORDER — LISINOPRIL 20 MG PO TABS
20.0000 mg | ORAL_TABLET | Freq: Every day | ORAL | Status: DC
  Administered 2018-09-12 – 2018-09-15 (×4): 20 mg via ORAL
  Filled 2018-09-12 (×4): qty 1

## 2018-09-12 NOTE — Clinical Social Work Note (Signed)
Insurance authorization still pending.  Broox Lonigro, CSW [phone]  

## 2018-09-12 NOTE — Progress Notes (Signed)
PROGRESS NOTE    Wendy Andrews  ZOX:[id number] DOB: 09-Nov-1929 DOA: 09/05/2018 PCP: Burton Apley, MD   Brief Narrative:   84 y.o. femalef HTN, HLD as well as cholecystectomy 2015 Presented to the emergency room 11/16 with 1 day history L sided weakness present since at least yesterday.    Patient was stumbling around yesterday she says.  Today family hadnt heard from patient so they asked for a wellfare check.  EMS found patient on floor trying to get to door.  Interim history Admitted for acute CVA. Neurology consulted. Pending workup- pending TEE and loop recorder. Possibly CIR. Assessment & Plan   Acute CVA -MRI showed MCA territory infarct CT a positive for LVO echo 66 5% LDL 165 A1c 5.3- Neurology recommended Plavix aspirin for 3 months followed by aspirin alone TEE showed no thrombus or patient foreman ovale Loop recorder was placed 11/19  Asymptomatic bacteriuria  -UA rare bacteria, 21-50 WBC, negative nitrites, small leukocytes Urine analysis showed multiple colony-forming units- -Antibiotics currently held   Essential hypertension -Home medications held, allowed for permissive hypertension -restarted lisinopril 20--watch trends -PRN added for SYS bp over 200  Hyperlipidemia -Continue statin  Hypothyroidism - synthroid 75 mg daily -recheck TSH 1 mo  DVT Prophylaxis  lovenox  Code Status: Full  Family Communication: none at bedside  Disposition Plan: Admitted, TEE/loop.  Await SNF auth Consultants Neurology Inpatient rehab  Cardiology   Procedures  Echocardiogram Lower extremity doppler  Antibiotics   Anti-infectives (From admission, onward)   None      Subjective:   Sleeping in chair No new c/o  Objective:   Vitals:   09/11/18 2007 09/11/18 2340 09/12/18 0343 09/12/18 1111  BP: (!) 169/82 (!) 145/65 140/81 (!) 161/73  Pulse: 67 65 77 63  Resp: 18 18 18 16   Temp: 98.7 F (37.1 C) 98.5 F (36.9 C) 98.3 F (36.8 C) 97.7 F (36.5  C)  TempSrc: Oral Oral Oral Oral  SpO2: 95% 97% 94% 95%  Weight:      Height:        Intake/Output Summary (Last 24 hours) at 09/12/2018 1504 Last data filed at 09/11/2018 1811 Gross per 24 hour  Intake 240 ml  Output -  Net 240 ml   Filed Weights   09/05/18 2116  Weight: 86.1 kg   Exam  Awake alert coherent calm EOMI NCAT Chest clear S1-S2 no murmur rub or gallop Abdomen is soft no rebound no guarding Neurologically intact however may be some left-sided foot drop power is 5/5 otherwise smile is symmetric past-pointing not tested  Data Reviewed: I have personally reviewed following labs and imaging studies  CBC: Recent Labs  Lab 09/05/18 2038 09/05/18 2044 09/09/18 0429  WBC 13.2*  --  7.5  NEUTROABS 10.4*  --   --   HGB 14.6 16.0* 13.8  HCT 46.3* 47.0* 43.6  MCV 80.2  --  79.4*  PLT 274  --  286   Basic Metabolic Panel: Recent Labs  Lab 09/05/18 2038 09/05/18 2044 09/07/18 0805 09/08/18 1215 09/09/18 0429  NA 134* 139 138 140 140  K 3.1* 3.0* 3.1* 3.3* 3.4*  CL 103 107 110 111 109  CO2 19*  --  21* 20* 21*  GLUCOSE 129* 127* 120* 108* 106*  BUN 23 25* 18 19 17   CREATININE 1.51* 1.40* 1.08* 0.93 1.15*  CALCIUM 9.1  --  8.7* 8.5* 8.9   GFR: Estimated Creatinine Clearance: 37.3 mL/min (A) (by C-G formula based on SCr  of 1.15 mg/dL (H)). Liver Function Tests: Recent Labs  Lab 09/05/18 2038  AST 32  ALT 19  ALKPHOS 84  BILITOT 1.1  PROT 7.0  ALBUMIN 4.0   No results for input(s): LIPASE, AMYLASE in the last 168 hours. No results for input(s): AMMONIA in the last 168 hours. Coagulation Profile: Recent Labs  Lab 09/05/18 2038  INR 1.06   Cardiac Enzymes: Recent Labs  Lab 09/06/18 0443  CKTOTAL 316*   BNP (last 3 results) No results for input(s): PROBNP in the last 8760 hours. HbA1C: No results for input(s): HGBA1C in the last 72 hours. CBG: No results for input(s): GLUCAP in the last 168 hours. Lipid Profile: No results for  input(s): CHOL, HDL, LDLCALC, TRIG, CHOLHDL, LDLDIRECT in the last 72 hours. Thyroid Function Tests: No results for input(s): TSH, T4TOTAL, FREET4, T3FREE, THYROIDAB in the last 72 hours. Anemia Panel: No results for input(s): VITAMINB12, FOLATE, FERRITIN, TIBC, IRON, RETICCTPCT in the last 72 hours. Urine analysis:    Component Value Date/Time   COLORURINE YELLOW 09/05/2018 2229   APPEARANCEUR HAZY (A) 09/05/2018 2229   LABSPEC >1.046 (H) 09/05/2018 2229   PHURINE 5.0 09/05/2018 2229   GLUCOSEU NEGATIVE 09/05/2018 2229   HGBUR SMALL (A) 09/05/2018 2229   BILIRUBINUR NEGATIVE 09/05/2018 2229   KETONESUR NEGATIVE 09/05/2018 2229   PROTEINUR NEGATIVE 09/05/2018 2229   UROBILINOGEN 0.2 05/03/2014 2024   NITRITE NEGATIVE 09/05/2018 2229   LEUKOCYTESUR SMALL (A) 09/05/2018 2229   Sepsis Labs: @LABRCNTIP (procalcitonin:4,lacticidven:4)  ) Recent Results (from the past 240 hour(s))  Culture, Urine     Status: Abnormal   Collection Time: 09/05/18 10:29 PM  Result Value Ref Range Status   Specimen Description URINE, RANDOM  Final   Special Requests ADDED 0134 09/06/18  Final   Culture MULTIPLE SPECIES PRESENT, SUGGEST RECOLLECTION (A)  Final   Report Status 09/07/2018 FINAL  Final      Radiology Studies: No results found.   Scheduled Meds: .  stroke: mapping our early stages of recovery book   Does not apply Once  . aspirin  300 mg Rectal Daily   Or  . aspirin  325 mg Oral Daily  . atorvastatin  40 mg Oral q1800  . clopidogrel  75 mg Oral Daily  . enoxaparin (LOVENOX) injection  40 mg Subcutaneous Q24H  . levothyroxine  75 mcg Oral Q0600  . LORazepam  1 mg Intravenous Once  . traZODone  150 mg Oral QHS   Continuous Infusions:   LOS: 7 days time

## 2018-09-13 NOTE — Progress Notes (Signed)
PROGRESS NOTE    Wendy LeavellLillian B Trickey  ZOX:[id number] DOB: 09/29/1930 DOA: 09/05/2018 PCP: Burton Apleyoberts, Ronald, MD   Brief Narrative:   84 y.o. femalef HTN, HLD as well as cholecystectomy 2015 Presented to the emergency room 11/16 with 1 day history L sided weakness present since at least yesterday.    Patient was stumbling around yesterday she says.  Today family hadnt heard from patient so they asked for a wellfare check.  EMS found patient on floor trying to get to door.  Assessment & Plan   Acute CVA -MRI showed MCA territory infarct CT a positive for LVO echo 66 5% LDL 165 A1c 5.3- Neurology recommended Plavix aspirin for 3 months followed by aspirin alone TEE showed no thrombus or patient foreman ov[place] Loop recorder was placed 11/19 Stable for past several days awaitign palcement  Asymptomatic bacteriuria  -UA rare bacteria, 21-50 WBC, negative nitrites, small leukocytes Urine analysis showed multiple colony-forming units- -Antibiotics currently held   Essential hypertension -Home medications held, allowed for permissive hypertension -restarted lisinopril 20--watch trends -PRN added for SYS bp over 200  Hyperlipidemia -Continue statin  Hypothyroidism - synthroid 75 mg daily -recheck TSH 1 mo  DVT Prophylaxis  lovenox  Code Status: Full  Family Communication: none at bedside  Disposition Plan: Admitted, TEE/loop.  Await SNF auth Consultants Neurology Inpatient rehab  Cardiology   Procedures  Echocardiogram Lower extremity doppler  Antibiotics   Anti-infectives (From admission, onward)   None      Subjective:   Eating lucn in chair no complaints  Objective:   Vitals:   09/12/18 2352 09/13/18 0333 09/13/18 0912 09/13/18 1305  BP: (!) 147/73 (!) 192/98 (!) 147/75 (!) 157/67  Pulse: 69 91 73 63  Resp: 16 18 16 18   Temp: (!) 97.3 F (36.3 C) 98.2 F (36.8 C) 97.6 F (36.4 C) 97.9 F (36.6 C)  TempSrc: Oral Oral Oral Oral  SpO2: 96% 98% 97% 97%    Weight:      Height:        Intake/Output Summary (Last 24 hours) at 09/13/2018 1306 Last data filed at 09/13/2018 0900 Gross per 24 hour  Intake 600 ml  Output -  Net 600 ml   Filed Weights   09/05/18 2116  Weight: 86.1 kg   Exam  Awake alert coherent calm EOMI NCAT Chest clear S1-S2 no murmur rub or gallop Abdomen is soft no rebound no guarding Neurologically intact however may be some left-sided foot drop power is 5/5 otherwise smile is symmetric past-pointing not tested  Data Reviewed: I have personally reviewed following labs and imaging studies  CBC: Recent Labs  Lab 09/09/18 0429  WBC 7.5  HGB 13.8  HCT 43.6  MCV 79.4*  PLT 286   Basic Metabolic Panel: Recent Labs  Lab 09/07/18 0805 09/08/18 1215 09/09/18 0429  NA 138 140 140  K 3.1* 3.3* 3.4*  CL 110 111 109  CO2 21* 20* 21*  GLUCOSE 120* 108* 106*  BUN 18 19 17   CREATININE 1.08* 0.93 1.15*  CALCIUM 8.7* 8.5* 8.9   GFR: Estimated Creatinine Clearance: 37.3 mL/min (A) (by C-G formula based on SCr of 1.15 mg/dL (H)). Liver Function Tests: No results for input(s): AST, ALT, ALKPHOS, BILITOT, PROT, ALBUMIN in the last 168 hours. No results for input(s): LIPASE, AMYLASE in the last 168 hours. No results for input(s): AMMONIA in the last 168 hours. Coagulation Profile: No results for input(s): INR, PROTIME in the last 168 hours. Cardiac Enzymes: No results  for input(s): CKTOTAL, CKMB, CKMBINDEX, TROPONINI in the last 168 hours. BNP (last 3 results) No results for input(s): PROBNP in the last 8760 hours. HbA1C: No results for input(s): HGBA1C in the last 72 hours. CBG: No results for input(s): GLUCAP in the last 168 hours. Lipid Profile: No results for input(s): CHOL, HDL, LDLCALC, TRIG, CHOLHDL, LDLDIRECT in the last 72 hours. Thyroid Function Tests: No results for input(s): TSH, T4TOTAL, FREET4, T3FREE, THYROIDAB in the last 72 hours. Anemia Panel: No results for input(s): VITAMINB12,  FOLATE, FERRITIN, TIBC, IRON, RETICCTPCT in the last 72 hours. Urine analysis:    Component Value Date/Time   COLORURINE YELLOW 09/05/2018 2229   APPEARANCEUR HAZY (A) 09/05/2018 2229   LABSPEC >1.046 (H) 09/05/2018 2229   PHURINE 5.0 09/05/2018 2229   GLUCOSEU NEGATIVE 09/05/2018 2229   HGBUR SMALL (A) 09/05/2018 2229   BILIRUBINUR NEGATIVE 09/05/2018 2229   KETONESUR NEGATIVE 09/05/2018 2229   PROTEINUR NEGATIVE 09/05/2018 2229   UROBILINOGEN 0.2 05/03/2014 2024   NITRITE NEGATIVE 09/05/2018 2229   LEUKOCYTESUR SMALL (A) 09/05/2018 2229   Sepsis Labs: @LABRCNTIP (procalcitonin:4,lacticidven:4)  ) Recent Results (from the past 240 hour(s))  Culture, Urine     Status: Abnormal   Collection Time: 09/05/18 10:29 PM  Result Value Ref Range Status   Specimen Description URINE, RANDOM  Final   Special Requests ADDED 0134 09/06/18  Final   Culture MULTIPLE SPECIES PRESENT, SUGGEST RECOLLECTION (A)  Final   Report Status 09/07/2018 FINAL  Final      Radiology Studies: No results found.   Scheduled Meds: .  stroke: mapping our early stages of recovery book   Does not apply Once  . aspirin  300 mg Rectal Daily   Or  . aspirin  325 mg Oral Daily  . atorvastatin  40 mg Oral q1800  . clopidogrel  75 mg Oral Daily  . enoxaparin (LOVENOX) injection  40 mg Subcutaneous Q24H  . levothyroxine  75 mcg Oral Q0600  . lisinopril  20 mg Oral Daily  . LORazepam  1 mg Intravenous Once  . traZODone  150 mg Oral QHS   Continuous Infusions:   LOS: 8 days time

## 2018-09-14 MED ORDER — CLOPIDOGREL BISULFATE 75 MG PO TABS: 75.0000 mg | ORAL_TABLET | Freq: Every day | ORAL | 11 refills | Status: DC

## 2018-09-14 MED ORDER — LORAZEPAM 1 MG PO TABS: 1.0000 mg | ORAL_TABLET | Freq: Every day | ORAL | 0 refills | Status: AC | PRN

## 2018-09-14 MED ORDER — POLYETHYLENE GLYCOL 3350 17 G PO PACK: 17.0000 g | PACK | Freq: Every day | ORAL | Status: DC | PRN

## 2018-09-14 MED ORDER — ATORVASTATIN CALCIUM 40 MG PO TABS: 40.0000 mg | ORAL_TABLET | Freq: Every day | ORAL | 0 refills | Status: AC

## 2018-09-14 MED ORDER — ACETAMINOPHEN 325 MG PO TABS: 650.0000 mg | ORAL_TABLET | ORAL | Status: AC | PRN

## 2018-09-14 MED ORDER — ASPIRIN 325 MG PO TABS: 325.0000 mg | ORAL_TABLET | Freq: Every day | ORAL | Status: DC

## 2018-09-14 MED ORDER — TRAZODONE HCL 150 MG PO TABS: 150.0000 mg | ORAL_TABLET | Freq: Every day | ORAL | 0 refills | Status: AC

## 2018-09-14 NOTE — Progress Notes (Signed)
  Speech Language Pathology Treatment: Cognitive-Linquistic  Patient Details Name: Wendy Andrews MRN: [id number] DOB: 09/03/1930 Today's Date: 09/14/2018 Time: [id number] SLP Time Calculation (min) (ACUTE ONLY): 10 min  Assessment / Plan / Recommendation Clinical Impression  SLP received pt attempting to get out of bed to use bathroom. Bed alarm going off but pt insistent that it is ok for her to ambulate by herself. SLP provided assistance with use of RW to bathroom with max A cues for left inattention to avoid hitting left with RW. Pt with continent urine and returned to bed. Despite Max A cues, pt was not able to demonstrate awareness of need for assistance when getting out of bed. Pt left in bed, bed alarm on and all needs within reach. Continue per current plan of care.    HPI HPI: Wendy Andrews is a 84 y.o. female with medical history significant of HTN, HLD. Patien lives alone and family was unable to contact. A wellfare check was done and EMS found her down trying to get to door. She has L sided weakness and left sided neglect. MRI showed an acute posterior righ MCA territor infarct.       SLP Plan  Continue with current plan of care                      Follow up Recommendations: Skilled Nursing facility SLP Visit Diagnosis: Cognitive communication deficit (Y78.295(R41.841) Plan: Continue with current plan of care       GO                Dayna Geurts 09/14/2018, 4:04 PM

## 2018-09-14 NOTE — Discharge Summary (Signed)
Physician Discharge Summary  Wendy Andrews FAO:[id number] DOB: January 01, 1930 DOA: 09/05/2018  PCP: Burton Apley, MD  Admit date: 09/05/2018 Discharge date: 09/14/2018  Time spent: 25 minutes  Recommendations for Outpatient Follow-up:  1. Continue aspirin 325 and Plavix 75 daily for 3 months and then discontinue Plavix and continue aspirin 325 2. Needs basic metabolic panel and CBC in about 1 week  Discharge Diagnoses:  Principal Problem:   Acute ischemic right MCA stroke (HCC) Active Problems:   HTN (hypertension)   Hypothyroidism   HLD (hyperlipidemia)   Hypokalemia   Leukocytosis   Interstitial cystitis   Discharge Condition: Improved  Diet recommendation: Heart healthy  Filed Weights   09/05/18 2116  Weight: 86.1 kg    History of present illness:   84 y.o.femalefHTN, HLD as well as cholecystectomy 2015 Presented to the emergency room 11/16 with 1 day history L sided weakness present since at least yesterday.  Patient was stumbling around yesterday she says. Today family hadnt heard from patient so they asked for a wellfare check. EMS found patient on floor trying to get to door  Hospital Course:  Acute CVA -MRI showed MCA territory infarct CT a positive for LVO echo 66 5% LDL 165 A1c 5.3- Neurology recommended Plavix aspirin for 3 months followed by aspirin alone TEE showed no thrombus or patient [place] [employer] recorder was placed 11/19 Stable for past several days awaiting placement  Asymptomatic bacteriuria  -UA rare bacteria, 21-50 WBC, negative nitrites, small leukocytes Urine analysis showed multiple colony-forming units- -Antibiotics currently held   Essential hypertension -Home medications held, allowed for permissive hypertension -restarted lisinopril 20--watch trends -PRN added for SYS bp over 200 -May need to adjust medications in the outpatient setting  Hyperlipidemia -Continue statin  Hypothyroidism - synthroid 75 mg  daily -recheck TSH 1 mo  Consultants Neurology Inpatient rehab  Cardiology   Procedures  Echocardiogram Lower extremity doppler  Discharge Exam: Vitals:   09/14/18 0401 09/14/18 0819  BP: (!) 157/77 136/66  Pulse: 76 62  Resp: 16 18  Temp: 97.7 F (36.5 C) 98.1 F (36.7 C)  SpO2: 95% 100%    General: Pleasant alert out of bed into chair had breakfast smiling no distress no focal deficit Cardiovascular: S1-S2 no murmur Respiratory: Clinically clear no added sound Small symmetric power 5/5 bilaterally gait not assessed speech intact extraocular movements intact sensory grossly intact  Discharge Instructions   Discharge Instructions    Ambulatory referral to Neurology   Complete by:  As directed    Follow up with stroke clinic NP (Jessica Vanschaick or Darrol Angel, if both not available, consider Manson Allan, or Ahern) at [facility] in about 4 weeks. Thanks.   Diet - low sodium heart healthy   Complete by:  As directed    Increase activity slowly   Complete by:  As directed      Allergies as of 09/14/2018      Reactions   Tetanus Toxoids    unknown   Penicillins Hives, Rash      Medication List    TAKE these medications   acetaminophen 325 MG tablet Commonly known as:  TYLENOL Take 2 tablets (650 mg total) by mouth every 4 (four) hours as needed for mild pain (or temp > 37.5 C (99.5 F)).   aspirin 325 MG tablet Take 1 tablet (325 mg total) by mouth daily. Start taking on:  09/15/2018   atorvastatin 40 MG tablet Commonly known as:  LIPITOR Take 1 tablet (40 mg  total) by mouth daily at 6 PM.   clopidogrel 75 MG tablet Commonly known as:  PLAVIX Take 1 tablet (75 mg total) by mouth daily.   levothyroxine 75 MCG tablet Commonly known as:  SYNTHROID, LEVOTHROID Take 75 mcg by mouth daily.   lisinopril 20 MG tablet Commonly known as:  PRINIVIL,ZESTRIL Take 20 mg by mouth daily.   LORazepam 1 MG tablet Commonly known as:  ATIVAN Take 1 tablet (1  mg total) by mouth daily as needed for anxiety.   meloxicam 7.5 MG tablet Commonly known as:  MOBIC Take 7.5 mg by mouth daily.   traZODone 150 MG tablet Commonly known as:  DESYREL Take 1 tablet (150 mg total) by mouth at bedtime.      Allergies  Allergen Reactions  . Tetanus Toxoids     unknown  . Penicillins Hives and Rash    Contact information for follow-up providers    [facility] [address] Follow up on 10/07/2018.   Specialty:  Cardiology Why:  at [employer] information: [address] [place] [address] [phone]       [facility]. Schedule an appointment as soon as possible for a visit in 4 week(s).   Specialty:  Neurology Contact information: [address] [place] [address] [phone]           Contact information for after-discharge care    Destination    [facility] .   Service:  Skilled Nursing Contact information: [address] [place] [address] [phone]                   The results of significant diagnostics from this hospitalization (including imaging, microbiology, ancillary and laboratory) are listed below for reference.    Significant Diagnostic Studies: Ct Angio Head W Or Wo Contrast  Result Date: 09/05/2018 CLINICAL DATA:  84 year old female code stroke presentation with CT changes of acute right MCA territory infarct. EXAM: CT ANGIOGRAPHY HEAD AND NECK CT PERFUSION BRAIN TECHNIQUE: Multidetector CT imaging of the head and neck was performed using the standard protocol during bolus administration of intravenous contrast. Multiplanar CT image reconstructions and MIPs were obtained to evaluate the vascular anatomy. Carotid stenosis measurements (when applicable) are obtained utilizing NASCET criteria, using the distal internal carotid diameter as the denominator. Multiphase CT imaging of the brain was  performed following IV bolus contrast injection. Subsequent parametric perfusion maps were calculated using RAPID software. CONTRAST:  ISOVUE-370 IOPAMIDOL (ISOVUE-370) INJECTION 76% COMPARISON:  Head CT without contrast 2044 hours. FINDINGS: CT Brain Perfusion Findings: ASPECTS 7 versus 8 on the plain head CT today. CBF (<30%) Volume: 0 (erroneous). Perfusion (Tmax>6.0s) volume: 19mL, much of which seems to correspond to the aspects changes on plain CT. Mismatch Volume: 19mL, but thought to be partially erroneous. Infarction Location:Posterior right MCA territory. CTA NECK Skeleton: Absent maxillary dentition and degenerative changes in the spine. No acute osseous abnormality identified. Upper chest: Gas trapping and/or atelectasis scattered in the upper lungs. No superior mediastinal lymphadenopathy. Other neck: Incidental venous contrast reflux into prominent lower internal jugular veins. Otherwise negative. Aortic arch: 3 vessel arch configuration. Moderate arch atherosclerosis although primarily distal to the great vessel origins. But furthermore, some of that plaque is ulcerated as seen on series 7, image 157. Right carotid system: Atherosclerosis without stenosis. Left carotid system: Soft plaque in the ventral left CCA without stenosis. Soft and calcified plaque at the left ICA origin  and bulb resulting in less than 50 % stenosis with respect to the distal vessel. Vertebral arteries: Right subclavian artery origin plaque resulting in 50% stenosis. Normal right vertebral artery origin. Dominant right vertebral artery with intermittent atherosclerosis but no hemodynamically significant stenosis to the skull base. No proximal left subclavian artery stenosis despite plaque. Non dominant left vertebral artery. Mild to moderate V1 segment stenosis. The left vertebral remains small but patent to the skull base. CTA HEAD Posterior circulation: The left vertebral functionally terminates in PICA. Moderate right  V4 segment stenosis occurs due to calcified plaque near the right PICA origin which remains patent. Patent vertebrobasilar junction. Moderately irregular and diminutive basilar artery with moderate stenosis just proximal to the SCA origins. Fetal type left PCA origin with severe left posterior communicating artery stenosis (series 10, image 36). Widespread mild irregularity and stenosis of the right PCA branches. Anterior circulation: Both ICA siphons are patent. Abundant calcified plaque bilaterally. Mild bilateral supraclinoid siphon stenosis results. Normal ophthalmic and left posterior communicating artery origins. Patent carotid termini. Dominant right ACA. The diminutive left A1 segment is stenotic. The right ACA remains dominant throughout. There is moderate stenosis of the right pericallosal artery (series 10, image 34). Left MCA M1 segment is patent without stenosis. Left MCA bifurcation is patent. No left MCA branch occlusion identified. There is widespread distal left MCA branch irregularity. Right MCA M1 segment and bifurcation are patent. There is a right MCA posterior division M2 occlusion just beyond the bifurcation (series 9, image 124). Venous sinuses: Patent. Anatomic variants: Dominant right vertebral artery, the left functionally terminates in PICA. Fetal type left PCA origin. Review of the MIP images confirms the above findings IMPRESSION: 1. Positive for LVO: Right MCA posterior M2. 2. However, CTP does not detect the known posterior right MCA core infarct (ASPECTS 7 versus 8 today), and the 19 mL of CTP penumbra seems to largely correspond to the cytotoxic edema. 3. The above was discussed by telephone with Dr. Ritta SlotMCNEILL KIRKPATRICK on 09/05/2018 at 2102 hours. 4. Additionally, there is extensive intracranial atherosclerosis with significant stenoses: - dominant Right Vertebral Artery V4 segment (moderate). - Basilar Artery (moderate). - Left Pcomm, fetal left PCA origin (severe). - dominant  Right ACA pericallosal artery (moderate). 5. Aortic Atherosclerosis (ICD10-I70.0) with some ulcerated plaque in the distal arch. Electronically Signed   By: Odessa FlemingH  Hall M.D.   On: 09/05/2018 21:41   Ct Angio Neck W Or Wo Contrast  Result Date: 09/05/2018 CLINICAL DATA:  84 year old female code stroke presentation with CT changes of acute right MCA territory infarct. EXAM: CT ANGIOGRAPHY HEAD AND NECK CT PERFUSION BRAIN TECHNIQUE: Multidetector CT imaging of the head and neck was performed using the standard protocol during bolus administration of intravenous contrast. Multiplanar CT image reconstructions and MIPs were obtained to evaluate the vascular anatomy. Carotid stenosis measurements (when applicable) are obtained utilizing NASCET criteria, using the distal internal carotid diameter as the denominator. Multiphase CT imaging of the brain was performed following IV bolus contrast injection. Subsequent parametric perfusion maps were calculated using RAPID software. CONTRAST:  125mL ISOVUE-370 IOPAMIDOL (ISOVUE-370) INJECTION 76% COMPARISON:  Head CT without contrast 2044 hours. FINDINGS: CT Brain Perfusion Findings: ASPECTS 7 versus 8 on the plain head CT today. CBF (<30%) Volume: 0 (erroneous). Perfusion (Tmax>6.0s) volume: 19mL, much of which seems to correspond to the aspects changes on plain CT. Mismatch Volume: 19mL, but thought to be partially erroneous. Infarction Location:Posterior right MCA territory. CTA NECK Skeleton: Absent maxillary dentition and degenerative  changes in the spine. No acute osseous abnormality identified. Upper chest: Gas trapping and/or atelectasis scattered in the upper lungs. No superior mediastinal lymphadenopathy. Other neck: Incidental venous contrast reflux into prominent lower internal jugular veins. Otherwise negative. Aortic arch: 3 vessel arch configuration. Moderate arch atherosclerosis although primarily distal to the great vessel origins. But furthermore, some of that  plaque is ulcerated as seen on series 7, image 157. Right carotid system: Atherosclerosis without stenosis. Left carotid system: Soft plaque in the ventral left CCA without stenosis. Soft and calcified plaque at the left ICA origin and bulb resulting in less than 50 % stenosis with respect to the distal vessel. Vertebral arteries: Right subclavian artery origin plaque resulting in 50% stenosis. Normal right vertebral artery origin. Dominant right vertebral artery with intermittent atherosclerosis but no hemodynamically significant stenosis to the skull base. No proximal left subclavian artery stenosis despite plaque. Non dominant left vertebral artery. Mild to moderate V1 segment stenosis. The left vertebral remains small but patent to the skull base. CTA HEAD Posterior circulation: The left vertebral functionally terminates in PICA. Moderate right V4 segment stenosis occurs due to calcified plaque near the right PICA origin which remains patent. Patent vertebrobasilar junction. Moderately irregular and diminutive basilar artery with moderate stenosis just proximal to the SCA origins. Fetal type left PCA origin with severe left posterior communicating artery stenosis (series 10, image 36). Widespread mild irregularity and stenosis of the right PCA branches. Anterior circulation: Both ICA siphons are patent. Abundant calcified plaque bilaterally. Mild bilateral supraclinoid siphon stenosis results. Normal ophthalmic and left posterior communicating artery origins. Patent carotid termini. Dominant right ACA. The diminutive left A1 segment is stenotic. The right ACA remains dominant throughout. There is moderate stenosis of the right pericallosal artery (series 10, image 34). Left MCA M1 segment is patent without stenosis. Left MCA bifurcation is patent. No left MCA branch occlusion identified. There is widespread distal left MCA branch irregularity. Right MCA M1 segment and bifurcation are patent. There is a right MCA  posterior division M2 occlusion just beyond the bifurcation (series 9, image 124). Venous sinuses: Patent. Anatomic variants: Dominant right vertebral artery, the left functionally terminates in PICA. Fetal type left PCA origin. Review of the MIP images confirms the above findings IMPRESSION: 1. Positive for LVO: Right MCA posterior M2. 2. However, CTP does not detect the known posterior right MCA core infarct (ASPECTS 7 versus 8 today), and the 19 mL of CTP penumbra seems to largely correspond to the cytotoxic edema. 3. The above was discussed by telephone with Dr. Ritta Slot on 09/05/2018 at 2102 hours. 4. Additionally, there is extensive intracranial atherosclerosis with significant stenoses: - dominant Right Vertebral Artery V4 segment (moderate). - Basilar Artery (moderate). - Left Pcomm, fetal left PCA origin (severe). - dominant Right ACA pericallosal artery (moderate). 5. Aortic Atherosclerosis (ICD10-I70.0) with some ulcerated plaque in the distal arch. Electronically Signed   By: Odessa Fleming M.D.   On: 09/05/2018 21:41   Mr Brain Wo Contrast  Result Date: 09/06/2018 CLINICAL DATA:  Left-sided weakness.  Right MCA infarct. EXAM: MRI HEAD WITHOUT CONTRAST TECHNIQUE: Multiplanar, multiecho pulse sequences of the brain and surrounding structures were obtained without intravenous contrast. COMPARISON:  Head CT, CTA, and CT perfusion 09/05/2018. Brain MRI 05/25/2010. FINDINGS: Multiple sequences are moderately motion degraded despite using faster, more motion resistant imaging protocols. Brain: There is an acute posterior right MCA territory infarct with patchy cortical and white matter restricted diffusion greatest in the parietal lobe. The infarction  extends into the posterior right corona radiata, posterior right temporal lobe, and insula and subinsular white matter. There is mild cytotoxic edema without mass effect or hemorrhage. Patchy to confluent T2 hyperintensities elsewhere in the cerebral  white matter bilaterally are nonspecific but compatible with extensive chronic small vessel ischemic disease. Chronic lacunar infarcts are present in the basal ganglia bilaterally. Cerebral atrophy is not greater than expected for age. No mass, midline shift, or extra-axial fluid collection is seen. Vascular: FLAIR hyperintensity anteriorly in the right sylvian fissure corresponding to M2 occlusion on CTA. Hypoplastic left vertebral artery. Skull and upper cervical spine: Unremarkable bone marrow signal. Sinuses/Orbits: Bilateral cataract extraction. Trace right mastoid effusion. Clear paranasal sinuses. Other: None. IMPRESSION: 1. Moderate-sized acute posterior right MCA infarct.  No hemorrhage. 2. Extensive chronic small vessel ischemic disease. Electronically Signed   By: Sebastian Ache M.D.   On: 09/06/2018 09:02   Ct Cerebral Perfusion W Contrast  Result Date: 09/05/2018 CLINICAL DATA:  84 year old female code stroke presentation with CT changes of acute right MCA territory infarct. EXAM: CT ANGIOGRAPHY HEAD AND NECK CT PERFUSION BRAIN TECHNIQUE: Multidetector CT imaging of the head and neck was performed using the standard protocol during bolus administration of intravenous contrast. Multiplanar CT image reconstructions and MIPs were obtained to evaluate the vascular anatomy. Carotid stenosis measurements (when applicable) are obtained utilizing NASCET criteria, using the distal internal carotid diameter as the denominator. Multiphase CT imaging of the brain was performed following IV bolus contrast injection. Subsequent parametric perfusion maps were calculated using RAPID software. CONTRAST:  ISOVUE-370 IOPAMIDOL (ISOVUE-370) INJECTION 76% COMPARISON:  Head CT without contrast 2044 hours. FINDINGS: CT Brain Perfusion Findings: ASPECTS 7 versus 8 on the plain head CT today. CBF (<30%) Volume: 0 (erroneous). Perfusion (Tmax>6.0s) volume: 19mL, much of which seems to correspond to the aspects changes  on plain CT. Mismatch Volume: 19mL, but thought to be partially erroneous. Infarction Location:Posterior right MCA territory. CTA NECK Skeleton: Absent maxillary dentition and degenerative changes in the spine. No acute osseous abnormality identified. Upper chest: Gas trapping and/or atelectasis scattered in the upper lungs. No superior mediastinal lymphadenopathy. Other neck: Incidental venous contrast reflux into prominent lower internal jugular veins. Otherwise negative. Aortic arch: 3 vessel arch configuration. Moderate arch atherosclerosis although primarily distal to the great vessel origins. But furthermore, some of that plaque is ulcerated as seen on series 7, image 157. Right carotid system: Atherosclerosis without stenosis. Left carotid system: Soft plaque in the ventral left CCA without stenosis. Soft and calcified plaque at the left ICA origin and bulb resulting in less than 50 % stenosis with respect to the distal vessel. Vertebral arteries: Right subclavian artery origin plaque resulting in 50% stenosis. Normal right vertebral artery origin. Dominant right vertebral artery with intermittent atherosclerosis but no hemodynamically significant stenosis to the skull base. No proximal left subclavian artery stenosis despite plaque. Non dominant left vertebral artery. Mild to moderate V1 segment stenosis. The left vertebral remains small but patent to the skull base. CTA HEAD Posterior circulation: The left vertebral functionally terminates in PICA. Moderate right V4 segment stenosis occurs due to calcified plaque near the right PICA origin which remains patent. Patent vertebrobasilar junction. Moderately irregular and diminutive basilar artery with moderate stenosis just proximal to the SCA origins. Fetal type left PCA origin with severe left posterior communicating artery stenosis (series 10, image 36). Widespread mild irregularity and stenosis of the right PCA branches. Anterior circulation: Both ICA  siphons are patent. Abundant calcified plaque bilaterally.  Mild bilateral supraclinoid siphon stenosis results. Normal ophthalmic and left posterior communicating artery origins. Patent carotid termini. Dominant right ACA. The diminutive left A1 segment is stenotic. The right ACA remains dominant throughout. There is moderate stenosis of the right pericallosal artery (series 10, image 34). Left MCA M1 segment is patent without stenosis. Left MCA bifurcation is patent. No left MCA branch occlusion identified. There is widespread distal left MCA branch irregularity. Right MCA M1 segment and bifurcation are patent. There is a right MCA posterior division M2 occlusion just beyond the bifurcation (series 9, image 124). Venous sinuses: Patent. Anatomic variants: Dominant right vertebral artery, the left functionally terminates in PICA. Fetal type left PCA origin. Review of the MIP images confirms the above findings IMPRESSION: 1. Positive for LVO: Right MCA posterior M2. 2. However, CTP does not detect the known posterior right MCA core infarct (ASPECTS 7 versus 8 today), and the 19 mL of CTP penumbra seems to largely correspond to the cytotoxic edema. 3. The above was discussed by telephone with Dr. Ritta Slot on 09/05/2018 at 2102 hours. 4. Additionally, there is extensive intracranial atherosclerosis with significant stenoses: - dominant Right Vertebral Artery V4 segment (moderate). - Basilar Artery (moderate). - Left Pcomm, fetal left PCA origin (severe). - dominant Right ACA pericallosal artery (moderate). 5. Aortic Atherosclerosis (ICD10-I70.0) with some ulcerated plaque in the distal arch. Electronically Signed   By: Odessa Fleming M.D.   On: 09/05/2018 21:41   Dg Chest Port 1 View  Result Date: 09/06/2018 CLINICAL DATA:  Stroke EXAM: PORTABLE CHEST 1 VIEW COMPARISON:  11/18/2017 FINDINGS: There are low lung volumes. There is no focal consolidation. There are prominent interstitial markings at the lung bases  likely secondary to low lung volumes. There is no pleural effusion or pneumothorax. The heart and mediastinal contours are unremarkable. 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 There is moderate osteoarthritis of the right glenohumeral joint. IMPRESSION: No active disease. Electronically Signed   By: Elige Ko   On: 09/06/2018 00:10   Ct Head Code Stroke Wo Contrast  Result Date: 09/05/2018 CLINICAL DATA:  Code stroke. 84 year old female with left side weakness. EXAM: CT HEAD WITHOUT CONTRAST TECHNIQUE: Contiguous axial images were obtained from the base of the skull through the vertex without intravenous contrast. COMPARISON:  03/07/2018 head CT. FINDINGS: Brain: Cytotoxic edema in the posterior right MCA territory on series 8, image 22. Chronic but progressed bilateral basal ganglia and external capsule white matter hypodensity since May (series 8, image 16. Other confluent bilateral cerebral white matter hypodensity appears stable. No acute intracranial hemorrhage identified. No midline shift, mass effect, or evidence of intracranial mass lesion. No ventriculomegaly. Vascular: Calcified atherosclerosis at the skull base. No suspicious intracranial vascular hyperdensity. Skull: Negative. Sinuses/Orbits: Paranasal sinuses and mastoids are clear today. Other: No acute orbit or scalp soft tissue finding. ASPECTS [facility] Stroke Program Early CT Score) - Ganglionic level infarction (caudate, lentiform nuclei, internal capsule, insula, M1-M3 cortex): 5 (abnormal right lentiform and M2 segment) - Supraganglionic infarction (M4-M6 cortex): 2 (abnormal right M5 segment). Total score (0-10 with 10 being normal): 7 IMPRESSION: 1. Evidence of acute posterior right MCA territory infarct. No hemorrhage or mass effect. 2. ASPECTS is 7. 3. Other bilateral progressed chronic small vessel disease since May 2019. 4. These results were communicated to Dr. Amada Jupiter at 8:58 pmon 11/16/2019by text page  via the [facility] messaging system. Electronically Signed   By: Odessa Fleming M.D.   On: 09/05/2018 20:58   Vas  [place] Lower Extremity Venous (dvt)  Result Date: 09/07/2018  Lower Venous Study Indications: Stroke.  Performing Technologist: Farrel Demark RDMS, RVT  Examination Guidelines: A complete evaluation includes B-mode imaging, spectral Doppler, color Doppler, and power Doppler as needed of all accessible portions of each vessel. Bilateral testing is considered an integral part of a complete examination. Limited examinations for reoccurring indications may be performed as noted.  Right Venous Findings: +---------+---------------+---------+-----------+----------+-------+          CompressibilityPhasicitySpontaneityPropertiesSummary +---------+---------------+---------+-----------+----------+-------+ CFV      Full           Yes      Yes                          +---------+---------------+---------+-----------+----------+-------+ SFJ      Full                                                 +---------+---------------+---------+-----------+----------+-------+ FV Prox  Full                                                 +---------+---------------+---------+-----------+----------+-------+ FV Mid   Full                                                 +---------+---------------+---------+-----------+----------+-------+ FV DistalFull                                                 +---------+---------------+---------+-----------+----------+-------+ PFV      Full                                                 +---------+---------------+---------+-----------+----------+-------+ POP      Full           Yes      Yes                          +---------+---------------+---------+-----------+----------+-------+ PTV      Full                                                 +---------+---------------+---------+-----------+----------+-------+ PERO     Full                                                  +---------+---------------+---------+-----------+----------+-------+  Left Venous Findings: +---------+---------------+---------+-----------+----------+-------+          CompressibilityPhasicitySpontaneityPropertiesSummary +---------+---------------+---------+-----------+----------+-------+ CFV      Full           Yes  Yes                          +---------+---------------+---------+-----------+----------+-------+ SFJ      Full                                                 +---------+---------------+---------+-----------+----------+-------+ FV Prox  Full                                                 +---------+---------------+---------+-----------+----------+-------+ FV Mid   Full                                                 +---------+---------------+---------+-----------+----------+-------+ FV DistalFull                                                 +---------+---------------+---------+-----------+----------+-------+ PFV      Full                                                 +---------+---------------+---------+-----------+----------+-------+ POP      Full           Yes      Yes                          +---------+---------------+---------+-----------+----------+-------+ PTV      Full                                                 +---------+---------------+---------+-----------+----------+-------+ PERO     Full                                                 +---------+---------------+---------+-----------+----------+-------+    Summary: Right: There is no evidence of deep vein thrombosis in the lower extremity. No cystic structure found in the popliteal fossa. Left: There is no evidence of deep vein thrombosis in the lower extremity. No cystic structure found in the popliteal fossa.  *See table(s) above for measurements and observations. Electronically signed by Lemar Livings MD on 09/07/2018  at 4:21:01 PM.    Final     Microbiology: Recent Results (from the past 240 hour(s))  Culture, Urine     Status: Abnormal   Collection Time: 09/05/18 10:29 PM  Result Value Ref Range Status   Specimen Description URINE, RANDOM  Final   Special Requests ADDED 0134 09/06/18  Final   Culture MULTIPLE SPECIES PRESENT, SUGGEST RECOLLECTION (A)  Final   Report Status 09/07/2018 FINAL  Final  Labs: Basic Metabolic Panel: Recent Labs  Lab 09/08/18 1215 09/09/18 0429  NA 140 140  K 3.3* 3.4*  CL 111 109  CO2 20* 21*  GLUCOSE 108* 106*  BUN 19 17  CREATININE 0.93 1.15*  CALCIUM 8.5* 8.9   Liver Function Tests: No results for input(s): AST, ALT, ALKPHOS, BILITOT, PROT, ALBUMIN in the last 168 hours. No results for input(s): LIPASE, AMYLASE in the last 168 hours. No results for input(s): AMMONIA in the last 168 hours. CBC: Recent Labs  Lab 09/09/18 0429  WBC 7.5  HGB 13.8  HCT 43.6  MCV 79.4*  PLT 286   Cardiac Enzymes: No results for input(s): CKTOTAL, CKMB, CKMBINDEX, TROPONINI in the last 168 hours. BNP: BNP (last 3 results) No results for input(s): BNP in the last 8760 hours.  ProBNP (last 3 results) No results for input(s): PROBNP in the last 8760 hours.  CBG: No results for input(s): GLUCAP in the last 168 hours.     Signed:  Rhetta Mura MD   [facility] 09/14/2018, 10:21 AM

## 2018-09-14 NOTE — Progress Notes (Signed)
Occupational Therapy Treatment Patient Details Name: Wendy Andrews MRN: [id number] DOB: 19-Oct-1930 Today's Date: 09/14/2018    History of present illness Pt is an 84 y.o. female admitted 09/05/18 with L-sided weakness; family had not heard from pt, so EMS performed welfare check and found pt down at home. CT showed acute posterior R MCA territory infarct; chronic small vessel disease. 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 Pt s/p TEE and loop recorder insertion 11/19. PMH includes HTN, HLD.   OT comments  Pt progressing towards OT goals, presents sitting up in recliner having just worked with PT, agreeable to treatment session. Focus of session on LUE A/AAROM and fine motor/coordination. Pt continues to demonstrate LUE weakness and impaired coordination with fine motor tasks, requiring increased time/effort to perform. Pt also demonstrating L inattention during mobility requiring cues/assist to correct. Noted pt to have CIR denial, dispo recommendations have been updated to reflect. Recommend ST rehab stay in SNF setting prior to return home to maximize pt's overall safety and independence with ADLs and mobility. Will continue to follow acutely.    Follow Up Recommendations  SNF;Supervision/Assistance - 24 hour    Equipment Recommendations  Other (comment)(TBD in next venue)    Recommendations for Other Services      Precautions / Restrictions Precautions Precautions: Fall Restrictions Weight Bearing Restrictions: No       Mobility Bed Mobility Overal bed mobility: Needs Assistance Bed Mobility: Supine to Sit     Supine to sit: Supervision;HOB elevated     General bed mobility comments: OOB with PT at start of session   Transfers Overall transfer level: Needs assistance Equipment used: Rolling walker (2 wheeled) Transfers: Sit to/from Stand Sit to Stand: Min assist         General transfer comment: Initial verbal cues  to push from bed instead of pulling on RW, pt continued to attempt pulling, requiring hand over hand assist for correct hand placement; minA for trunk elevation and balance    Balance Overall balance assessment: Needs assistance Sitting-balance support: No upper extremity supported;Feet supported Sitting balance-Leahy Scale: Good Sitting balance - Comments: supervision for sitting balance   Standing balance support: Single extremity supported;During functional activity Standing balance-Leahy Scale: Poor Standing balance comment: Prolonged standing at sink for standing ADL tasks; pt leaning forward with abdomen resting on sink for balance, when cued to lean back, required UE support to maintain balance                           ADL either performed or assessed with clinical judgement   ADL Overall ADL's : Needs assistance/impaired                                     Functional mobility during ADLs: Minimal assistance;Rolling walker General ADL Comments: focus of session on practicing use of LUE during FM/coordination tasks; pt recently completed ADL tasks this AM     Vision   Additional Comments: pt noted to bump into chair on her L side when returning to recliner with PT at start of OT session   Perception     Praxis      Cognition Arousal/Alertness: Awake/alert Behavior During Therapy: Flat affect;WFL for tasks assessed/performed Overall Cognitive Status: No family/caregiver present to determine baseline cognitive functioning Area of Impairment: Attention;Following commands;Safety/judgement;Awareness;Problem solving;Memory  Current Attention Level: Sustained Memory: Decreased short-term memory Following Commands: Follows one step commands consistently;Follows multi-step commands inconsistently Safety/Judgement: Decreased awareness of safety;Decreased awareness of deficits Awareness: Emergent Problem Solving: Slow  processing;Requires verbal cues General Comments: increased time and occasionally requiring tactile cues for following commands;         Exercises Exercises: Other exercises;General Upper Extremity;Hand exercises;Hand activities General Exercises - Upper Extremity Shoulder Flexion: AAROM;Left;10 reps;Seated(pt noted to have compensatory movements to achieve >90*) Shoulder Extension: AAROM;10 reps;Left;Seated Elbow Flexion: AROM;Left;10 reps;Seated Elbow Extension: AROM;10 reps;Left;Seated Digit Composite Flexion: AROM;10 reps;Left;Seated Composite Extension: AROM;10 reps;Left;Seated Hand Exercises Forearm Supination: AROM;10 reps;Left;Seated Forearm Pronation: AROM;10 reps;Left;Seated Digit Composite Abduction: AROM;5 reps;Seated;Left Digit Composite Adduction: AROM;Left;5 reps;Seated Opposition: AROM;5 reps;Both;Seated Other Exercises Other Exercises: opening/closing lids of varying sizes using LUE; folding washcloths using bil UEs; practicing finger to nose with bil UEs for improving coordination/accuracy as pt continues to have difficulty with use of LUE during task   Shoulder Instructions       General Comments Increased time for problem solving ADL tasks at sink. Poor BUE fine motor; using LUE adequately, but seems uncoordinated    Pertinent Vitals/ Pain       Pain Assessment: No/denies pain  Home Living                                          Prior Functioning/Environment              Frequency  Min 2X/week        Progress Toward Goals  OT Goals(current goals can now be found in the care plan section)  Progress towards OT goals: Progressing toward goals  Acute Rehab OT Goals Patient Stated Goal: "I'm ready to go home" OT Goal Formulation: With patient Time For Goal Achievement: 09/21/18 Potential to Achieve Goals: Good ADL Goals Pt Will Perform Grooming: with modified independence;sitting Pt Will Perform Upper Body Dressing: with  supervision;with set-up;sitting Pt Will Perform Lower Body Dressing: with set-up;with supervision;sit to/from stand Pt Will Transfer to Toilet: with supervision;ambulating;bedside commode Pt Will Perform Toileting - Clothing Manipulation and hygiene: with supervision;sit to/from stand Pt/caregiver will Perform Home Exercise Program: Left upper extremity;With written HEP provided;With theraband Additional ADL Goal #1: Pt will attend to L side during ADLs and functional transfers with supervision.  Plan Discharge plan needs to be updated    Co-evaluation                 AM-PAC OT "6 Clicks" Daily Activity     Outcome Measure   Help from another person eating meals?: None Help from another person taking care of personal grooming?: A Little Help from another person toileting, which includes using toliet, bedpan, or urinal?: A Lot Help from another person bathing (including washing, rinsing, drying)?: A Lot Help from another person to put on and taking off regular upper body clothing?: A Little Help from another person to put on and taking off regular lower body clothing?: A Lot 6 Click Score: 16    End of Session    OT Visit Diagnosis: Other abnormalities of gait and mobility (R26.89);Unsteadiness on feet (R26.81);Muscle weakness (generalized) (M62.81);Other symptoms and signs involving the nervous system (R29.898);Other symptoms and signs involving cognitive function;Hemiplegia and hemiparesis Hemiplegia - Right/Left: Left Hemiplegia - dominant/non-dominant: Non-Dominant Hemiplegia - caused by: Cerebral infarction   Activity Tolerance Patient tolerated treatment well   Patient Left  in chair;with call bell/phone within reach;with chair alarm set   Nurse Communication Mobility status        Time: [phone] OT Time Calculation (min): 19 min  Charges: OT General Charges $OT Visit: 1 Visit OT Treatments $Therapeutic Activity: 8-22 mins  Marcy Siren, OT Supplemental  Rehabilitation Services Pager [phone] Office [phone]    Orlando Penner 09/14/2018, 9:52 AM

## 2018-09-14 NOTE — Progress Notes (Addendum)
Physical Therapy Treatment Patient Details Name: Wendy LeavellLillian B Andrews MRN: [id number] DOB: 12/17/1929 Today's Date: 09/14/2018    History of Present Illness Pt is an 84 y.o. female admitted 09/05/18 with L-sided weakness; family had not heard from pt, so EMS performed welfare check and found pt down at home. CT showed acute posterior R MCA territory infarct; chronic small vessel disease. 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 Pt s/p TEE and loop recorder insertion 11/19. PMH includes HTN, HLD.   PT Comments    Pt progressing with mobility. Requires min-modA to maintain balance ambulating without assistive device; stability improved with use of RW, although pt requires max cues for gait mechanics and safety. Running into objects on L-side of walker. Reliant on UE support to maintain balance with standing ADL tasks at sink; demonstrates poor problem solving and fine motor control with this. Continue to recommend post-acute rehab prior to return home. Pt hopeful for d/c to SNF today.    Follow Up Recommendations  SNF;Supervision/Assistance - 24 hour(CIR declined-does not have 24/7 support)     Equipment Recommendations  (TBD next venue)    Recommendations for Other Services       Precautions / Restrictions Precautions Precautions: Fall Restrictions Weight Bearing Restrictions: No    Mobility  Bed Mobility Overal bed mobility: Needs Assistance Bed Mobility: Supine to Sit     Supine to sit: Supervision;HOB elevated     General bed mobility comments: Increased time and effort; pt reaching out for assist, cues to use bed rail  Transfers Overall transfer level: Needs assistance Equipment used: Rolling walker (2 wheeled) Transfers: Sit to/from Stand Sit to Stand: Min assist         General transfer comment: Initial verbal cues to push from bed instead of pulling on RW, pt continued to attempt pulling, requiring hand over hand assist  for correct hand placement; minA for trunk elevation and balance  Ambulation/Gait Ambulation/Gait assistance: Mod assist;Min guard   Assistive device: 1 person hand held assist;Rolling walker (2 wheeled) Gait Pattern/deviations: Shuffle;Decreased dorsiflexion - left;Trunk flexed Gait velocity: Decreased Gait velocity interpretation: <1.31 ft/sec, indicative of household ambulator General Gait Details: Heavy reliance on HHA and external assist to maintain balance ambulating without DME; slow, shuffling steps with difficulty clearing LLE. Stability improved with RW, but repeated, multimodal cues for gait mechanics as pt tends to flex far foward, pushing RW too far away and poor LLE navigation   Stairs             Wheelchair Mobility    Modified Rankin (Stroke Patients Only) Modified Rankin (Stroke Patients Only) Pre-Morbid Rankin Score: No symptoms Modified Rankin: Moderately severe disability     Balance Overall balance assessment: Needs assistance Sitting-balance support: No upper extremity supported;Feet supported Sitting balance-Leahy Scale: Good Sitting balance - Comments: supervision for sitting balance   Standing balance support: Single extremity supported;During functional activity Standing balance-Leahy Scale: Poor Standing balance comment: Prolonged standing at sink for standing ADL tasks; pt leaning forward with abdomen resting on sink for balance, when cued to lean back, required UE support to maintain balance                            Cognition Arousal/Alertness: Awake/alert Behavior During Therapy: Flat affect;WFL for tasks assessed/performed Overall Cognitive Status: No family/caregiver present to determine baseline cognitive functioning Area of Impairment: Attention;Following commands;Safety/judgement;Awareness;Problem solving;Memory  Current Attention Level: Sustained Memory: Decreased short-term memory Following  Commands: Follows one step commands consistently;Follows multi-step commands inconsistently Safety/Judgement: Decreased awareness of safety;Decreased awareness of deficits Awareness: Emergent Problem Solving: Slow processing;Requires verbal cues        Exercises      General Comments General comments (skin integrity, edema, etc.): Increased time for problem solving ADL tasks at sink. Poor BUE fine motor; using LUE adequately, but seems uncoordinated      Pertinent Vitals/Pain Pain Assessment: No/denies pain    Home Living                      Prior Function            PT Goals (current goals can now be found in the care plan section) Acute Rehab PT Goals Patient Stated Goal: "I'm ready to go home" PT Goal Formulation: With patient Time For Goal Achievement: 09/20/18 Potential to Achieve Goals: Fair Progress towards PT goals: Progressing toward goals    Frequency    Min 3X/week      PT Plan Frequency needs to be updated    Co-evaluation              AM-PAC PT "6 Clicks" Mobility   Outcome Measure  Help needed turning from your back to your side while in a flat bed without using bedrails?: A Little Help needed moving from lying on your back to sitting on the side of a flat bed without using bedrails?: A Little Help needed moving to and from a bed to a chair (including a wheelchair)?: A Little Help needed standing up from a chair using your arms (e.g., wheelchair or bedside chair)?: A Little Help needed to walk in hospital room?: A Little Help needed climbing 3-5 steps with a railing? : A Lot 6 Click Score: 17    End of Session Equipment Utilized During Treatment: Gait belt Activity Tolerance: Patient tolerated treatment well Patient left: in chair;with call bell/phone within reach(with OT) Nurse Communication: Mobility status PT Visit Diagnosis: Unsteadiness on feet (R26.81);Other abnormalities of gait and mobility (R26.89);Hemiplegia and  hemiparesis Hemiplegia - Right/Left: Left Hemiplegia - dominant/non-dominant: Non-dominant Hemiplegia - caused by: Cerebral infarction     Time: 0822-0836 PT Time Calculation (min) (ACUTE ONLY): 14 min  Charges:  $Therapeutic Activity: 8-22 mins                    Ina Homes, PT, DPT Acute Rehabilitation Services  Pager [phone] Office [phone]  Malachy Chamber 09/14/2018, 9:26 AM

## 2018-09-14 NOTE — Progress Notes (Signed)
Insurance Authorization is still pending. Followed up with nephew about ongoing care.

## 2018-09-14 NOTE — Progress Notes (Signed)
CSW following for discharge plan. Patient continues to await insurance authorization to admit to SNF. [employer] [place] [employer] has requested updates; CSW sent to [place]. Still awaiting decision.  CSW to follow.  Blenda NicelyElizabeth Myrle Wanek, [place] Clinical Social Worker [phone]

## 2018-09-15 NOTE — Progress Notes (Signed)
Pt. Being discharged via stretcher. Telemetry and CCMD notified. Pt. Belongings with pt. [facility] and [facility].

## 2018-09-15 NOTE — Clinical Social Work Placement (Signed)
Nurse to call report to [phone], Room 1206P     CLINICAL SOCIAL WORK PLACEMENT  NOTE  Date:  09/15/2018  Patient Details  Name: Wendy Andrews MRN: [id number] Date of Birth: 05/03/1930  Clinical Social Work is seeking post-discharge placement for this patient at the Skilled  Nursing Facility level of care (*CSW will initial, date and re-position this form in  chart as items are completed):  Yes   Patient/family provided with [place] Clinical Social Work Department's list of facilities offering this level of care within the geographic area requested by the patient (or if unable, by the patient's family).  Yes   Patient/family informed of their freedom to choose among providers that offer the needed level of care, that participate in Medicare, Medicaid or managed care program needed by the patient, have an available bed and are willing to accept the patient.  Yes   Patient/family informed of [facility]'s ownership interest [facility] and [facility], as well as of the fact that they are under no obligation to receive care at these facilities.  PASRR submitted to EDS on       PASRR number received on       Existing PASRR number confirmed on       FL2 transmitted to all facilities in geographic area requested by pt/family on       FL2 transmitted to all facilities within larger geographic area on       Patient informed that his/her managed care company has contracts with or will negotiate with certain facilities, including the following:        Yes   Patient/family informed of bed offers received.  [facility]     Physician recommends and patient chooses bed at      [facility]  Patient to be transferred to facility by [facility]     Patient family notified on 09/15/18 of transfer.  Name of family member notified:  Wendy Andrews     PHYSICIAN       Additional Comment:     _______________________________________________ Baldemar LenisElizabeth M Edmonia Gonser, LCSW 09/15/2018, 12:24 PM

## 2018-09-15 NOTE — Care Management Important Message (Signed)
Important Message  Patient Details  Name: Wendy LeavellLillian B Andrews MRN: [id number] Date of Birth: 10/09/1930   Medicare Important Message Given:  Yes    Teah Votaw P Thressa Shiffer 09/15/2018, 3:56 PM

## 2018-09-15 NOTE — Progress Notes (Signed)
Patient stable for d/c see summary done 11/25  Pleas KochJai Vanna Shavers, MD [facility] 11:46 AM

## 2018-09-30 ENCOUNTER — Encounter: Payer: Self-pay | Admitting: Cardiology

## 2018-10-06 ENCOUNTER — Telehealth: Payer: Self-pay

## 2018-10-06 ENCOUNTER — Encounter: Payer: Self-pay | Admitting: Cardiology

## 2018-10-06 NOTE — Telephone Encounter (Signed)
LMOVM requesting that pt send manual transmission b/c home monitor has not updated in at least 14 days.   Attempted to confirm remote transmission with pt. No answer and was unable to leave a message.   

## 2018-10-07 ENCOUNTER — Ambulatory Visit: Payer: 59

## 2018-10-12 ENCOUNTER — Ambulatory Visit (INDEPENDENT_AMBULATORY_CARE_PROVIDER_SITE_OTHER): Payer: Medicare HMO

## 2018-10-12 DIAGNOSIS — I63511 Cerebral infarction due to unspecified occlusion or stenosis of right middle cerebral artery: Secondary | ICD-10-CM

## 2018-10-12 LAB — CUP PACEART REMOTE DEVICE CHECK
Date Time Interrogation Session: 20191222190724
Implantable Pulse Generator Implant Date: 20191119

## 2018-10-12 NOTE — Progress Notes (Signed)
Carelink Summary Report / Loop Recorder 

## 2018-11-05 ENCOUNTER — Ambulatory Visit (INDEPENDENT_AMBULATORY_CARE_PROVIDER_SITE_OTHER): Payer: Medicare HMO | Admitting: Adult Health

## 2018-11-05 ENCOUNTER — Encounter: Payer: Self-pay | Admitting: Adult Health

## 2018-11-05 VITALS — BP 153/83 | HR 80 | Ht 64.0 in | Wt 186.0 lb

## 2018-11-05 DIAGNOSIS — I69319 Unspecified symptoms and signs involving cognitive functions following cerebral infarction: Secondary | ICD-10-CM

## 2018-11-05 DIAGNOSIS — E785 Hyperlipidemia, unspecified: Secondary | ICD-10-CM | POA: Diagnosis not present

## 2018-11-05 DIAGNOSIS — I10 Essential (primary) hypertension: Secondary | ICD-10-CM | POA: Diagnosis not present

## 2018-11-05 DIAGNOSIS — I63411 Cerebral infarction due to embolism of right middle cerebral artery: Secondary | ICD-10-CM

## 2018-11-05 DIAGNOSIS — Z95818 Presence of other cardiac implants and grafts: Secondary | ICD-10-CM

## 2018-11-05 NOTE — Progress Notes (Signed)
I agree with the above plan 

## 2018-11-05 NOTE — Patient Instructions (Addendum)
Continue aspirin 81 mg daily and clopidogrel 75 mg daily  and Lipitor for secondary stroke prevention  Discontinue Plavix and continue on aspirin alone after 12/06/2018  Recommend to have PT/OT/ST for continued deficits post stroke - if not currently receiving, please let us know so orders can be placed  Request OT/ST to eval patient further in regards to returning home safely in the near future. At this time, she does not appear to be safe to return home due to cognitive decline and balance issues with use of rollator walker. Unable to determine if she will recover enough to be able to return home safely.   Continue to follow up with PCP regarding cholesterol and blood pressure management   Continue to monitor blood pressure at home  Maintain strict control of hypertension with blood pressure goal below 130/90, diabetes with hemoglobin A1c goal below 6.5% and cholesterol with LDL cholesterol (bad cholesterol) goal below 70 mg/dL. I also advised the patient to eat a healthy diet with plenty of whole grains, cereals, fruits and vegetables, exercise regularly and maintain ideal body weight.  Followup in the future with me in 3 months or call earlier if needed       Thank you for coming to see [place] at [facility]. I hope we have been able to provide you high quality care today.  You may receive a patient satisfaction survey over the next few weeks. We would appreciate your feedback and comments so that we may continue to improve ourselves and the health of our patients.

## 2018-11-05 NOTE — Progress Notes (Addendum)
[facility] [address] [place]. [place] [address] [phone]       OFFICE FOLLOW UP NOTE  Wendy Andrews Date of Birth:  19-Oct-1930 Medical Record Number:  [id number]   Reason for Referral:  hospital stroke follow up  CHIEF COMPLAINT:  Chief Complaint  Patient presents with  . Follow-up    Hospital stroke follow up room in back hallway pt with nephew Maurine Minister she  is at [facility] facility     HPI: Wendy Andrews is being seen today for initial visit in the office for right MCA infarct with embolic pattern secondary to unclear source on 09/05/2018. History obtained from patient, nephew and chart review. Reviewed all radiology images and labs personally.  Wendy Andrews is a 85 y.o. female with history of hypertension and hyperlipidemia who presented with left-sided weakness, visual neglect and stumbling on walking. She did not receive IV t-PA due to outside window.  CT had reviewed and showed acute posterior right MCA infarct.  MRI brain confirmed right MCA infarct.  CTA head and neck showed right M2 occlusion, right V4, basilar artery and left P-comm stenosis.  2D echo showed an EF of 60 to 65%.  Lower extremity venous Doppler negative for DVT.  Due to embolic pattern of infarcts, TEE performed which was unremarkable without evidence of PFO or ASD therefore loop recorder was placed to rule out atrial fibrillation.  LDL 165 and A1c 5.3.  UDS negative.  DAPT initiated with recommendation of continuing for 3 months and then continue aspirin alone due to intracranial stenosis.  Initiated atorvastatin 40 mg for HDL management with elevated LDL.  Therapies recommended SNF due to continued left mild hemiparesis and left simultanagnosia.  Patient discharged to [facility] in stable condition.  Patient is being seen today for hospital follow-up and is accompanied by her nephew. She states she has been doing well since her stroke. She denies continued weakness.  She does use rollator walker for stability concerns.  She is currently residing at [facility] and was transferred there from [facility] on 10/20/18.  Patient states she is not currently receiving any therapies and nephew is unsure if this is accurate.  Nephew does endorse cognitive decline and is unsure if she had any cognitive or memory concerns prior to her stroke as he was not actively involved in her care prior to this event.  She does currently on her own home where she was residing prior independently.  Nephew states that her house was in complete disarray and is concerned regarding her possibly returning home due to safety.  He is currently her POA.  He is questioning whether there will be a chance for cognitive improvement for her to be able to return home or if she will continue to reside in an ALF long-term as she is unable to afford both her home and ALF stay.  She continues on aspirin and Plavix without side effects of bleeding or bruising.  Continues on atorvastatin without side effects of myalgias.  Blood pressure today 153/83. They do monitor at facility but unsure of readings.  Loop recorder has not shown atrial fibrillation thus far.  No further concerns at this time.  Denies new or worsening stroke/TIA symptoms.    ROS:   14 system review of systems performed and negative with exception of memory loss, confusion, and walking difficulty  PMH:  Past Medical History:  Diagnosis Date  . Hyperlipidemia   . Hypertension   .  Insomnia   . Interstitial cystitis   . Stroke [facility])     PSH:  Past Surgical History:  Procedure Laterality Date  . ABDOMINAL HYSTERECTOMY    . CHOLECYSTECTOMY N/A 12/10/2013   Procedure: LAPAROSCOPIC CHOLECYSTECTOMY WITH INTRAOPERATIVE CHOLANGIOGRAM;  Surgeon: Kandis Cocking, MD;  Location: [facility] ORS;  Service: General;  Laterality: N/A;  . LOOP RECORDER INSERTION N/A 09/08/2018   Procedure: LOOP RECORDER INSERTION;  Surgeon: Hillis Range, MD;  Location:  [facility] INVASIVE CV LAB;  Service: Cardiovascular;  Laterality: N/A;  . TEE WITHOUT CARDIOVERSION N/A 09/08/2018   Procedure: TRANSESOPHAGEAL ECHOCARDIOGRAM (TEE);  Surgeon: Elease Hashimoto Deloris Ping, MD;  Location: [facility] ENDOSCOPY;  Service: Cardiovascular;  Laterality: N/A;    Social History:  Social History   Socioeconomic History  . Marital status: Divorced    Spouse name: Not on file  . Number of children: Not on file  . Years of education: Not on file  . Highest education level: Not on file  Occupational History  . Not on file  Social Needs  . Financial resource strain: Not on file  . Food insecurity:    Worry: Not on file    Inability: Not on file  . Transportation needs:    Medical: Not on file    Non-medical: Not on file  Tobacco Use  . Smoking status: Never Smoker  . Smokeless tobacco: Never Used  Substance and Sexual Activity  . Alcohol use: No  . Drug use: No  . Sexual activity: Not on file  Lifestyle  . Physical activity:    Days per week: Not on file    Minutes per session: Not on file  . Stress: Not on file  Relationships  . Social connections:    Talks on phone: Not on file    Gets together: Not on file    Attends religious service: Not on file    Active member of club or organization: Not on file    Attends meetings of clubs or organizations: Not on file    Relationship status: Not on file  . Intimate partner violence:    Fear of current or ex partner: Not on file    Emotionally abused: Not on file    Physically abused: Not on file    Forced sexual activity: Not on file  Other Topics Concern  . Not on file  Social History Narrative  . Not on file    Family History:  Family History  Problem Relation Age of Onset  . Heart failure Mother   . Heart failure Father   . Cancer Sister   . Cancer Brother        lung  . Cancer Brother        brain    Medications:   Current Outpatient Medications on File Prior to Visit  Medication Sig Dispense Refill  .  acetaminophen (TYLENOL) 325 MG tablet Take 2 tablets (650 mg total) by mouth every 4 (four) hours as needed for mild pain (or temp > 37.5 C (99.5 F)).    Marland Kitchen aspirin 325 MG tablet Take 1 tablet (325 mg total) by mouth daily.    Marland Kitchen atorvastatin (LIPITOR) 40 MG tablet Take 1 tablet (40 mg total) by mouth daily at 6 PM. 4 tablet 0  . clopidogrel (PLAVIX) 75 MG tablet Take 1 tablet (75 mg total) by mouth daily. 30 tablet 11  . levothyroxine (SYNTHROID, LEVOTHROID) 75 MCG tablet Take 75 mcg by mouth daily.    Marland Kitchen lisinopril (PRINIVIL,ZESTRIL)  20 MG tablet Take 20 mg by mouth daily.     Marland Kitchen. LORazepam (ATIVAN) 1 MG tablet Take 1 tablet (1 mg total) by mouth daily as needed for anxiety. 4 tablet 0  . meloxicam (MOBIC) 7.5 MG tablet Take 7.5 mg by mouth daily.    . traZODone (DESYREL) 150 MG tablet Take 1 tablet (150 mg total) by mouth at bedtime. 4 tablet 0   No current facility-administered medications on file prior to visit.     Allergies:   Allergies  Allergen Reactions  . Tetanus Toxoids     unknown  . Penicillins Hives and Rash     Physical Exam  Vitals:   11/05/18 0901  BP: (!) 153/83  Pulse: 80  Weight: 186 lb (84.4 kg)  Height: 5\' 4"  (1.626 m)   Body mass index is 31.93 kg/m. No exam data present  General: Obese pleasant elderly Caucasian female, seated, in no evident distress Head: head normocephalic and atraumatic.   Neck: supple with no carotid or supraclavicular bruits Cardiovascular: regular rate and rhythm, no murmurs Musculoskeletal: no deformity Skin:  no rash/petichiae Vascular:  Normal pulses all extremities  Neurologic Exam Mental Status: Awake and fully alert. Disoriented to place and time.  Attention span, concentration and fund of knowledge inappropriate. Mood and affect appropriate and cooperative with exam.  Cranial Nerves: Fundoscopic exam reveals sharp disc margins. Pupils equal, briskly reactive to light. Extraocular movements full without nystagmus. Visual  fields blink to threat.  Hearing intact. Facial sensation intact. Face, tongue, palate moves normally and symmetrically.  Motor: Normal bulk and tone. Normal strength in all tested extremity muscles. Sensory.: intact to touch , pinprick , position and vibratory sensation.  Coordination: Rapid alternating movements normal in all extremities. Finger-to-nose and heel-to-shin performed accurately bilaterally. Gait and Station: Arises from chair with mild difficulty. Stance is normal. Gait demonstrates short cautious steps and balance difficulties with use of Rollator walker Reflexes: 1+ and symmetric. Toes downgoing.    NIHSS  2 Modified Rankin  3    Diagnostic Data (Labs, Imaging, Testing)  Ct Angio Head W Or Wo Contrast Ct Angio Neck W Or Wo Contrast Ct Cerebral Perfusion W Contrast 09/05/2018 IMPRESSION:  1. Positive for LVO: Right MCA posterior M2.  2. However, CTP does not detect the known posterior right MCA core infarct (ASPECTS 7 versus 8 today), and the 19 mL of CTP penumbra seems to largely correspond to the cytotoxic edema.  3. Additionally, there is extensive intracranial atherosclerosis with significant stenoses: - dominant Right Vertebral Artery V4 segment (moderate). - Basilar Artery (moderate). - Left Pcomm, fetal left PCA origin (severe). - dominant Right ACA pericallosal artery (moderate).  4. Aortic Atherosclerosis (ICD10-I70.0) with some ulcerated plaque in the distal arch.    Mr Brain Wo Contrast 09/06/2018 IMPRESSION:  1. Moderate-sized acute posterior right MCA infarct.  No hemorrhage.  2. Extensive chronic small vessel ischemic disease.   Ct Head Code Stroke Wo Contrast 09/05/2018 IMPRESSION:  1. Evidence of acute posterior right MCA territory infarct. No hemorrhage or mass effect.  2. ASPECTS is 7.  3. Other bilateral progressed chronic small vessel disease since May 2019.    Transthoracic Echocardiogram  - Left ventricle: The cavity size was normal.  Wall thickness was increased in a pattern of mild LVH. Systolic function was normal. The estimated ejection fraction was in the range of 60% to 65%. Wall motion was normal; there were no regional wall motion abnormalities. The study is not technically sufficient to  allow evaluation of LV diastolic function. - Mitral valve: Moderately calcified annulus. Severely thickened, severely calcified leaflets . - Atrial septum: There was increased thickness of the septum, consistent with lipomatous hypertrophy.  LE venous Doppler no DVT  TEE  Left Ventrical:Normal LV function  Mitral Valve:normal, Mild MR   Aortic Valve:normal  Tricuspid Valve:normal  Pulmonic Valve:not well vis,  Left Atrium/ Left atrial appendage:no thrombi ,  Atrial septum:no ASD o rPFO  Aorta:mild - mod calcified plaque.   ASSESSMENT: Wendy Andrews is a 85 y.o. year old female here with right MCA infarct embolic pattern on 09/05/2018 secondary to unclear source therefore loop recorder placed to rule out atrial fibrillation. Vascular risk factors include HTN, HLD, intracranial stenosis, obesity and age.  Patient is being seen today for hospital follow-up with continued balance difficulties and cognitive deficit.    PLAN:  1. Right MCA infarct: Continue aspirin 81 mg daily and clopidogrel 75 mg daily  and atorvastatin for secondary stroke prevention.  Advised patient to discontinue Plavix and continue aspirin alone after 12/06/2017 as 30-month DAPT completed.  Maintain strict control of hypertension with blood pressure goal below 130/90, diabetes with hemoglobin A1c goal below 6.5% and cholesterol with LDL cholesterol (bad cholesterol) goal below 70 mg/dL.  I also advised the patient to eat a healthy diet with plenty of whole grains, cereals, fruits and vegetables, exercise regularly with at least 30 minutes of continuous activity daily and maintain ideal body  weight. 2. Cognitive deficit: Discussion with nephew regarding continued cognitive deficit and safety concerns of her living independently.  Advised him that it is difficult to tell amount of cognitive improvement at this time but as she had difficulty living independently prior, she will most likely not be able to return home independently safely.  Recommended to ensure speech therapy is involved for further cognitive assessment and to obtain there recommendations on possibly returning home 3. Balance difficulties: Recommended to ensure PT/OT for continued balance deficits along with evaluation by OT to eval level of functioning and need of ADL assistance.  Nephew was advised that if patient is not currently receiving therapies or are not initiated by next week, to call office so orders can be placed 4. HTN: Advised to continue current treatment regimen.  Today's BP 153/83.  Advised to continue to monitor at home along with continued follow-up with PCP for management 5. HLD: Advised to continue current treatment regimen along with continued follow-up with PCP for future prescribing and monitoring of lipid panel 6. Intracranial stenosis: DAPT for 3 months and continue statin use with continued management of HTN and HLD 7. Loop recorder: Continue to monitor loop recorder for possible atrial fibrillation 8. [employer] research spoke to nephew and patient regarding possible participation in [place] trial    Follow up in 3 months or call earlier if needed   Greater than 50% of time during this 25 minute visit was spent on counseling, explanation of diagnosis of right MCA infarct, reviewing risk factor management of HTN and HLD, planning of further management along with potential future management, and discussion with patient and family answering all questions.    Wendy Andrews, AGNP-BC  [facility] [address] [place], [place] [address]  Phone [phone] Fax  [phone] Note: This document was prepared with digital dictation and possible smart phrase technology. Any transcriptional errors that result from this process are unintentional.

## 2018-12-10 ENCOUNTER — Telehealth: Payer: Self-pay | Admitting: *Deleted

## 2018-12-10 ENCOUNTER — Other Ambulatory Visit: Payer: Self-pay | Admitting: Adult Health

## 2018-12-10 DIAGNOSIS — I63411 Cerebral infarction due to embolism of right middle cerebral artery: Secondary | ICD-10-CM

## 2018-12-10 DIAGNOSIS — I69319 Unspecified symptoms and signs involving cognitive functions following cerebral infarction: Secondary | ICD-10-CM

## 2018-12-10 NOTE — Telephone Encounter (Signed)
Wendy Andrews, with [facility], is here.  States she needs updated orders for patient.  Needs this now. She wants to wait in the lobby for this please.

## 2018-12-10 NOTE — Telephone Encounter (Signed)
George Hugh, NP at 12/10/2018 10:18 AM   Status: Signed    Harriett Sine from [facility] presented to office today requesting PT/OT/ST orders in order for patient to be treated for post stroke deficits.  Ambulatory referrals ordered to undergo PT/OT/ST requesting eval and treat.  Length of visits will be based on insurance approval and therapy recommendations.  Harriett Sine was provided with a printout of referrals.

## 2018-12-10 NOTE — Progress Notes (Signed)
Wendy Andrews from [facility] presented to office today requesting PT/OT/ST orders in order for patient to be treated for post stroke deficits.  Ambulatory referrals ordered to undergo PT/OT/ST requesting eval and treat.  Length of visits will be based on insurance approval and therapy recommendations.  Wendy Andrews was provided with a printout of referrals.

## 2019-02-10 ENCOUNTER — Telehealth: Payer: Self-pay

## 2019-02-10 NOTE — Telephone Encounter (Signed)
I called [facility] facility about pts appt on 02/16/2019 at 0945am. I stated patient visit will be change to video due to COVID 19. They stated the coordinator was in a meeting and will call me back if they can do video visit. [employer] number was left. Contact number is [phone].

## 2019-02-15 NOTE — Telephone Encounter (Signed)
I called [facility] at [phone]. [name] stated pts guardian Maurine Minister gave verbal consent for telephone visit and to file insurance last week. [name] stated the telephone visit will be with Aletha the pts med tech. [name] stated they call on the office on Thursday and gave consent. I stated the staff at [facility] did not document a note. She stated Shanda Bumps NP can call [phone] and ask to speak with [facility] for the visit.

## 2019-02-16 ENCOUNTER — Encounter: Payer: Self-pay | Admitting: Adult Health

## 2019-02-16 ENCOUNTER — Ambulatory Visit (INDEPENDENT_AMBULATORY_CARE_PROVIDER_SITE_OTHER): Payer: Medicare HMO | Admitting: Adult Health

## 2019-02-16 ENCOUNTER — Telehealth: Payer: Self-pay

## 2019-02-16 ENCOUNTER — Telehealth: Payer: Self-pay | Admitting: *Deleted

## 2019-02-16 ENCOUNTER — Other Ambulatory Visit: Payer: Self-pay

## 2019-02-16 DIAGNOSIS — I63411 Cerebral infarction due to embolism of right middle cerebral artery: Secondary | ICD-10-CM | POA: Diagnosis not present

## 2019-02-16 DIAGNOSIS — E785 Hyperlipidemia, unspecified: Secondary | ICD-10-CM | POA: Diagnosis not present

## 2019-02-16 DIAGNOSIS — I10 Essential (primary) hypertension: Secondary | ICD-10-CM | POA: Diagnosis not present

## 2019-02-16 DIAGNOSIS — I69319 Unspecified symptoms and signs involving cognitive functions following cerebral infarction: Secondary | ICD-10-CM | POA: Diagnosis not present

## 2019-02-16 DIAGNOSIS — Z95818 Presence of other cardiac implants and grafts: Secondary | ICD-10-CM

## 2019-02-16 NOTE — Telephone Encounter (Signed)
Office notes fax to facility at [phone]. Fax confirmed.

## 2019-02-16 NOTE — Telephone Encounter (Signed)
-----   Message from George Hugh, NP sent at 02/16/2019 11:08 AM EDT ----- Regarding: RE: loop recorder Okay. Yes I do! I will also forward this to [facility] and [place]. The facility name is [facility] and number [phone]. I spoke to [place] this morning to assist with appointment who is the facility coordinator (very pleasant and helpful!) I appreciate you looking into this! Please let me know if I can be of any further assistance! Thanks again, Shanda Bumps ----- Message ----- From: Hillis Range, MD Sent: 02/16/2019  10:55 AM EDT To: Vinetta Bergamo [place], CMA, Nigel Sloop, RN, # Subject: RE: loop recorder                              Looks like Pricilla Loveless called patient 12-17 (telephone note) and did not receive an answer or have a way to leave a message.  We did not have any other way to contact her.   Do you have contact info for her nursing home?  We are happy to reach out again. ----- Message ----- From: George Hugh, NP Sent: 02/16/2019  10:34 AM EDT To: Hillis Range, MD Subject: loop recorder                                  Good morning Dr. Johney Frame, I had the pleasure of speaking to Ms. Cutbirth for stroke follow-up visit.  She had loop recorder placed in 08/2018 for cryptogenic stroke but last transmission recording on 10/12/2018.  Per facility coordinator, she does not have device at her bedside which was likely misplaced during facility transfers after hospital discharge.  I am wondering who I can contact in regards to her receiving a new transmission device so we can continue to monitor for potential atrial fibrillation.  I attempted to send a message to Azalia Bilis, RN who performed her prior transmission in December but I was unable to send her a message or route her the note.  If you would let me know when you are able, it would be greatly appreciated. Thank you for your time, George Hugh, NP [facility]

## 2019-02-16 NOTE — Progress Notes (Signed)
[facility] [address] [place].  [id number] [phone]     Virtual Visit via Telephone Note  I connected with Wendy LeavellLillian B Andrews on 02/16/19 at  9:45 AM EDT by telephone located remotely within my own home and verified that I am speaking with the correct person using two identifiers who reports being located at her current facility [facility] and assisted by Marchelle FolksAmanda (facility coordinator)..    I discussed the limitations, risks, security and privacy concerns of performing an evaluation and management service by telephone and the availability of in person appointments. I also discussed with the patient that there may be a patient responsible charge related to this service. The patient expressed understanding and agreed to proceed.   History of Present Illness:  Wendy LeavellLillian B Counsell is a 85 y.o. female who has been followed in this office for right MCA infarct in 08/2018.  She was initially scheduled for face-to-face office stroke follow-up visit today but due to COVID19, face-to-face office visit rescheduled for non-face-to-face telephone visit.  [facility] to [facility]. She has been stable from a stroke standpoint with residual deficits of mild balance difficulties and cognitive deficit.  Per Marchelle FolksAmanda (facility coordinator), she has been doing well with improvement of her weakness/balance and cognition.  She continues to ambulate with a rolling walker and denies any recent falls.  She continues to work with in-house PT/OT.  Her memory and cognition have also been improving as she is less confused regarding situation and becoming more comfortable with her current living situation.  At prior visit, it was recommended to discontinue Plavix and continue on aspirin 325 mg alone but per facility coordinator with review of her med list, she has continued on both aspirin 325 mg and Plavix at this time.  She has continued on atorvastatin with recent  cholesterol levels obtained on 11/06/2018 all satisfactory with LDL 63.  Blood pressure monitored 1 time monthly with last reading 147/86.  Last loop recorder report 10/12/2018 which did not show atrial fibrillation.  No recent loop recorder reports and will reach out to cardiology in regards to this.  No further concerns at this time.  Denies new or worsening stroke/TIA symptoms.      Observations/Objective: Limited exam due to visit type  General:  Pleasant elderly Caucasian female answering questions appropriately throughout visit Mental Status: Awake and fully alert.   Oriented to place and time.  Attention span, concentration and fund of knowledge appropriate. Mood and affect appropriate.  LDL 63 (11/06/2018)   Assessment and Plan:  Wendy Andrews is a 85 y.o. year old female here with right MCA infarct embolic pattern on 09/05/2018 secondary to unclear source therefore loop recorder placed to rule out atrial fibrillation. Vascular risk factors include HTN, HLD, intracranial stenosis, obesity and age.    She has been stable from a stroke standpoint.   1. Right MCA infarct: Continue aspirin 325 mg daily and atorvastatin for secondary stroke prevention.  Advised to discontinue Plavix as DAPT completed and no indication for long-term DAPT.  Maintain strict control of hypertension with blood pressure goal below 130/90, diabetes with hemoglobin A1c goal below 6.5% and cholesterol with LDL cholesterol (bad cholesterol) goal below 70 mg/dL.  I also advised the patient to eat a healthy diet with plenty of whole grains, cereals, fruits and vegetables, exercise regularly with at least 30 minutes of continuous activity daily and maintain ideal body weight.  Continue ongoing participation with PT/OT 2. HTN:  Advised to continue current treatment regimen.  Today's BP 153/83.  Advised to continue to monitor at home along with continued follow-up with PCP for management 3. HLD: Advised to continue current  treatment regimen along with continued follow-up with PCP for future prescribing and monitoring of lipid panel 4. Intracranial stenosis: DAPT 3 months completed and ongoing risk factor management 5. Loop recorder:  No recent reports with most recent 10/12/2018 which did not show atrial fibrillation.  We will reach out to cardiology in regards to transmissions. 6. Per review of epic MAR, meloxicam listed for 7.5 mg daily.  This medication is highly recommended to be discontinued due to increased risk of recurrent stroke.  Can use Tylenol as needed for pain management.  Avoid NSAIDs and additional aspirins. 7. Will fax office visit note in order to discontinue clopidogrel 75 mg to SNF at [phone]    Follow Up Instructions:   Follow-up in 6 months or call earlier if needed    I discussed the assessment and treatment plan with the patient.  The patient was provided an opportunity to ask questions and all were answered to their satisfaction. The patient agreed with the plan and verbalized an understanding of the instructions.   I provided 26 minutes of non-face-to-face time during this encounter.    George Hugh, AGNP-BC  [facility] [address] [place], [place] [address]  Phone [phone] Fax [phone] Note: This document was prepared with digital dictation and possible smart phrase technology. Any transcriptional errors that result from this process are unintentional.

## 2019-02-16 NOTE — Telephone Encounter (Signed)
Called [phone] to attempt to reach [facility]--number went straight to unidentified VM. Left generic message requesting call back to DC (no patient information given). Able to lookup correct number for facility--[phone].   Spoke with patient's nurse. She is agreeable to receiving new monitor for patient, requests I send it attn: [place]. Advised I will fax monitor instructions tomorrow. Pt's nurse denies additional concerns at this time.  Claretta Fraise Moseley c/o [facility] [address] [place] [address]  Fax #: [phone]

## 2019-02-16 NOTE — Progress Notes (Signed)
I agree with the above plan 

## 2019-02-17 NOTE — Telephone Encounter (Signed)
Monitor instructions faxed (attn: [name]) as requested. Confirmation received.

## 2019-02-18 ENCOUNTER — Telehealth: Payer: Self-pay | Admitting: Adult Health

## 2019-02-18 NOTE — Telephone Encounter (Signed)
Pt's POA is wanting a update on the pt's last OV. Please advise.

## 2019-02-18 NOTE — Telephone Encounter (Signed)
Care[employer] monitor ordered. Should arrive in 7-10 business days.

## 2019-02-19 ENCOUNTER — Telehealth: Payer: Self-pay | Admitting: Cardiology

## 2019-02-19 NOTE — Telephone Encounter (Signed)
-----   Message from Jessica Vanschaick, NP sent at 02/16/2019 11:08 AM EDT ----- Regarding: RE: loop recorder Okay. Yes I do! I will also forward this to Dal Blew and Emily. The facility name is [facility] and number [phone]. I spoke to Amanda this morning to assist with appointment who is the facility coordinator (very pleasant and helpful!) I appreciate you looking into this! Please let me know if I can be of any further assistance! Thanks again, Jessica ----- Message ----- From: Allred, James, MD Sent: 02/16/2019  10:55 AM EDT To: Fallon Haecker M C[place], CMA, Emily C Miliano, RN, # Subject: RE: loop recorder                              Looks like Kem Gilmore called patient 12-17 (telephone note) and did not receive an answer or have a way to leave a message.  We did not have any other way to contact her.   Do you have contact info for her nursing home?  We are happy to reach out again. ----- Message ----- From: Vanschaick, Jessica, NP Sent: 02/16/2019  10:34 AM EDT To: James Allred, MD Subject: loop recorder                                  Good morning Dr. Allred, I had the pleasure of speaking to Ms. Mexicano for stroke follow-up visit.  She had loop recorder placed in 08/2018 for cryptogenic stroke but last transmission recording on 10/12/2018.  Per facility coordinator, she does not have device at her bedside which was likely misplaced during facility transfers after hospital discharge.  I am wondering who I can contact in regards to her receiving a new transmission device so we can continue to monitor for potential atrial fibrillation.  I attempted to send a message to Elizabeth Watts, RN who performed her prior transmission in December but I was unable to send her a message or route her the note.  If you would let me know when you are able, it would be greatly appreciated. Thank you for your time, Jessica VanSchaick, NP [facility]   

## 2019-02-19 NOTE — Telephone Encounter (Signed)
Spoke with Hilda Lias patient nurse at [facility]. She confirmed that pt did not have home monitor. By her bed. I informed her that I would call [employer] and have a new monitor ordered. Once monitor it will take 7-14 business days to get to her. Instructed pt nurse to call the office once the monitor is received and we will help with setting the monitor up. Nurse verbalized understanding and confirmed that the address to ship the monitor to is: [address] [place], [place] [address]  [address]

## 2019-02-22 NOTE — Telephone Encounter (Signed)
Left vm for Dennis pts POA about visit last week with Shanda Bumps NP and facility via video.

## 2019-02-22 NOTE — Telephone Encounter (Signed)
Fax plavix discontinue med order to [phone] twice and confirmed.

## 2019-02-22 NOTE — Telephone Encounter (Signed)
Wendy Andrews pts POA returning RNs call

## 2019-02-23 NOTE — Telephone Encounter (Signed)
Spoke with [name] at facility. They received monitor and instructions. Advised to send a manual transmission today when possible, then keep monitor plugged in within 42ft of pt's bed to encourage automatic transmissions. Advised to call if they have any issues transmitting. Wendy Andrews verbalizes understanding and agreement with plan.  Will check back later this week to ensure that manual transmission is received.

## 2019-02-23 NOTE — Telephone Encounter (Signed)
Receptionist returning your call.

## 2019-02-24 ENCOUNTER — Ambulatory Visit (INDEPENDENT_AMBULATORY_CARE_PROVIDER_SITE_OTHER): Payer: Medicare HMO | Admitting: *Deleted

## 2019-02-24 DIAGNOSIS — I63511 Cerebral infarction due to unspecified occlusion or stenosis of right middle cerebral artery: Secondary | ICD-10-CM

## 2019-02-24 NOTE — Telephone Encounter (Signed)
I called pts POA Wendy Andrews to give him a overall view of pts video visit with Shanda Bumps NP and the coordinator. I gave him JEssica NP recommendations on plavix being discontinue, and cardiology being consulted about the loop recorder. He verbalized understanding.

## 2019-02-25 LAB — CUP PACEART REMOTE DEVICE CHECK
Date Time Interrogation Session: 20200506185341
Implantable Pulse Generator Implant Date: 20191119

## 2019-02-26 NOTE — Telephone Encounter (Signed)
Transmission received 02/24/19 and added to schedule for processing.

## 2019-03-02 NOTE — Telephone Encounter (Signed)
Home monitor received and updating appropriately.

## 2019-03-02 NOTE — Progress Notes (Signed)
Carelink Summary Report / Loop Recorder 

## 2019-03-26 ENCOUNTER — Ambulatory Visit (INDEPENDENT_AMBULATORY_CARE_PROVIDER_SITE_OTHER): Payer: Medicare HMO | Admitting: *Deleted

## 2019-03-26 DIAGNOSIS — I63511 Cerebral infarction due to unspecified occlusion or stenosis of right middle cerebral artery: Secondary | ICD-10-CM | POA: Diagnosis not present

## 2019-03-26 LAB — CUP PACEART REMOTE DEVICE CHECK
Date Time Interrogation Session: 20200604203901
Implantable Pulse Generator Implant Date: 20191119

## 2019-04-02 NOTE — Progress Notes (Signed)
Carelink Summary Report / Loop Recorder 

## 2019-04-06 ENCOUNTER — Telehealth: Payer: Self-pay | Admitting: Cardiology

## 2019-04-06 NOTE — Telephone Encounter (Signed)
Called pt to request a manual transmission to review episodes further. Spoke w/ pt nurse and instructed her how to send a manual transmission w/ pt home monitor. Transmission received.

## 2019-04-06 NOTE — Telephone Encounter (Signed)
Full transmission reviewed. All AF episodes false.

## 2019-04-20 ENCOUNTER — Telehealth: Payer: Self-pay

## 2019-04-20 NOTE — Telephone Encounter (Signed)
Spoke to Wendy Andrews at [facility], informed her that I reviewed the transmission and that the alerts for AF are false. Available ECGs suggest SR w/ ectopy/artifact. Will see the pt 04/29/19 for reprogramming.

## 2019-04-20 NOTE — Telephone Encounter (Signed)
Spoke to [place], states pt is at [facility] at [facility].  Called facility and spoke to [facility], requested manual transmission to assess all "AF" episodes. Per Estill Bamberg, will get pt inside and send manual transmission. Gave her direct DC number if assistance with manual transmission is needed. Scheduled pt for DC appt on 04/29/19 @ 1530.

## 2019-04-20 NOTE — Telephone Encounter (Signed)
Wendy Andrews from the facility called while she was doing the transmission for the pt. Transmission received. I told her the nurse will look at the transmission and give them a call back at the facility.

## 2019-04-28 ENCOUNTER — Telehealth: Payer: Self-pay

## 2019-04-28 ENCOUNTER — Ambulatory Visit (INDEPENDENT_AMBULATORY_CARE_PROVIDER_SITE_OTHER): Payer: Medicare HMO | Admitting: *Deleted

## 2019-04-28 DIAGNOSIS — I63511 Cerebral infarction due to unspecified occlusion or stenosis of right middle cerebral artery: Secondary | ICD-10-CM | POA: Diagnosis not present

## 2019-04-28 LAB — CUP PACEART REMOTE DEVICE CHECK
Date Time Interrogation Session: 20200707214033
Implantable Pulse Generator Implant Date: 20191119

## 2019-04-28 NOTE — Telephone Encounter (Signed)
    COVID-19 Pre-Screening Questions:  . In the past 7 to 10 days have you had a cough,  shortness of breath, headache, congestion, fever (100 or greater) body aches, chills, sore throat, or sudden loss of taste or sense of smell? No . Have you been around anyone with known Covid 19. NO . Have you been around anyone who is awaiting Covid 19 test results in the past 7 to 10 days? No . Have you been around anyone who has been exposed to Covid 19, or has mentioned symptoms of Covid 19 within the past 7 to 10 days? No  If you have any concerns/questions about symptoms patients report during screening (either on the phone or at threshold). Contact the provider seeing the patient or DOD for further guidance.  If neither are available contact a member of the leadership team.       Pt nurse answered No to all Covid-19 prescreening questions. I asked her to make sure the pt wear a mask to her appointment. I also told her that we are limiting the amount of people into the office. The pt verbalized understanding.

## 2019-04-29 ENCOUNTER — Ambulatory Visit: Payer: Medicare HMO

## 2019-04-29 LAB — CUP PACEART REMOTE DEVICE CHECK
Date Time Interrogation Session: 20200709040500
Implantable Pulse Generator Implant Date: 20191119

## 2019-05-10 NOTE — Progress Notes (Signed)
Carelink Summary Report / Loop Recorder 

## 2019-05-26 ENCOUNTER — Telehealth: Payer: Self-pay | Admitting: *Deleted

## 2019-05-26 NOTE — Telephone Encounter (Signed)
Spoke with receptionist at [facility] regarding patient's missed appointments on 7/9 and 8/3 for reprogramming. She reports they have no record of these appointments.  Facility has transportation available on Tues/Thurs. Scheduled for DC appointment on 06/10/19 at 10:00am. Gave office address and phone number. Confirmed all information with receptionist. No further questions at this time.

## 2019-05-31 ENCOUNTER — Ambulatory Visit (INDEPENDENT_AMBULATORY_CARE_PROVIDER_SITE_OTHER): Payer: Medicare HMO | Admitting: *Deleted

## 2019-05-31 DIAGNOSIS — I63511 Cerebral infarction due to unspecified occlusion or stenosis of right middle cerebral artery: Secondary | ICD-10-CM

## 2019-05-31 LAB — CUP PACEART REMOTE DEVICE CHECK
Date Time Interrogation Session: 20200809224055
Implantable Pulse Generator Implant Date: 20191119

## 2019-06-08 NOTE — Progress Notes (Signed)
Carelink Summary Report / Loop Recorder 

## 2019-06-09 ENCOUNTER — Telehealth: Payer: Self-pay

## 2019-06-09 NOTE — Telephone Encounter (Signed)
    COVID-19 Pre-Screening Questions:  . In the past 7 to 10 days have you had a cough,  shortness of breath, headache, congestion, fever (100 or greater) body aches, chills, sore throat, or sudden loss of taste or sense of smell?No . Have you been around anyone with known Covid 19. No . Have you been around anyone who is awaiting Covid 19 test results in the past 7 to 10 days? No . Have you been around anyone who has been exposed to Covid 19, or has mentioned symptoms of Covid 19 within the past 7 to 10 days? No  If you have any concerns/questions about symptoms patients report during screening (either on the phone or at threshold). Contact the provider seeing the patient or DOD for further guidance.  If neither are available contact a member of the leadership team.          Di Kindle from the facility answered no to all covid-19 prescreening questions. I asked her to make sure the pt wear a mask for her appointment. I told her that if the patient can physically walk into her appointment we asked the she do so. I told her if anything changes between now and her appointment time to call and let us know. Di Kindle verbalized understanding.

## 2019-06-10 ENCOUNTER — Other Ambulatory Visit: Payer: Self-pay

## 2019-06-10 ENCOUNTER — Ambulatory Visit (INDEPENDENT_AMBULATORY_CARE_PROVIDER_SITE_OTHER): Payer: Medicare HMO | Admitting: Student

## 2019-06-10 DIAGNOSIS — I639 Cerebral infarction, unspecified: Secondary | ICD-10-CM

## 2019-06-10 LAB — CUP PACEART REMOTE DEVICE CHECK
Date Time Interrogation Session: 20200820121524
Implantable Pulse Generator Implant Date: 20191119

## 2019-06-10 LAB — CUP PACEART INCLINIC DEVICE CHECK
Date Time Interrogation Session: 20200820102223
Implantable Pulse Generator Implant Date: 20191119

## 2019-06-10 NOTE — Progress Notes (Signed)
Loop check in clinic. Battery status: OK. R-waves 0.3 mV. 0 symptom episodes, 0 tachy episodes, 0 pause episodes, 0 brady episodes. 125 AF episodes (0.5% burden). All reviewed and appear NSR with ectopy/PACs. Longest individual episode 22 minutes, but reviewed at length and consistent with SR and PACs. Decreased to "less sensitive" and episodes to > 6 minutes. Monthly summary reports and ROV as needed.   Legrand Como [address]" [place], PA-C 06/10/2019 10:24 AM

## 2019-07-02 ENCOUNTER — Ambulatory Visit (INDEPENDENT_AMBULATORY_CARE_PROVIDER_SITE_OTHER): Payer: Medicare HMO | Admitting: *Deleted

## 2019-07-02 DIAGNOSIS — I63511 Cerebral infarction due to unspecified occlusion or stenosis of right middle cerebral artery: Secondary | ICD-10-CM

## 2019-07-04 LAB — CUP PACEART REMOTE DEVICE CHECK
Date Time Interrogation Session: 20200911234144
Implantable Pulse Generator Implant Date: 20191119

## 2019-07-09 NOTE — Progress Notes (Signed)
Carelink Summary Report / Loop Recorder 

## 2019-08-04 ENCOUNTER — Ambulatory Visit (INDEPENDENT_AMBULATORY_CARE_PROVIDER_SITE_OTHER): Payer: Medicare HMO | Admitting: *Deleted

## 2019-08-04 DIAGNOSIS — I63511 Cerebral infarction due to unspecified occlusion or stenosis of right middle cerebral artery: Secondary | ICD-10-CM | POA: Diagnosis not present

## 2019-08-05 LAB — CUP PACEART REMOTE DEVICE CHECK
Date Time Interrogation Session: 20201014234405
Implantable Pulse Generator Implant Date: 20191119

## 2019-08-17 NOTE — Progress Notes (Signed)
Carelink Summary Report / Loop Recorder 

## 2019-08-19 ENCOUNTER — Encounter: Payer: Self-pay | Admitting: Adult Health

## 2019-08-19 ENCOUNTER — Other Ambulatory Visit: Payer: Self-pay

## 2019-08-19 ENCOUNTER — Ambulatory Visit (INDEPENDENT_AMBULATORY_CARE_PROVIDER_SITE_OTHER): Payer: Medicare HMO | Admitting: Adult Health

## 2019-08-19 VITALS — BP 146/76 | HR 70 | Temp 97.5°F | Ht 64.0 in | Wt 181.8 lb

## 2019-08-19 DIAGNOSIS — I63411 Cerebral infarction due to embolism of right middle cerebral artery: Secondary | ICD-10-CM | POA: Diagnosis not present

## 2019-08-19 DIAGNOSIS — I69319 Unspecified symptoms and signs involving cognitive functions following cerebral infarction: Secondary | ICD-10-CM | POA: Diagnosis not present

## 2019-08-19 DIAGNOSIS — I10 Essential (primary) hypertension: Secondary | ICD-10-CM

## 2019-08-19 DIAGNOSIS — E785 Hyperlipidemia, unspecified: Secondary | ICD-10-CM

## 2019-08-19 DIAGNOSIS — Z95818 Presence of other cardiac implants and grafts: Secondary | ICD-10-CM

## 2019-08-19 NOTE — Progress Notes (Signed)
[facility] [address] [place]. [place] [address] [phone]       OFFICE FOLLOW UP NOTE  Ms. ELLORY KHURANA Date of Birth:  1929-10-24 Medical Record Number:  [id number]   Reason for Referral:  stroke follow up  CHIEF COMPLAINT:  Chief Complaint  Patient presents with   Follow-up    Stroke, doing ok.Aurther Loft with pt.  Rm hall txt rm.    HPI: Stroke admission 09/05/2018: Ms. Wendy Andrews is a 85 y.o. female with history of hypertension and hyperlipidemia who presented with left-sided weakness, visual neglect and stumbling on walking. She did not receive IV t-PA due to outside window.  CT had reviewed and showed acute posterior right MCA infarct.  MRI brain confirmed right MCA infarct.  CTA head and neck showed right M2 occlusion, right V4, basilar artery and left P-comm stenosis.  2D echo showed an EF of 60 to 65%.  Lower extremity venous Doppler negative for DVT.  Due to embolic pattern of infarcts, TEE performed which was unremarkable without evidence of PFO or ASD therefore loop recorder was placed to rule out atrial fibrillation.  LDL 165 and A1c 5.3.  UDS negative.  DAPT initiated with recommendation of continuing for 3 months and then continue aspirin alone due to intracranial stenosis.  Initiated atorvastatin 40 mg for HDL management with elevated LDL.  Therapies recommended SNF due to continued left mild hemiparesis and left simultanagnosia.  Patient discharged to [facility] in stable condition.  Patient is being seen today for hospital follow-up and is accompanied by her nephew. She states she has been doing well since her stroke. She denies continued weakness. She does use rollator walker for stability concerns.  She is currently residing at [facility] and was transferred there from [facility] on 10/20/18.  Patient states she is not currently receiving any therapies and nephew is unsure if this is accurate.  Nephew does endorse cognitive  decline and is unsure if she had any cognitive or memory concerns prior to her stroke as he was not actively involved in her care prior to this event.  She does currently on her own home where she was residing prior independently.  Nephew states that her house was in complete disarray and is concerned regarding her possibly returning home due to safety.  He is currently her POA.  He is questioning whether there will be a chance for cognitive improvement for her to be able to return home or if she will continue to reside in an ALF long-term as she is unable to afford both her home and ALF stay.  She continues on aspirin and Plavix without side effects of bleeding or bruising.  Continues on atorvastatin without side effects of myalgias.  Blood pressure today 153/83. They do monitor at facility but unsure of readings.  Loop recorder has not shown atrial fibrillation thus far.  No further concerns at this time.  Denies new or worsening stroke/TIA symptoms.  Virtual visit 02/16/2019: She continues to reside at [facility]. She has been stable from a stroke standpoint with residual deficits of mild balance difficulties and cognitive deficit.  Per [name] (facility coordinator), she has been doing well with improvement of her weakness/balance and cognition.  She continues to ambulate with a rolling walker and denies any recent falls.  She continues to work with in-house PT/OT.  Her memory and cognition have also been improving as she is less confused regarding situation and becoming more  comfortable with her current living situation.  At prior visit, it was recommended to discontinue Plavix and continue on aspirin 325 mg alone but per facility coordinator with review of her med list, she has continued on both aspirin 325 mg and Plavix at this time.  She has continued on atorvastatin with recent cholesterol levels obtained on 11/06/2018 all satisfactory with LDL 63.  Blood pressure monitored 1 time monthly with last  reading 147/86.  Last loop recorder report 10/12/2018 which did not show atrial fibrillation.  No recent loop recorder reports and will reach out to cardiology in regards to this.  No further concerns at this time.  Denies new or worsening stroke/TIA symptoms.  Update 08/19/2019: Ms. Wendy Andrews is an 85 year old female who is being seen today for stroke follow-up accompanied by her nephew.  She has been doing well from a stroke standpoint.  She continues to reside at [facility].  Continues to ambulate with rolling walker denies any recent falls.  Continues to work with physical therapy.  Cognition has been overall stable with occasional wax/wane of symptoms.  Continues on aspirin and atorvastatin for secondary stroke prevention without side effects.  Blood pressure today initially elevated and on recheck 146/76.  Loop recorder has not shown atrial fibrillation thus far.  No further concerns at this time.     ROS:   14 system review of systems performed and negative with exception of memory loss, confusion, and walking difficulty  PMH:  Past Medical History:  Diagnosis Date   Hyperlipidemia    Hypertension    Insomnia    Interstitial cystitis    Stroke (HCC)     PSH:  Past Surgical History:  Procedure Laterality Date   ABDOMINAL HYSTERECTOMY     CHOLECYSTECTOMY N/A 12/10/2013   Procedure: LAPAROSCOPIC CHOLECYSTECTOMY WITH INTRAOPERATIVE CHOLANGIOGRAM;  Surgeon: Kandis Cockingavid H Newman, MD;  Location: [facility] ORS;  Service: General;  Laterality: N/A;   LOOP RECORDER INSERTION N/A 09/08/2018   Procedure: LOOP RECORDER INSERTION;  Surgeon: Hillis RangeAllred, James, MD;  Location: [facility] INVASIVE CV LAB;  Service: Cardiovascular;  Laterality: N/A;   TEE WITHOUT CARDIOVERSION N/A 09/08/2018   Procedure: TRANSESOPHAGEAL ECHOCARDIOGRAM (TEE);  Surgeon: Elease HashimotoNahser, Deloris PingPhilip J, MD;  [facility];  Service: Cardiovascular;  Laterality: N/A;    Social History:  Social History   Socioeconomic History    Marital status: Divorced    Spouse name: Not on file   Number of children: Not on file   Years of education: Not on file   Highest education level: Not on file  Occupational History   Not on file  Social Needs   Financial resource strain: Not on file   Food insecurity    Worry: Not on file    Inability: Not on file   Transportation needs    Medical: Not on file    Non-medical: Not on file  Tobacco Use   Smoking status: Never Smoker   Smokeless tobacco: Never Used  Substance and Sexual Activity   Alcohol use: No   Drug use: No   Sexual activity: Not on file  Lifestyle   Physical activity    Days per week: Not on file    Minutes per session: Not on file   Stress: Not on file  Relationships   Social connections    Talks on phone: Not on file    Gets together: Not on file    Attends religious service: Not on file    Active member of club or  organization: Not on file    Attends meetings of clubs or organizations: Not on file    Relationship status: Not on file   Intimate partner violence    Fear of current or ex partner: Not on file    Emotionally abused: Not on file    Physically abused: Not on file    Forced sexual activity: Not on file  Other Topics Concern   Not on file  Social History Narrative   Not on file    Family History:  Family History  Problem Relation Age of Onset   Heart failure Mother    Heart failure Father    Cancer Sister    Cancer Brother        lung   Cancer Brother        brain    Medications:   Current Outpatient Medications on File Prior to Visit  Medication Sig Dispense Refill   acetaminophen (TYLENOL) 325 MG tablet Take 2 tablets (650 mg total) by mouth every 4 (four) hours as needed for mild pain (or temp > 37.5 C (99.5 F)).     aspirin EC 81 MG tablet Take 81 mg by mouth daily.     atorvastatin (LIPITOR) 40 MG tablet Take 1 tablet (40 mg total) by mouth daily at 6 PM. 4 tablet 0   levothyroxine  (SYNTHROID, LEVOTHROID) 75 MCG tablet Take 75 mcg by mouth daily.     lisinopril (PRINIVIL,ZESTRIL) 20 MG tablet Take 20 mg by mouth daily.      meloxicam (MOBIC) 7.5 MG tablet Take 7.5 mg by mouth daily.     traZODone (DESYREL) 150 MG tablet Take 1 tablet (150 mg total) by mouth at bedtime. 4 tablet 0   LORazepam (ATIVAN) 1 MG tablet Take 1 tablet (1 mg total) by mouth daily as needed for anxiety. (Patient not taking: Reported on 08/19/2019) 4 tablet 0   No current facility-administered medications on file prior to visit.     Allergies:   Allergies  Allergen Reactions   Tetanus Toxoids     unknown   Penicillins Hives and Rash     Physical Exam  Vitals:   08/19/19 1106  BP: (!) 169/96  Pulse: 70  Temp: (!) 97.5 F (36.4 C)  Weight: 181 lb 12.8 oz (82.5 kg)  Height: 5\' 4"  (1.626 m)   Body mass index is 31.21 kg/m. No exam data present  General: Obese pleasant elderly Caucasian female, seated, in no evident distress Head: head normocephalic and atraumatic.   Neck: supple with no carotid or supraclavicular bruits Cardiovascular: regular rate and rhythm, no murmurs Musculoskeletal: no deformity Skin:  no rash/petichiae Vascular:  Normal pulses all extremities  Neurologic Exam Mental Status: Awake and fully alert. Disoriented to place but oriented to time.  Attention span, concentration and fund of knowledge diminished. Mood and affect appropriate and cooperative with exam.  Cranial Nerves: Pupils equal, briskly reactive to light. Extraocular movements full without nystagmus. Visual fields blink to threat.  Hearing intact. Facial sensation intact. Face, tongue, palate moves normally and symmetrically.  Motor: Normal bulk and tone. Normal strength in all tested extremity muscles. Sensory.: intact to touch , pinprick , position and vibratory sensation.  Coordination: Rapid alternating movements normal in all extremities. Finger-to-nose and heel-to-shin performed  accurately bilaterally. Gait and Station: Arises from chair with mild difficulty. Stance is slightly hunched. Gait demonstrates short cautious steps and balance difficulties with use of Rollator walker Reflexes: 1+ and symmetric. Toes downgoing.  Diagnostic Data (Labs, Imaging, Testing)  Ct Angio Head W Or Wo Contrast Ct Angio Neck W Or Wo Contrast Ct Cerebral Perfusion W Contrast 09/05/2018 IMPRESSION:  1. Positive for LVO: Right MCA posterior M2.  2. However, CTP does not detect the known posterior right MCA core infarct (ASPECTS 7 versus 8 today), and the 19 mL of CTP penumbra seems to largely correspond to the cytotoxic edema.  3. Additionally, there is extensive intracranial atherosclerosis with significant stenoses: - dominant Right Vertebral Artery V4 segment (moderate). - Basilar Artery (moderate). - Left Pcomm, fetal left PCA origin (severe). - dominant Right ACA pericallosal artery (moderate).  4. Aortic Atherosclerosis (ICD10-I70.0) with some ulcerated plaque in the distal arch.    Mr Brain Wo Contrast 09/06/2018 IMPRESSION:  1. Moderate-sized acute posterior right MCA infarct.  No hemorrhage.  2. Extensive chronic small vessel ischemic disease.   Ct Head Code Stroke Wo Contrast 09/05/2018 IMPRESSION:  1. Evidence of acute posterior right MCA territory infarct. No hemorrhage or mass effect.  2. ASPECTS is 7.  3. Other bilateral progressed chronic small vessel disease since May 2019.    Transthoracic Echocardiogram  - Left ventricle: The cavity size was normal. Wall thickness was increased in a pattern of mild LVH. Systolic function was normal. The estimated ejection fraction was in the range of 60% to 65%. Wall motion was normal; there were no regional wall motion abnormalities. The study is not technically sufficient to allow evaluation of LV diastolic function. - Mitral valve: Moderately calcified annulus. Severely thickened, severely  calcified leaflets . - Atrial septum: There was increased thickness of the septum, consistent with lipomatous hypertrophy.  LE venous Doppler no DVT  TEE  Left Ventrical:Normal LV function  Mitral Valve:normal, Mild MR   Aortic Valve:normal  Tricuspid Valve:normal  Pulmonic Valve:not well vis,  Left Atrium/ Left atrial appendage:no thrombi ,  Atrial septum:no ASD o rPFO  Aorta:mild - mod calcified plaque.   ASSESSMENT: Wendy Andrews is a 85 y.o. year old female here with right MCA infarct embolic pattern on 23/53/6144 secondary to unclear source therefore loop recorder placed to rule out atrial fibrillation. Vascular risk factors include HTN, HLD, intracranial stenosis, obesity and age.  Residual deficits of mild cognitive impairment but overall stable    PLAN:  1. Right MCA infarct: Continue aspirin 81 mg daily  and atorvastatin for secondary stroke prevention.   Maintain strict control of hypertension with blood pressure goal below 130/90, diabetes with hemoglobin A1c goal below 6.5% and cholesterol with LDL cholesterol (bad cholesterol) goal below 70 mg/dL.  I also advised the patient to eat a healthy diet with plenty of whole grains, cereals, fruits and vegetables, exercise regularly with at least 30 minutes of continuous activity daily and maintain ideal body weight.  Continue to monitor loop recorder for atrial fibrillation 2. HTN: Advised to continue current treatment regimen.  Today's BP stable.  Advised to continue to monitor at home along with continued follow-up with PCP for management 3. HLD: Advised to continue current treatment regimen along with continued follow-up with PCP for future prescribing and monitoring of lipid panel 4. Intracranial stenosis: DAPT for 3 months and then single agent alone and continue statin use with continued management of HTN and HLD   Follow-up as needed   Greater than 50% of time during this 25 minute  visit was spent on counseling, explanation of diagnosis of right MCA infarct, reviewing risk factor management of HTN and HLD, planning of further management along  with potential future management, and discussion with patient and family answering all questions.    Ihor Austin, AGNP-BC  [facility] [address] [place], [place] [address]  Phone [phone] Fax [phone] Note: This document was prepared with digital dictation and possible smart phrase technology. Any transcriptional errors that result from this process are unintentional.

## 2019-08-19 NOTE — Progress Notes (Signed)
I agree with the above plan 

## 2019-08-19 NOTE — Patient Instructions (Signed)
Continue aspirin 81 mg daily  and Lipitor for secondary stroke prevention  Continue to follow up with PCP regarding cholesterol and blood pressure management   Continue to monitor blood pressure at home  Maintain strict control of hypertension with blood pressure goal below 130/90, diabetes with hemoglobin A1c goal below 6.5% and cholesterol with LDL cholesterol (bad cholesterol) goal below 70 mg/dL. I also advised the patient to eat a healthy diet with plenty of whole grains, cereals, fruits and vegetables, exercise regularly and maintain ideal body weight.        Thank you for coming to see us at [facility]. I hope we have been able to provide you high quality care today.  You may receive a patient satisfaction survey over the next few weeks. We would appreciate your feedback and comments so that we may continue to improve ourselves and the health of our patients.  

## 2019-09-06 ENCOUNTER — Ambulatory Visit (INDEPENDENT_AMBULATORY_CARE_PROVIDER_SITE_OTHER): Payer: Medicare HMO | Admitting: *Deleted

## 2019-09-06 DIAGNOSIS — I63511 Cerebral infarction due to unspecified occlusion or stenosis of right middle cerebral artery: Secondary | ICD-10-CM

## 2019-09-07 LAB — CUP PACEART REMOTE DEVICE CHECK
Date Time Interrogation Session: 20201116234144
Implantable Pulse Generator Implant Date: 20191119

## 2019-10-01 NOTE — Progress Notes (Signed)
Carelink Summary Report / Loop Recorder 

## 2019-10-11 ENCOUNTER — Ambulatory Visit (INDEPENDENT_AMBULATORY_CARE_PROVIDER_SITE_OTHER): Payer: Medicare HMO | Admitting: *Deleted

## 2019-10-11 DIAGNOSIS — I63511 Cerebral infarction due to unspecified occlusion or stenosis of right middle cerebral artery: Secondary | ICD-10-CM

## 2019-10-11 LAB — CUP PACEART REMOTE DEVICE CHECK
Date Time Interrogation Session: 20201219184650
Implantable Pulse Generator Implant Date: 20191119

## 2019-11-15 ENCOUNTER — Ambulatory Visit (INDEPENDENT_AMBULATORY_CARE_PROVIDER_SITE_OTHER): Payer: Medicare HMO | Admitting: *Deleted

## 2019-11-15 DIAGNOSIS — I63511 Cerebral infarction due to unspecified occlusion or stenosis of right middle cerebral artery: Secondary | ICD-10-CM

## 2019-11-15 LAB — CUP PACEART REMOTE DEVICE CHECK
Date Time Interrogation Session: 20210124232825
Implantable Pulse Generator Implant Date: 20191119

## 2019-12-20 ENCOUNTER — Ambulatory Visit (INDEPENDENT_AMBULATORY_CARE_PROVIDER_SITE_OTHER): Payer: Medicare HMO | Admitting: *Deleted

## 2019-12-20 DIAGNOSIS — I63511 Cerebral infarction due to unspecified occlusion or stenosis of right middle cerebral artery: Secondary | ICD-10-CM | POA: Diagnosis not present

## 2019-12-20 LAB — CUP PACEART REMOTE DEVICE CHECK
Date Time Interrogation Session: 20210228235653
Implantable Pulse Generator Implant Date: 20191119

## 2019-12-21 NOTE — Progress Notes (Signed)
ILR Remote 

## 2020-01-20 ENCOUNTER — Ambulatory Visit (INDEPENDENT_AMBULATORY_CARE_PROVIDER_SITE_OTHER): Payer: Medicare HMO | Admitting: *Deleted

## 2020-01-20 DIAGNOSIS — I63511 Cerebral infarction due to unspecified occlusion or stenosis of right middle cerebral artery: Secondary | ICD-10-CM | POA: Diagnosis not present

## 2020-01-20 LAB — CUP PACEART REMOTE DEVICE CHECK
Date Time Interrogation Session: 20210401010021
Implantable Pulse Generator Implant Date: 20191119

## 2020-01-20 NOTE — Progress Notes (Signed)
ILR Remote 

## 2020-02-21 ENCOUNTER — Ambulatory Visit (INDEPENDENT_AMBULATORY_CARE_PROVIDER_SITE_OTHER): Payer: Medicare HMO | Admitting: *Deleted

## 2020-02-21 DIAGNOSIS — I63511 Cerebral infarction due to unspecified occlusion or stenosis of right middle cerebral artery: Secondary | ICD-10-CM | POA: Diagnosis not present

## 2020-02-21 LAB — CUP PACEART REMOTE DEVICE CHECK
Date Time Interrogation Session: 20210502011008
Implantable Pulse Generator Implant Date: 20191119

## 2020-02-22 NOTE — Progress Notes (Signed)
Carelink Summary Report / Loop Recorder 

## 2020-03-23 ENCOUNTER — Ambulatory Visit (INDEPENDENT_AMBULATORY_CARE_PROVIDER_SITE_OTHER): Payer: Medicare HMO | Admitting: *Deleted

## 2020-03-23 DIAGNOSIS — I63511 Cerebral infarction due to unspecified occlusion or stenosis of right middle cerebral artery: Secondary | ICD-10-CM | POA: Diagnosis not present

## 2020-03-23 LAB — CUP PACEART REMOTE DEVICE CHECK
Date Time Interrogation Session: 20210602231448
Implantable Pulse Generator Implant Date: 20191119

## 2020-03-28 NOTE — Progress Notes (Signed)
Carelink Summary Report / Loop Recorder 

## 2020-04-25 ENCOUNTER — Ambulatory Visit (INDEPENDENT_AMBULATORY_CARE_PROVIDER_SITE_OTHER): Payer: Medicare HMO | Admitting: *Deleted

## 2020-04-25 DIAGNOSIS — I63511 Cerebral infarction due to unspecified occlusion or stenosis of right middle cerebral artery: Secondary | ICD-10-CM | POA: Diagnosis not present

## 2020-04-25 LAB — CUP PACEART REMOTE DEVICE CHECK
Date Time Interrogation Session: 20210706023103
Implantable Pulse Generator Implant Date: 20191119

## 2020-04-27 NOTE — Progress Notes (Signed)
Carelink Summary Report / Loop Recorder 

## 2020-05-25 ENCOUNTER — Ambulatory Visit (INDEPENDENT_AMBULATORY_CARE_PROVIDER_SITE_OTHER): Payer: Medicare HMO | Admitting: *Deleted

## 2020-05-25 DIAGNOSIS — I63511 Cerebral infarction due to unspecified occlusion or stenosis of right middle cerebral artery: Secondary | ICD-10-CM | POA: Diagnosis not present

## 2020-05-29 LAB — CUP PACEART REMOTE DEVICE CHECK
Date Time Interrogation Session: 20210808023007
Implantable Pulse Generator Implant Date: 20191119

## 2020-05-29 NOTE — Progress Notes (Signed)
Carelink Summary Report / Loop Recorder 

## 2020-06-27 ENCOUNTER — Ambulatory Visit (INDEPENDENT_AMBULATORY_CARE_PROVIDER_SITE_OTHER): Payer: Medicare HMO | Admitting: *Deleted

## 2020-06-27 DIAGNOSIS — I63511 Cerebral infarction due to unspecified occlusion or stenosis of right middle cerebral artery: Secondary | ICD-10-CM

## 2020-06-30 LAB — CUP PACEART REMOTE DEVICE CHECK
Date Time Interrogation Session: 20210910023310
Implantable Pulse Generator Implant Date: 20191119

## 2020-06-30 NOTE — Progress Notes (Signed)
Carelink Summary Report / Loop Recorder 

## 2020-07-27 ENCOUNTER — Ambulatory Visit: Payer: Medicare HMO

## 2020-08-02 LAB — CUP PACEART REMOTE DEVICE CHECK
Date Time Interrogation Session: 20211013023428
Implantable Pulse Generator Implant Date: 20191119

## 2020-08-28 ENCOUNTER — Ambulatory Visit (INDEPENDENT_AMBULATORY_CARE_PROVIDER_SITE_OTHER): Payer: Medicare HMO

## 2020-08-28 DIAGNOSIS — I639 Cerebral infarction, unspecified: Secondary | ICD-10-CM

## 2020-08-29 LAB — CUP PACEART REMOTE DEVICE CHECK
Date Time Interrogation Session: 20211107233649
Implantable Pulse Generator Implant Date: 20191119

## 2020-08-30 NOTE — Progress Notes (Signed)
Carelink Summary Report / Loop Recorder 

## 2020-09-28 ENCOUNTER — Ambulatory Visit (INDEPENDENT_AMBULATORY_CARE_PROVIDER_SITE_OTHER): Payer: Medicare HMO

## 2020-09-28 DIAGNOSIS — I639 Cerebral infarction, unspecified: Secondary | ICD-10-CM

## 2020-09-30 LAB — CUP PACEART REMOTE DEVICE CHECK
Date Time Interrogation Session: 20211210233620
Implantable Pulse Generator Implant Date: 20191119

## 2020-10-11 NOTE — Progress Notes (Signed)
Carelink Summary Report / Loop Recorder 

## 2020-11-02 ENCOUNTER — Ambulatory Visit (INDEPENDENT_AMBULATORY_CARE_PROVIDER_SITE_OTHER): Payer: Medicare HMO

## 2020-11-02 DIAGNOSIS — I63511 Cerebral infarction due to unspecified occlusion or stenosis of right middle cerebral artery: Secondary | ICD-10-CM | POA: Diagnosis not present

## 2020-11-02 LAB — CUP PACEART REMOTE DEVICE CHECK
Date Time Interrogation Session: 20220112235019
Implantable Pulse Generator Implant Date: 20191119

## 2020-11-16 NOTE — Progress Notes (Signed)
Carelink Summary Report / Loop Recorder 

## 2020-12-04 ENCOUNTER — Ambulatory Visit (INDEPENDENT_AMBULATORY_CARE_PROVIDER_SITE_OTHER): Payer: Medicare HMO

## 2020-12-04 DIAGNOSIS — I639 Cerebral infarction, unspecified: Secondary | ICD-10-CM | POA: Diagnosis not present

## 2020-12-05 LAB — CUP PACEART REMOTE DEVICE CHECK
Date Time Interrogation Session: 20220214235122
Implantable Pulse Generator Implant Date: 20191119

## 2020-12-11 NOTE — Progress Notes (Signed)
Carelink Summary Report / Loop Recorder 

## 2021-01-04 ENCOUNTER — Ambulatory Visit (INDEPENDENT_AMBULATORY_CARE_PROVIDER_SITE_OTHER): Payer: Medicare HMO

## 2021-01-04 DIAGNOSIS — I639 Cerebral infarction, unspecified: Secondary | ICD-10-CM

## 2021-01-08 LAB — CUP PACEART REMOTE DEVICE CHECK
Date Time Interrogation Session: 20220320005425
Implantable Pulse Generator Implant Date: 20191119

## 2021-01-12 NOTE — Progress Notes (Signed)
Carelink Summary Report / Loop Recorder 

## 2021-02-05 ENCOUNTER — Ambulatory Visit (INDEPENDENT_AMBULATORY_CARE_PROVIDER_SITE_OTHER): Payer: Medicare HMO

## 2021-02-05 DIAGNOSIS — I639 Cerebral infarction, unspecified: Secondary | ICD-10-CM

## 2021-02-09 LAB — CUP PACEART REMOTE DEVICE CHECK
Date Time Interrogation Session: 20220422005418
Implantable Pulse Generator Implant Date: 20191119

## 2021-02-15 ENCOUNTER — Emergency Department (HOSPITAL_BASED_OUTPATIENT_CLINIC_OR_DEPARTMENT_OTHER)
Admission: EM | Admit: 2021-02-15 | Discharge: 2021-02-15 | Disposition: A | Discharge status: Home / Self Care | Payer: Medicare HMO | Attending: Emergency Medicine | Admitting: Emergency Medicine

## 2021-02-15 ENCOUNTER — Other Ambulatory Visit: Payer: Self-pay

## 2021-02-15 ENCOUNTER — Emergency Department (HOSPITAL_BASED_OUTPATIENT_CLINIC_OR_DEPARTMENT_OTHER): Payer: Medicare HMO

## 2021-02-15 ENCOUNTER — Encounter (HOSPITAL_BASED_OUTPATIENT_CLINIC_OR_DEPARTMENT_OTHER): Payer: Self-pay

## 2021-02-15 DIAGNOSIS — Z4502 Encounter for adjustment and management of automatic implantable cardiac defibrillator: Secondary | ICD-10-CM | POA: Insufficient documentation

## 2021-02-15 DIAGNOSIS — N3001 Acute cystitis with hematuria: Secondary | ICD-10-CM | POA: Diagnosis not present

## 2021-02-15 DIAGNOSIS — R4182 Altered mental status, unspecified: Secondary | ICD-10-CM | POA: Diagnosis present

## 2021-02-15 DIAGNOSIS — Z7982 Long term (current) use of aspirin: Secondary | ICD-10-CM | POA: Diagnosis not present

## 2021-02-15 DIAGNOSIS — Z79899 Other long term (current) drug therapy: Secondary | ICD-10-CM | POA: Insufficient documentation

## 2021-02-15 DIAGNOSIS — E039 Hypothyroidism, unspecified: Secondary | ICD-10-CM | POA: Insufficient documentation

## 2021-02-15 DIAGNOSIS — I10 Essential (primary) hypertension: Secondary | ICD-10-CM | POA: Insufficient documentation

## 2021-02-15 LAB — URINALYSIS, ROUTINE W REFLEX MICROSCOPIC
Bilirubin Urine: NEGATIVE
Glucose, UA: NEGATIVE mg/dL
Ketones, ur: NEGATIVE mg/dL
Nitrite: NEGATIVE
Specific Gravity, Urine: 1.014 (ref 1.005–1.030)
pH: 6 (ref 5.0–8.0)

## 2021-02-15 LAB — CBC WITH DIFFERENTIAL/PLATELET
Abs Immature Granulocytes: 0.02 10*3/uL (ref 0.00–0.07)
Basophils Absolute: 0.1 10*3/uL (ref 0.0–0.1)
Basophils Relative: 1 %
Eosinophils Absolute: 0.2 10*3/uL (ref 0.0–0.5)
Eosinophils Relative: 2 %
HCT: 44.1 % (ref 36.0–46.0)
Hemoglobin: 14.1 g/dL (ref 12.0–15.0)
Immature Granulocytes: 0 %
Lymphocytes Relative: 23 %
Lymphs Abs: 1.7 10*3/uL (ref 0.7–4.0)
MCH: 26.5 pg (ref 26.0–34.0)
MCHC: 32 g/dL (ref 30.0–36.0)
MCV: 82.7 fL (ref 80.0–100.0)
Monocytes Absolute: 0.7 10*3/uL (ref 0.1–1.0)
Monocytes Relative: 9 %
Neutro Abs: 4.8 10*3/uL (ref 1.7–7.7)
Neutrophils Relative %: 65 %
Platelets: 224 10*3/uL (ref 150–400)
RBC: 5.33 MIL/uL — ABNORMAL HIGH (ref 3.87–5.11)
RDW: 15.5 % (ref 11.5–15.5)
WBC: 7.4 10*3/uL (ref 4.0–10.5)
nRBC: 0 % (ref 0.0–0.2)

## 2021-02-15 LAB — COMPREHENSIVE METABOLIC PANEL
ALT: 11 U/L (ref 0–44)
AST: 17 U/L (ref 15–41)
Albumin: 4 g/dL (ref 3.5–5.0)
Alkaline Phosphatase: 101 U/L (ref 38–126)
Anion gap: 9 (ref 5–15)
BUN: 14 mg/dL (ref 8–23)
CO2: 23 mmol/L (ref 22–32)
Calcium: 9.6 mg/dL (ref 8.9–10.3)
Chloride: 108 mmol/L (ref 98–111)
Creatinine, Ser: 0.81 mg/dL (ref 0.44–1.00)
GFR, Estimated: >60 mL/min (ref >60)
Glucose, Bld: 98 mg/dL (ref 70–99)
Potassium: 4.2 mmol/L (ref 3.5–5.1)
Sodium: 140 mmol/L (ref 135–145)
Total Bilirubin: 0.5 mg/dL (ref 0.3–1.2)
Total Protein: 6.7 g/dL (ref 6.5–8.1)

## 2021-02-15 LAB — CBG MONITORING, ED: Glucose-Capillary: 86 mg/dL (ref 70–99)

## 2021-02-15 MED ORDER — CEFTRIAXONE SODIUM 1 G IJ SOLR
1.0000 g | Freq: Once | INTRAMUSCULAR | Status: AC
  Administered 2021-02-15: 1 g via INTRAMUSCULAR
  Filled 2021-02-15: qty 10

## 2021-02-15 MED ORDER — SODIUM CHLORIDE 0.9 % IV SOLN
1.0000 g | Freq: Once | INTRAVENOUS | Status: AC
  Administered 2021-02-15: 1 g via INTRAVENOUS
  Filled 2021-02-15: qty 10

## 2021-02-15 MED ORDER — CEPHALEXIN 500 MG PO CAPS: 500.0000 mg | ORAL_CAPSULE | Freq: Three times a day (TID) | ORAL | 0 refills | Status: AC

## 2021-02-15 NOTE — ED Notes (Signed)
Patient 7th on PTAR list at this time.

## 2021-02-15 NOTE — ED Triage Notes (Signed)
Pt arrives from [facility] via [facility], facility reports that they think patient has UTI r/t waking up "screaming out" and "being paranoid" which is not baseline for patient. Denies falls, denies UTI symptoms, no pain, no SOB per EMS.

## 2021-02-15 NOTE — ED Notes (Signed)
Attempted to obtain IV x1 without success. RN at bedside with RT.

## 2021-02-15 NOTE — ED Notes (Signed)
PTAR reports patient is 5th down on the list for PTAR.

## 2021-02-15 NOTE — ED Notes (Signed)
This RN attempted x2 to obtain IV. Blood work obtained but no IV established.

## 2021-02-15 NOTE — ED Provider Notes (Addendum)
[facility] EMERGENCY DEPT Provider Note   CSN: [id number] Arrival date & time: 02/15/21  1151     History Chief Complaint  Patient presents with  . Altered Mental Status    Wendy Andrews is a 87 y.o. female.  The history is provided by the patient and the EMS personnel.  Altered Mental Status Presenting symptoms: confusion   Presenting symptoms comment:  Visual hallucinations and a fall this morning; atypical behavior for her Severity:  Mild Most recent episode:  Today Episode history:  Single Timing:  Constant Progression:  Resolved Chronicity:  New Context: not recent change in medication, not recent illness and not recent infection   Associated symptoms: no abdominal pain, no bladder incontinence, no difficulty breathing, no fever, no palpitations, no rash, no seizures and no vomiting        Past Medical History:  Diagnosis Date  . Hyperlipidemia   . Hypertension   . Insomnia   . Interstitial cystitis   . Stroke [facility])     Patient Active Problem List   Diagnosis Date Noted  . Hypokalemia   . Leukocytosis   . Interstitial cystitis   . Acute ischemic right MCA stroke (HCC) 09/05/2018  . HTN (hypertension) 09/05/2018  . Hypothyroidism 09/05/2018  . HLD (hyperlipidemia) 09/05/2018  . S/P laparoscopic cholecystectomy Feb 2015 12/12/2013    Past Surgical History:  Procedure Laterality Date  . ABDOMINAL HYSTERECTOMY    . CHOLECYSTECTOMY N/A 12/10/2013   Procedure: LAPAROSCOPIC CHOLECYSTECTOMY WITH INTRAOPERATIVE CHOLANGIOGRAM;  Surgeon: Kandis Cocking, MD;  Location: [facility] ORS;  Service: General;  Laterality: N/A;  . LOOP RECORDER INSERTION N/A 09/08/2018   Procedure: LOOP RECORDER INSERTION;  Surgeon: Hillis Range, MD;  Location: [facility] INVASIVE CV LAB;  Service: Cardiovascular;  Laterality: N/A;  . TEE WITHOUT CARDIOVERSION N/A 09/08/2018   Procedure: TRANSESOPHAGEAL ECHOCARDIOGRAM (TEE);  Surgeon: Elease Hashimoto Deloris Ping, MD;  Location: [facility] ENDOSCOPY;   Service: Cardiovascular;  Laterality: N/A;     OB History   No obstetric history on file.     Family History  Problem Relation Age of Onset  . Heart failure Mother   . Heart failure Father   . Cancer Sister   . Cancer Brother        lung  . Cancer Brother        brain  . Cancer Brother     Social History   Tobacco Use  . Smoking status: Never Smoker  . Smokeless tobacco: Never Used  Vaping Use  . Vaping Use: Never used  Substance Use Topics  . Alcohol use: No  . Drug use: No    Home Medications Prior to Admission medications   Medication Sig Start Date End Date Taking? Authorizing Provider  acetaminophen (TYLENOL) 325 MG tablet Take 2 tablets (650 mg total) by mouth every 4 (four) hours as needed for mild pain (or temp > 37.5 C (99.5 F)). 09/14/18   Rhetta Mura, MD  aspirin EC 81 MG tablet Take 81 mg by mouth daily.    [provider]  atorvastatin (LIPITOR) 40 MG tablet Take 1 tablet (40 mg total) by mouth daily at 6 PM. 09/14/18   Rhetta Mura, MD  levothyroxine (SYNTHROID, LEVOTHROID) 75 MCG tablet Take 75 mcg by mouth daily.    [provider]  lisinopril (PRINIVIL,ZESTRIL) 20 MG tablet Take 20 mg by mouth daily.     [provider]  LORazepam (ATIVAN) 1 MG tablet Take 1 tablet (1 mg total) by mouth daily  as needed for anxiety. Patient not taking: Reported on 08/19/2019 09/14/18   Rhetta Mura, MD  meloxicam (MOBIC) 7.5 MG tablet Take 7.5 mg by mouth daily.    [provider]  traZODone (DESYREL) 150 MG tablet Take 1 tablet (150 mg total) by mouth at bedtime. 09/14/18   Rhetta Mura, MD    Allergies    Tetanus toxoids and Penicillins  Review of Systems   Review of Systems  Constitutional: Negative for chills and fever.  HENT: Negative for ear pain and sore throat.   Eyes: Negative for pain and visual disturbance.  Respiratory: Negative for cough and shortness of breath.   Cardiovascular:  Negative for chest pain and palpitations.  Gastrointestinal: Negative for abdominal pain and vomiting.  Genitourinary: Negative for bladder incontinence, dysuria and hematuria.  Musculoskeletal: Negative for arthralgias and back pain.  Skin: Negative for color change and rash.  Neurological: Negative for seizures and syncope.  Psychiatric/Behavioral: Positive for confusion.  All other systems reviewed and are negative.   Physical Exam Updated Vital Signs BP (!) 177/75   Pulse (!) 43   Temp 98.3 F (36.8 C) (Oral)   Resp 11   Ht 5\' 4"  (1.626 m)   Wt 85.4 kg   LMP 10/22/1957   SpO2 98%   BMI 32.32 kg/m   Physical Exam Vitals and nursing note reviewed.  Constitutional:      General: She is not in acute distress.    Appearance: She is well-developed.  HENT:     Head: Normocephalic and atraumatic.  Eyes:     Conjunctiva/sclera: Conjunctivae normal.  Cardiovascular:     Rate and Rhythm: Normal rate and regular rhythm.     Heart sounds: No murmur heard.   Pulmonary:     Effort: Pulmonary effort is normal. No respiratory distress.     Breath sounds: Normal breath sounds.  Abdominal:     Palpations: Abdomen is soft.     Tenderness: There is no abdominal tenderness.  Musculoskeletal:     Cervical back: Neck supple.  Skin:    General: Skin is warm and dry.  Neurological:     General: No focal deficit present.     Mental Status: She is alert. She is disoriented.     Cranial Nerves: No cranial nerve deficit.     Sensory: No sensory deficit.     Motor: No weakness.     Coordination: Coordination normal.     Comments: Oriented to person but states that it is 1985 and that she is at 12/20/1957  Psychiatric:        Mood and Affect: Mood normal.     ED Results / Procedures / Treatments   Labs (all labs ordered are listed, but only abnormal results are displayed) Labs Reviewed  URINALYSIS, ROUTINE W REFLEX MICROSCOPIC - Abnormal; Notable for the following components:       Result Value   APPearance HAZY (*)    Hgb urine dipstick SMALL (*)    Protein, ur TRACE (*)    Leukocytes,Ua TRACE (*)    Bacteria, UA MANY (*)    All other components within normal limits  CBC WITH DIFFERENTIAL/PLATELET - Abnormal; Notable for the following components:   RBC 5.33 (*)    All other components within normal limits  URINE CULTURE  COMPREHENSIVE METABOLIC PANEL  CBC WITH DIFFERENTIAL/PLATELET  CBG MONITORING, ED    EKG EKG Interpretation  Date/Time:  Thursday February 15 2021 12:11:23 EDT Ventricular Rate:  69  PR Interval:  200 QRS Duration: 94 QT Interval:  438 QTC Calculation: 470 R Axis:   25 Text Interpretation: Sinus rhythm Probable anteroseptal infarct, old normal axis QTc normal Confirmed by Pieter Partridge ([id number]) on 02/15/2021 12:59:04 PM   Radiology CT Head Wo Contrast  Result Date: 02/15/2021 CLINICAL DATA:  Mental status change, unknown cause. EXAM: CT HEAD WITHOUT CONTRAST TECHNIQUE: Contiguous axial images were obtained from the base of the skull through the vertex without intravenous contrast. COMPARISON:  CT September 05, 2018, MRI September 06, 2018. FINDINGS: Brain: Interval evolution of the posterior right MCA infarcts seen on MRI from September 06, 2018. Similar appearance of additional remote infarcts involving bilateral basal ganglia. There is advanced patchy white matter hypoattenuation, most likely related to chronic microvascular ischemic disease. No acute hemorrhage. Mild for age atrophy with ex vacuo ventricular dilation. No hydrocephalus. No mass lesion or abnormal mass effect. Vascular: Extensive calcific atherosclerosis. No hyperdense vessel identified. Skull: No acute fracture. Sinuses/Orbits: Visualized sinuses are clear. Unremarkable visualized orbits. Other: No mastoid effusions. IMPRESSION: 1. No evidence of acute intracranial abnormality. 2. Interval evolution of the posterior right MCA infarcts seen on MRI from 2019. Similar remote bilateral  basal ganglia lacunar infarcts. 3. Advanced chronic microvascular ischemic disease. A small acute white matter infarct could be easily obscured. MRI could provide more sensitive evaluation for acute infarct if clinically indicated. Electronically Signed   By: Feliberto Harts MD   On: 02/15/2021 13:30    Procedures Procedures   Medications Ordered in ED Medications  cefTRIAXone (ROCEPHIN) 1 g in sodium chloride 0.9 % 100 mL IVPB (1 g Intravenous New Bag/Given 02/15/21 1517)    ED Course  I have reviewed the triage vital signs and the nursing notes.  Pertinent labs & imaging results that were available during my care of the patient were reviewed by me and considered in my medical decision making (see chart for details).    MDM Rules/Calculators/A&P                          Wendy Andrews presented from assisted living with altered mental status that have been transient.  She also experienced a fall without obvious trauma.  She was evaluated for evidence of acute infection, neurologic emergency, trauma, metabolic emergency.  ED evaluation revealed a likely urinary tract infection that was treated with Rocephin.  She will be given Keflex as an outpatient, and urine culture is pending.  Otherwise her work-up was reassuring, and she will be discharged back to her assisted living facility. Final Clinical Impression(s) / ED Diagnoses Final diagnoses:  Acute cystitis with hematuria    Rx / DC Orders ED Discharge Orders         Ordered    cephALEXin (KEFLEX) 500 MG capsule  3 times daily        02/15/21 1548           Koleen Distance, MD 02/15/21 1550    Koleen Distance, MD 02/15/21 1550

## 2021-02-18 LAB — URINE CULTURE: Culture: >=100000 — AB

## 2021-02-19 ENCOUNTER — Telehealth: Payer: Self-pay | Admitting: Emergency Medicine

## 2021-02-19 NOTE — Telephone Encounter (Signed)
Post ED Visit - Positive Culture Follow-up  Culture report reviewed by antimicrobial stewardship pharmacist: [facility] []  , Pharm.D. []  Enzo Bi, Pharm.D., BCPS AQ-ID []  , Pharm.D., BCPS []  Celedonio Miyamoto, Pharm.D., BCPS []  Woodson, Garvin Fila.D., BCPS, AAHIVP []  , Pharm.D., BCPS, AAHIVP []  Georgina Pillion, PharmD, BCPS []  , PharmD, BCPS []  [place], PharmD, BCPS []  [address], PharmD []  , PharmD, BCPS []  Estella Husk, PharmD  Pharmacy Team []  Lysle Pearl, PharmD []  , PharmD []  Phillips Climes, PharmD []  , Rph []  Agapito Games) , PharmD []  Verlan Friends, PharmD []  , PharmD []  Mervyn Gay, PharmD []  , PharmD []  Vinnie Level, PharmD []  [facility], PharmD []  , PharmD []  Len Childs, PharmD   Positive urine culture Treated with cephalexin, organism sensitive to the same and no further patient follow-up is required at this time.  02/19/2021, 12:38 PM

## 2021-02-22 NOTE — Progress Notes (Signed)
Carelink Summary Report / Loop Recorder 

## 2021-03-08 ENCOUNTER — Ambulatory Visit (INDEPENDENT_AMBULATORY_CARE_PROVIDER_SITE_OTHER): Payer: Medicare HMO

## 2021-03-08 DIAGNOSIS — I639 Cerebral infarction, unspecified: Secondary | ICD-10-CM | POA: Diagnosis not present

## 2021-03-15 LAB — CUP PACEART REMOTE DEVICE CHECK
Date Time Interrogation Session: 20220525010023
Implantable Pulse Generator Implant Date: 20191119

## 2021-03-30 NOTE — Progress Notes (Signed)
Carelink Summary Report / Loop Recorder 

## 2021-04-16 ENCOUNTER — Ambulatory Visit (INDEPENDENT_AMBULATORY_CARE_PROVIDER_SITE_OTHER): Payer: Medicare HMO

## 2021-04-16 DIAGNOSIS — I63511 Cerebral infarction due to unspecified occlusion or stenosis of right middle cerebral artery: Secondary | ICD-10-CM

## 2021-04-16 LAB — CUP PACEART REMOTE DEVICE CHECK
Date Time Interrogation Session: 20220627011236
Implantable Pulse Generator Implant Date: 20191119

## 2021-05-04 NOTE — Progress Notes (Signed)
Carelink Summary Report / Loop Recorder 

## 2023-09-14 ENCOUNTER — Emergency Department (HOSPITAL_COMMUNITY)
Admission: EM | Admit: 2023-09-14 | Discharge: 2023-09-14 | Disposition: A | Discharge status: Home / Self Care | Payer: Medicare HMO | Attending: Emergency Medicine | Admitting: Emergency Medicine

## 2023-09-14 ENCOUNTER — Encounter (HOSPITAL_COMMUNITY): Payer: Self-pay

## 2023-09-14 ENCOUNTER — Emergency Department (HOSPITAL_COMMUNITY): Payer: Medicare HMO

## 2023-09-14 ENCOUNTER — Other Ambulatory Visit: Payer: Self-pay

## 2023-09-14 DIAGNOSIS — E039 Hypothyroidism, unspecified: Secondary | ICD-10-CM | POA: Insufficient documentation

## 2023-09-14 DIAGNOSIS — Z7982 Long term (current) use of aspirin: Secondary | ICD-10-CM | POA: Diagnosis not present

## 2023-09-14 DIAGNOSIS — I10 Essential (primary) hypertension: Secondary | ICD-10-CM | POA: Diagnosis not present

## 2023-09-14 DIAGNOSIS — Z79899 Other long term (current) drug therapy: Secondary | ICD-10-CM | POA: Insufficient documentation

## 2023-09-14 DIAGNOSIS — J181 Lobar pneumonia, unspecified organism: Secondary | ICD-10-CM | POA: Diagnosis not present

## 2023-09-14 DIAGNOSIS — Z1152 Encounter for screening for COVID-19: Secondary | ICD-10-CM | POA: Diagnosis not present

## 2023-09-14 DIAGNOSIS — J189 Pneumonia, unspecified organism: Secondary | ICD-10-CM

## 2023-09-14 DIAGNOSIS — D649 Anemia, unspecified: Secondary | ICD-10-CM | POA: Diagnosis not present

## 2023-09-14 DIAGNOSIS — R0602 Shortness of breath: Secondary | ICD-10-CM | POA: Diagnosis present

## 2023-09-14 DIAGNOSIS — R079 Chest pain, unspecified: Secondary | ICD-10-CM | POA: Insufficient documentation

## 2023-09-14 LAB — BASIC METABOLIC PANEL
Anion gap: 9 (ref 5–15)
BUN: 23 mg/dL (ref 8–23)
CO2: 18 mmol/L — ABNORMAL LOW (ref 22–32)
Calcium: 9.2 mg/dL (ref 8.9–10.3)
Chloride: 111 mmol/L (ref 98–111)
Creatinine, Ser: 0.89 mg/dL (ref 0.44–1.00)
GFR, Estimated: >60 mL/min (ref >60)
Glucose, Bld: 81 mg/dL (ref 70–99)
Potassium: 4.5 mmol/L (ref 3.5–5.1)
Sodium: 138 mmol/L (ref 135–145)

## 2023-09-14 LAB — TROPONIN I (HIGH SENSITIVITY)
Troponin I (High Sensitivity): 4 ng/L (ref <18)
Troponin I (High Sensitivity): 3 ng/L (ref <18)

## 2023-09-14 LAB — CBC WITH DIFFERENTIAL/PLATELET
Abs Immature Granulocytes: 0.02 10*3/uL (ref 0.00–0.07)
Basophils Absolute: 0 10*3/uL (ref 0.0–0.1)
Basophils Relative: 1 %
Eosinophils Absolute: 0.2 10*3/uL (ref 0.0–0.5)
Eosinophils Relative: 3 %
HCT: 33.7 % — ABNORMAL LOW (ref 36.0–46.0)
Hemoglobin: 10.6 g/dL — ABNORMAL LOW (ref 12.0–15.0)
Immature Granulocytes: 0 %
Lymphocytes Relative: 28 %
Lymphs Abs: 1.9 10*3/uL (ref 0.7–4.0)
MCH: 26.6 pg (ref 26.0–34.0)
MCHC: 31.5 g/dL (ref 30.0–36.0)
MCV: 84.5 fL (ref 80.0–100.0)
Monocytes Absolute: 0.7 10*3/uL (ref 0.1–1.0)
Monocytes Relative: 10 %
Neutro Abs: 4 10*3/uL (ref 1.7–7.7)
Neutrophils Relative %: 58 %
Platelets: 237 10*3/uL (ref 150–400)
RBC: 3.99 MIL/uL (ref 3.87–5.11)
RDW: 15.6 % — ABNORMAL HIGH (ref 11.5–15.5)
WBC: 6.8 10*3/uL (ref 4.0–10.5)
nRBC: 0 % (ref 0.0–0.2)

## 2023-09-14 LAB — RESP PANEL BY RT-PCR (RSV, FLU A&B, COVID)  RVPGX2
Influenza A by PCR: NEGATIVE
Influenza B by PCR: NEGATIVE
Resp Syncytial Virus by PCR: NEGATIVE
SARS Coronavirus 2 by RT PCR: NEGATIVE

## 2023-09-14 MED ORDER — DOXYCYCLINE HYCLATE 100 MG PO CAPS: 100.0000 mg | ORAL_CAPSULE | Freq: Two times a day (BID) | ORAL | 0 refills | Status: AC

## 2023-09-14 MED ORDER — DOXYCYCLINE HYCLATE 100 MG PO TABS
100.0000 mg | ORAL_TABLET | Freq: Once | ORAL | Status: AC
  Administered 2023-09-14: 100 mg via ORAL
  Filled 2023-09-14: qty 1

## 2023-09-14 MED ORDER — HYDROCODONE-ACETAMINOPHEN 5-325 MG PO TABS: 1.0000 | ORAL_TABLET | Freq: Once | ORAL | Status: DC

## 2023-09-14 NOTE — ED Provider Notes (Signed)
[place] EMERGENCY DEPARTMENT AT [facility] Provider Note   CSN: [id number] Arrival date & time: 09/14/23  1233     History  Chief Complaint  Patient presents with   Shortness of Breath   Chest Pain    Wendy Andrews is a 89 y.o. female.  Patient is a 89 year old female with a past medical history of CVA, hypertension, hypothyroidism presenting to the emergency department with chest pain and shortness of breath.  Patient tells me that she woke up this morning with chest pain that "felt like she could not breathe.  She does endorse shortness of breath.  She states that her symptoms have now all resolved.  She did report to EMS that her chest pain started at 11 AM and told him that she had 10 out of 10 chest pain and told her RN she was only feeling short of breath on arrival here.  She denies any associated fever or cough, nausea or vomiting or lower extremity swelling.  The history is provided by the patient.  Shortness of Breath Associated symptoms: chest pain   Chest Pain Associated symptoms: shortness of breath        Home Medications Prior to Admission medications   Medication Sig Start Date End Date Taking? Authorizing Provider  acetaminophen (TYLENOL) 325 MG tablet Take 2 tablets (650 mg total) by mouth every 4 (four) hours as needed for mild pain (or temp > 37.5 C (99.5 F)). 09/14/18   Rhetta Mura, MD  aspirin EC 81 MG tablet Take 81 mg by mouth daily.    [provider]  atorvastatin (LIPITOR) 40 MG tablet Take 1 tablet (40 mg total) by mouth daily at 6 PM. 09/14/18   Rhetta Mura, MD  levothyroxine (SYNTHROID, LEVOTHROID) 75 MCG tablet Take 75 mcg by mouth daily.    [provider]  lisinopril (PRINIVIL,ZESTRIL) 20 MG tablet Take 20 mg by mouth daily.     [provider]  LORazepam (ATIVAN) 1 MG tablet Take 1 tablet (1 mg total) by mouth daily as needed for anxiety. Patient not taking: Reported on 08/19/2019  09/14/18   Rhetta Mura, MD  meloxicam (MOBIC) 7.5 MG tablet Take 7.5 mg by mouth daily.    [provider]  traZODone (DESYREL) 150 MG tablet Take 1 tablet (150 mg total) by mouth at bedtime. 09/14/18   Rhetta Mura, MD      Allergies    Tetanus toxoids and Penicillins    Review of Systems   Review of Systems  Respiratory:  Positive for shortness of breath.   Cardiovascular:  Positive for chest pain.    Physical Exam Updated Vital Signs BP 137/71   Pulse 65   Resp (!) 22   Ht 5\' 4"  (1.626 m)   Wt 90.7 kg   LMP 10/22/1957   SpO2 98%   BMI 34.33 kg/m  Physical Exam Vitals and nursing note reviewed.  Constitutional:      General: She is not in acute distress.    Appearance: She is well-developed.  HENT:     Head: Normocephalic and atraumatic.     Mouth/Throat:     Mouth: Mucous membranes are moist.  Eyes:     Extraocular Movements: Extraocular movements intact.  Cardiovascular:     Rate and Rhythm: Normal rate and regular rhythm.  Pulmonary:     Effort: Pulmonary effort is normal.     Breath sounds: Normal breath sounds.  Chest:     Chest wall: No  tenderness.  Abdominal:     Palpations: Abdomen is soft.  Musculoskeletal:     Cervical back: Normal range of motion and neck supple.     Right lower leg: No edema.     Left lower leg: No edema.  Skin:    General: Skin is warm and dry.  Neurological:     General: No focal deficit present.     Mental Status: She is alert and oriented to person, place, and time.  Psychiatric:        Mood and Affect: Mood normal.        Behavior: Behavior normal.     ED Results / Procedures / Treatments   Labs (all labs ordered are listed, but only abnormal results are displayed) Labs Reviewed  CBC WITH DIFFERENTIAL/PLATELET - Abnormal; Notable for the following components:      Result Value   Hemoglobin 10.6 (*)    HCT 33.7 (*)    RDW 15.6 (*)    All other components within normal limits  BASIC  METABOLIC PANEL - Abnormal; Notable for the following components:   CO2 18 (*)    All other components within normal limits  RESP PANEL BY RT-PCR (RSV, FLU A&B, COVID)  RVPGX2  TROPONIN I (HIGH SENSITIVITY)    EKG EKG Interpretation Date/Time:  Sunday September 14 2023 12:42:23 EST Ventricular Rate:  72 PR Interval:  184 QRS Duration:  85 QT Interval:  400 QTC Calculation: 438 R Axis:   7  Text Interpretation: Sinus rhythm No significant change since last tracing Confirmed by Elayne Snare ([id number]) on 09/14/2023 12:54:20 PM  Radiology DG Chest Port 1 View  Result Date: 09/14/2023 CLINICAL DATA:  chest pain EXAM: PORTABLE CHEST 1 VIEW COMPARISON:  09/06/2018. FINDINGS: There are coarse bronchovascular markings throughout bilateral lungs. There are nonspecific opacities involving the left lower lung zone which may represent pneumonitis and/or atelectasis. Correlate clinically. Bilateral lungs otherwise appear clear. No dense consolidation or lung collapse. Bilateral costophrenic angles are clear. Stable cardio-mediastinal silhouette. Loop recorder device noted overlying the left lower chest wall. No acute osseous abnormalities. The soft tissues are within normal limits. IMPRESSION: *Nonspecific opacities involving the left lower lung zone may represent pneumonitis and/or atelectasis. Correlate clinically. *Coarse bronchovascular markings may represent underlying lung fibrosis/scarring. Electronically Signed   By: Jules Schick M.D.   On: 09/14/2023 14:20    Procedures Procedures    Medications Ordered in ED Medications - No data to display  ED Course/ Medical Decision Making/ A&P Clinical Course as of 09/14/23 1559  Sun Sep 14, 2023  1555 Patient signed out to Dr. Criss Alvine pending repeat troponin.  [VK]    Clinical Course User Index [VK] Rexford Maus, DO                                 Medical Decision Making This patient presents to the ED with chief complaint(s)  of chest pain, shortness of breath with pertinent past medical history of CVA, hypertension, hyperlipidemia which further complicates the presenting complaint. The complaint involves an extensive differential diagnosis and also carries with it a high risk of complications and morbidity.    The differential diagnosis includes ACS, arrhythmia, anemia, pneumonia, pneumothorax, pulmonary edema, pleural effusion  Additional history obtained: Additional history obtained from EMS  Records reviewed Nursing Home Documents  ED Course and Reassessment: On patient's arrival she is hemodynamically stable in no acute distress. EKG on  arrival showed normal sinus rhythm without acute ischemic changes.  She reports that she is symptom-free at this time.  Patient will of labs including troponin and chest x-ray performed.  She will require 2 troponins with her symptoms starting just prior to arrival will be closely reassessed.  Independent labs interpretation:  The following labs were independently interpreted: Initial troponin negative, mild anemia  Independent visualization of imaging: - I independently visualized the following imaging with scope of interpretation limited to determining acute life threatening conditions related to emergency care: CXR, which revealed atelectasis vs infiltrate     Amount and/or Complexity of Data Reviewed Labs: ordered. Radiology: ordered.          Final Clinical Impression(s) / ED Diagnoses Final diagnoses:  None    Rx / DC Orders ED Discharge Orders     None         Rexford Maus, DO 09/14/23 1559

## 2023-09-14 NOTE — ED Notes (Signed)
Ambulated pt with assistance, usually uses walker, but could not find one.  Pt's sats 98-100% walking about 20 or 30 ft total. Currently 96% on room air. Family stated this is about normal for her at home.

## 2023-09-14 NOTE — ED Provider Notes (Addendum)
Care transferred to me.  Patient is noting some shortness of breath.  She denies chest pain to me but it seems like she had some chest pain at the facility.  She tells me she has had a little bit of a cough.  No fevers.  First troponin is negative.  WBC normal.  Questionable pneumonia versus atelectasis on x-ray. However, given no other clear cause we treat as possible pneumonia with doxycycline.  Otherwise, is able to ambulate here without hypoxia or issue.  No current chest pain and negative troponins x 2.  I offered admission but patient prefers discharge. I had a discussion back-and-forth with patient and family and they all are in agreement on her going back to her facility where she can be monitored and get close recheck and come back if any symptoms recur.  She is noted to be a little anemic at 10.  No recent values in the last few years.  Denies any bloody or black stools or bleeding.  She will get this rechecked at her PCP.  Will discharge to facility with return precautions.    Pricilla Loveless, MD 09/14/23 [phone]

## 2023-09-14 NOTE — Discharge Instructions (Signed)
We are treating you for possible pneumonia with antibiotics.  You were given the first dose tonight and can start the next dose tomorrow morning.  Follow-up closely with your primary care physician.  Your hemoglobin today is 10, which is mildly low, and this will need to be rechecked by your primary care physician, ideally in the next few days to week.  If you develop new or worsening shortness of breath, chest pain, coughing up blood, or any other new/concerning symptoms then return to the ER or call 911.

## 2023-09-14 NOTE — ED Triage Notes (Signed)
Per EMS and pt report, pt started having chest pain and shortness of breath this morning around 11am. Pt reported 10/10 chest pain at the facility for EMS. During triage, pt denies any chest pain and states she is only having shortness of breath. A&O x3 with confusion of time. Has a hx of HTN and hyperlipidemia.

## 2023-11-05 ENCOUNTER — Other Ambulatory Visit: Payer: Self-pay

## 2023-11-05 ENCOUNTER — Emergency Department (HOSPITAL_COMMUNITY)
Admission: EM | Admit: 2023-11-05 | Discharge: 2023-11-05 | Disposition: A | Discharge status: Home / Self Care | Payer: Medicare HMO | Attending: Emergency Medicine | Admitting: Emergency Medicine

## 2023-11-05 ENCOUNTER — Encounter (HOSPITAL_COMMUNITY): Payer: Self-pay | Admitting: *Deleted

## 2023-11-05 ENCOUNTER — Emergency Department (HOSPITAL_COMMUNITY): Payer: Medicare HMO

## 2023-11-05 DIAGNOSIS — R079 Chest pain, unspecified: Secondary | ICD-10-CM | POA: Diagnosis present

## 2023-11-05 DIAGNOSIS — I10 Essential (primary) hypertension: Secondary | ICD-10-CM | POA: Diagnosis not present

## 2023-11-05 DIAGNOSIS — Z8673 Personal history of transient ischemic attack (TIA), and cerebral infarction without residual deficits: Secondary | ICD-10-CM | POA: Insufficient documentation

## 2023-11-05 DIAGNOSIS — R0602 Shortness of breath: Secondary | ICD-10-CM | POA: Insufficient documentation

## 2023-11-05 DIAGNOSIS — Z79899 Other long term (current) drug therapy: Secondary | ICD-10-CM | POA: Diagnosis not present

## 2023-11-05 DIAGNOSIS — Z7982 Long term (current) use of aspirin: Secondary | ICD-10-CM | POA: Diagnosis not present

## 2023-11-05 LAB — BASIC METABOLIC PANEL
Anion gap: 11 (ref 5–15)
BUN: 11 mg/dL (ref 8–23)
CO2: 18 mmol/L — ABNORMAL LOW (ref 22–32)
Calcium: 8.5 mg/dL — ABNORMAL LOW (ref 8.9–10.3)
Chloride: 110 mmol/L (ref 98–111)
Creatinine, Ser: 0.93 mg/dL (ref 0.44–1.00)
GFR, Estimated: 58 mL/min — ABNORMAL LOW (ref >60)
Glucose, Bld: 112 mg/dL — ABNORMAL HIGH (ref 70–99)
Potassium: 3.7 mmol/L (ref 3.5–5.1)
Sodium: 139 mmol/L (ref 135–145)

## 2023-11-05 LAB — CBC WITH DIFFERENTIAL/PLATELET
Abs Immature Granulocytes: 0.02 10*3/uL (ref 0.00–0.07)
Basophils Absolute: 0.1 10*3/uL (ref 0.0–0.1)
Basophils Relative: 1 %
Eosinophils Absolute: 0.2 10*3/uL (ref 0.0–0.5)
Eosinophils Relative: 3 %
HCT: 39.1 % (ref 36.0–46.0)
Hemoglobin: 12.2 g/dL (ref 12.0–15.0)
Immature Granulocytes: 0 %
Lymphocytes Relative: 30 %
Lymphs Abs: 2.1 10*3/uL (ref 0.7–4.0)
MCH: 26.1 pg (ref 26.0–34.0)
MCHC: 31.2 g/dL (ref 30.0–36.0)
MCV: 83.5 fL (ref 80.0–100.0)
Monocytes Absolute: 0.4 10*3/uL (ref 0.1–1.0)
Monocytes Relative: 7 %
Neutro Abs: 4 10*3/uL (ref 1.7–7.7)
Neutrophils Relative %: 59 %
Platelets: 258 10*3/uL (ref 150–400)
RBC: 4.68 MIL/uL (ref 3.87–5.11)
RDW: 15.5 % (ref 11.5–15.5)
WBC: 6.8 10*3/uL (ref 4.0–10.5)
nRBC: 0 % (ref 0.0–0.2)

## 2023-11-05 LAB — BRAIN NATRIURETIC PEPTIDE: B Natriuretic Peptide: 17.6 pg/mL (ref 0.0–100.0)

## 2023-11-05 LAB — TROPONIN I (HIGH SENSITIVITY)
Troponin I (High Sensitivity): 5 ng/L (ref <18)
Troponin I (High Sensitivity): 5 ng/L (ref <18)

## 2023-11-05 NOTE — ED Notes (Signed)
 Patient Alert and oriented to baseline. Stable and ambulatory to baseline. Patient verbalized understanding of the discharge instructions.  Patient belongings were taken by the patient.

## 2023-11-05 NOTE — Discharge Instructions (Signed)
 You were seen today for chest pain.  Your heart enzymes were reassuring.  I spoke with cardiology, they will call you to schedule follow-up.  You should follow-up with your regular doctor as well.  If you develop repeat chest pain, difficulty breathing, fainting, fever or any other new concerning symptoms you should return to the ED.

## 2023-11-05 NOTE — ED Triage Notes (Signed)
 Pt arrives via [facility] from [facility] from [facility]. PTA, she woke up by 10/10 tightness and pressure in the center of her chest, non radiating. 324 ASA given PTA. En route IV established, 12 lead unremarkable. She was given 2 nitroglycerin, with improvement of pain to 5/10. HR 104-110, ST, 176/94. CBG 140, 98% RA.

## 2023-11-05 NOTE — ED Triage Notes (Signed)
 Pt says that she woke up with a "tightness, like a knot" in her chest. Reports associated with SOB. Pain now subsided. Denies recent cough or fevers.

## 2023-11-05 NOTE — ED Provider Triage Note (Signed)
 Emergency Medicine Provider Triage Evaluation Note  Wendy Andrews , a 89 y.o. female  was evaluated in triage.  Pt complains of chest pressure and shortness of breath. Patient with reported 10/10 tightness upon awakening. Described as pressure without radiation. Patient administered 324mg  ASA and 2 NTG by EMS. Patient now denying pain but continuing to endorse shortness of breath.   Review of Systems  Positive:  Negative:   Physical Exam  LMP 10/22/1957  Gen:   Awake, no distress   Resp:  Normal effort  MSK:   Moves extremities without difficulty  Other:    Medical Decision Making  Medically screening exam initiated at 5:42 AM.  Appropriate orders placed.  Wendy Andrews was informed that the remainder of the evaluation will be completed by another provider, this initial triage assessment does not replace that evaluation, and the importance of remaining in the ED until their evaluation is complete.     Wendy Andrews, [place] 11/05/23 [phone]

## 2023-11-05 NOTE — ED Provider Notes (Signed)
 [address] [facility]ER[facility] DEPARTMENT AT [facility] Provider Note   CSN: [id number] Arrival date & time: 11/05/23  6578     History  Chief Complaint  Patient presents with   Chest Pain    BRYTTANI PFLUGH is a 89 y.o. female.   Chest Pain 89 year old female history of hypertension, hyperlipidemia, prior stroke presenting for chest pain.  Patient lives at assisted living facility alone.  She states last night while sitting in bed she developed sudden onset chest tightness and shortness of breath.  No pleuritic pain.  Had difficulty breathing due to chest tightness at the time.  Lasted for about an hour, she states she thought she was going to die.  No nausea or vomiting or diaphoresis.  Pain resolved.  She had a similar episode about a month ago and has not had any issues since.  Nephew is at bedside.  She was given aspirin , nitroglycerin and route.  Currently is asymptomatic, no chest pain or shortness of breath or any other symptoms.     Home Medications Prior to Admission medications   Medication Sig Start Date End Date Taking? Authorizing Provider  acetaminophen  (TYLENOL ) 325 MG tablet Take 2 tablets (650 mg total) by mouth every 4 (four) hours as needed for mild pain (or temp > 37.5 C (99.5 F)). 09/14/18   Samtani, Jai-Gurmukh, MD  aspirin  EC 81 MG tablet Take 81 mg by mouth daily.    [provider]  ASPIRIN  LOW DOSE 81 MG chewable tablet Chew 81 mg by mouth daily. 09/25/23   [provider]  atorvastatin  (LIPITOR) 20 MG tablet Take 20 mg by mouth daily. 09/05/23   [provider]  atorvastatin  (LIPITOR) 40 MG tablet Take 1 tablet (40 mg total) by mouth daily at 6 PM. 09/14/18   Samtani, Jai-Gurmukh, MD  doxycycline  (VIBRAMYCIN ) 100 MG capsule Take 1 capsule (100 mg total) by mouth 2 (two) times daily. One po bid x 7 days 09/14/23   Jerilynn Montenegro, MD  levothyroxine  (SYNTHROID , LEVOTHROID) 75 MCG tablet Take 75 mcg by mouth daily.    [provider]  lisinopril  (PRINIVIL ,ZESTRIL ) 20 MG tablet Take 20 mg by mouth daily.     [provider]  LORazepam  (ATIVAN ) 1 MG tablet Take 1 tablet (1 mg total) by mouth daily as needed for anxiety. Patient not taking: Reported on 08/19/2019 09/14/18   Samtani, Jai-Gurmukh, MD  meloxicam (MOBIC) 7.5 MG tablet Take 7.5 mg by mouth daily.    [provider]  traZODone  (DESYREL ) 150 MG tablet Take 1 tablet (150 mg total) by mouth at bedtime. 09/14/18   Samtani, Jai-Gurmukh, MD  traZODone  (DESYREL ) 50 MG tablet Take 50 mg by mouth at bedtime. 09/12/23   [provider]      Allergies    Tetanus toxoids and Penicillins    Review of Systems   Review of Systems  Cardiovascular:  Positive for chest pain.  Review of systems completed and notable as per HPI.  ROS otherwise negative.   Physical Exam Updated Vital Signs BP 127/82   Pulse 87   Temp 98.7 F (37.1 C) (Oral)   Resp (!) 22   LMP 10/22/1959   SpO2 96%  Physical Exam Vitals and nursing note reviewed.  Constitutional:      General: She is not in acute distress.    Appearance: She is well-developed.  HENT:     Head: Normocephalic and atraumatic.  Eyes:     Extraocular Movements: Extraocular movements  intact.     Conjunctiva/sclera: Conjunctivae normal.     Pupils: Pupils are equal, round, and reactive to light.  Cardiovascular:     Rate and Rhythm: Normal rate and regular rhythm.     Pulses: Normal pulses.          Radial pulses are 2+ on the right side and 2+ on the left side.       Dorsalis pedis pulses are 2+ on the right side and 2+ on the left side.     Heart sounds: Normal heart sounds. No murmur heard. Pulmonary:     Effort: Pulmonary effort is normal. No respiratory distress.     Breath sounds: Normal breath sounds.  Abdominal:     Palpations: Abdomen is soft.     Tenderness: There is no abdominal tenderness. There is no guarding or rebound.  Musculoskeletal:        General: No  swelling.     Cervical back: Neck supple.     Right lower leg: No edema.     Left lower leg: No edema.  Skin:    General: Skin is warm and dry.     Capillary Refill: Capillary refill takes less than 2 seconds.  Neurological:     General: No focal deficit present.     Mental Status: She is alert and oriented to person, place, and time. Mental status is at baseline.  Psychiatric:        Mood and Affect: Mood normal.     ED Results / Procedures / Treatments   Labs (all labs ordered are listed, but only abnormal results are displayed) Labs Reviewed  BASIC METABOLIC PANEL - Abnormal; Notable for the following components:      Result Value   CO2 18 (*)    Glucose, Bld 112 (*)    Calcium  8.5 (*)    GFR, Estimated 58 (*)    All other components within normal limits  BRAIN NATRIURETIC PEPTIDE  CBC WITH DIFFERENTIAL/PLATELET  TROPONIN I (HIGH SENSITIVITY)  TROPONIN I (HIGH SENSITIVITY)    EKG EKG Interpretation Date/Time:  Wednesday November 05 2023 05:38:56 EST Ventricular Rate:  95 PR Interval:  190 QRS Duration:  74 QT Interval:  364 QTC Calculation: 457 R Axis:   1  Text Interpretation: Normal sinus rhythm Anterior infarct , age undetermined Abnormal ECG When compared with ECG of 14-Sep-2023 12:42, PREVIOUS ECG IS PRESENT Confirmed by Rueben Cote [phone]) on 11/05/2023 9:09:11 AM  Radiology DG Chest 2 View Result Date: 11/05/2023 CLINICAL DATA:  Chest pain and shortness of breath EXAM: CHEST - 2 VIEW COMPARISON:  09/14/2023 FINDINGS: Interstitial coarsening on both sides, greater on the left. Low lung volumes similar to prior. No effusion or pneumothorax seen. Generous heart size. There is an implantable loop recorder over the right paramedian chest. Extensive atheromatous calcification. Diffuse degenerative endplate spurring. IMPRESSION: Low volume chest with reticulation at the left base favoring scar given stability from 09/14/2023. No new abnormality. Electronically  Signed   By: Ronnette Coke M.D.   On: 11/05/2023 06:13    Procedures Procedures    Medications Ordered in ED Medications - No data to display  ED Course/ Medical Decision Making/ A&P Clinical Course as of 11/05/23 1115  Wed Nov 05, 2023  1027 Cards Thukkani: recommends OP follow up [JD]    Clinical Course User Index [JD] Coleman Daughters, MD  Medical Decision Making  Medical Decision Making:   MUSKAAN WAMMACK is a 89 y.o. female who presented to the ED today with episode of chest pain shortness of breath vital signs reviewed.  My evaluation she is asymptomatic.  Episode was concerning with chest tightness, although not exertional and her troponins are negative x 2.  However she does have some poor R wave progression which appears new from prior EKG.  Lab work otherwise notable for slightly low bicarb although stable from prior as well.  Chest x-ray without pneumonia or pulmonary edema.  Low special for dissection, PE based on exam and history as well as normal vital signs.  Will plan to discuss with cardiology given the EKG changes.   Additional history discussed with patient's family/caregivers.  Patient placed on continuous vitals and telemetry monitoring while in ED which was reviewed periodically.  Reviewed and confirmed nursing documentation for past medical history, family history, social history.  Reassessment and Plan:   Patient's nephew was at bedside.  Patient remains asymptomatic.  Spoke with cardiology Dr. Lorie Rook including patient's history, presentation, EKG.  Recommends outpatient follow-up no indication for admission.  Troponin here was negative and she remains asymptomatic.  Given this think she can follow-up closely outpatient.  Will place cardiology referral.  Strict return precautions given.  Discussed plan with both patient and nephew who are comfortable with this plan.   Patient's presentation is most consistent with acute  complicated illness / injury requiring diagnostic workup.           Final Clinical Impression(s) / ED Diagnoses Final diagnoses:  Chest pain, unspecified type    Rx / DC Orders ED Discharge Orders     None         Coleman Daughters, MD 11/05/23 1115

## 2023-11-06 ENCOUNTER — Other Ambulatory Visit: Payer: Self-pay | Admitting: Internal Medicine

## 2023-11-06 DIAGNOSIS — R079 Chest pain, unspecified: Secondary | ICD-10-CM

## 2023-11-06 NOTE — Progress Notes (Signed)
Echo order placed as follow up of ER visit and in anticipation of appointment Monday 1/20.

## 2023-11-07 ENCOUNTER — Ambulatory Visit (HOSPITAL_COMMUNITY): Payer: Medicare HMO | Attending: Cardiology

## 2023-11-07 DIAGNOSIS — R079 Chest pain, unspecified: Secondary | ICD-10-CM | POA: Diagnosis present

## 2023-11-07 LAB — ECHOCARDIOGRAM COMPLETE
Area-P 1/2: 2.73 cm2
S' Lateral: 2.4 cm

## 2023-11-10 ENCOUNTER — Ambulatory Visit: Payer: Medicare HMO | Attending: Internal Medicine | Admitting: Internal Medicine

## 2023-11-10 ENCOUNTER — Encounter: Payer: Self-pay | Admitting: Internal Medicine

## 2023-11-10 ENCOUNTER — Telehealth: Payer: Self-pay

## 2023-11-10 VITALS — BP 124/70 | HR 73 | Ht 64.0 in | Wt 200.0 lb

## 2023-11-10 DIAGNOSIS — R079 Chest pain, unspecified: Secondary | ICD-10-CM | POA: Diagnosis not present

## 2023-11-10 DIAGNOSIS — E785 Hyperlipidemia, unspecified: Secondary | ICD-10-CM | POA: Diagnosis not present

## 2023-11-10 DIAGNOSIS — I10 Essential (primary) hypertension: Secondary | ICD-10-CM

## 2023-11-10 NOTE — Patient Instructions (Signed)
Medication Instructions:  Your physician recommends that you continue on your current medications as directed. Please refer to the Current Medication list given to you today.  *If you need a refill on your cardiac medications before your next appointment, please call your pharmacy*  Lab Work: None  Testing/Procedures: None  Follow-Up: At [facility], you and your health needs are our priority.  As part of our continuing mission to provide you with exceptional heart care, we have created designated Provider Care Teams.  These Care Teams include your primary Cardiologist (physician) and Advanced Practice Providers (APPs -  Physician Assistants and Nurse Practitioners) who all work together to provide you with the care you need, when you need it.  We recommend signing up for the patient portal called "MyChart".  Sign up information is provided on this After Visit Summary.  MyChart is used to connect with patients for Virtual Visits (Telemedicine).  Patients are able to view lab/test results, encounter notes, upcoming appointments, etc.  Non-urgent messages can be sent to your provider as well.   To learn more about what you can do with MyChart, go to [email or web address].    Your next appointment:   As needed, please call our office for an appointment or with any concerns. [phone]  Provider:   Parke Poisson, MD

## 2023-11-10 NOTE — Progress Notes (Signed)
  Cardiology Office Note:  .   Date:  11/10/2023  ID:  Wendy Andrews, DOB 02/27/1930, MRN [id number] PCP: Wendy Apley, MD  [facility] Providers Cardiologist:  Parke Poisson, MD    History of Present Illness: .   Wendy Andrews is a 89 y.o. female.  Discussed the use of AI scribe software for clinical note transcription with the patient, who gave verbal consent to proceed.  History of Present Illness   The patient, an 89 year old, presents for follow-up after a recent ER visit for chest pain. She denies any recurrence of the chest pain since the ER visit. She describes the chest pain as a 'funny feeling' but does not provide further details. The patient denies any exertional chest discomfort or dyspnea. She is independent in her activities of daily living, including bathing and feeding herself per her report. She attends the visit alone.   The patient's medication regimen includes a daily baby aspirin, a cholesterol medication, a blood pressure medication, and a sleep aid. She denies any issues with her current medications. She does not report any other ongoing health concerns.        ROS: negative except per HPI above.  Studies Reviewed: .        Results   DIAGNOSTIC EKG: Abnormality suggestive of old myocardial infarction Echocardiogram: Normal systolic function, normal diastolic function, normal myocardium, normal right heart, normal heart valves     Risk Assessment/Calculations:             Physical Exam:   VS:  BP 124/70 (BP Location: Left Arm, Patient Position: Sitting, Cuff Size: Normal)   Pulse 73   Ht 5\' 4"  (1.626 m)   Wt 200 lb (90.7 kg)   LMP 10/22/1959   SpO2 95%   BMI 34.33 kg/m    Wt Readings from Last 3 Encounters:  11/10/23 200 lb (90.7 kg)  09/14/23 200 lb (90.7 kg)  02/15/21 188 lb 4.8 oz (85.4 kg)     Physical Exam   VITALS: BP- 124/70, P- normal CARDIOVASCULAR: Heart sounds normal upon auscultation.     GEN: Well nourished,  well developed in no acute distress NECK: No JVD; No carotid bruits CARDIAC: RRR, no murmurs, rubs, gallops RESPIRATORY:  Clear to auscultation without rales, wheezing or rhonchi  ABDOMEN: Soft, non-tender, non-distended EXTREMITIES:  No edema; No deformity   ASSESSMENT AND PLAN: .    Assessment & Plan Chest pain of uncertain etiology  Primary hypertension  Hyperlipidemia, unspecified hyperlipidemia type   Assessment and Plan    Chest Pain Recent episode of chest pain led to ER visit. EKG showed possible old myocardial infarction. However, recent echocardiogram showed healthy heart function and structure. No current chest pain or shortness of breath with activity. -No further cardiology follow-up needed unless chest pain recurs.  Hypertension Well-controlled on current medication regimen. Blood pressure at today's visit was 124/70. -Continue current antihypertensive medication.  Hyperlipidemia On statin therapy. -Continue current statin medication.  General Health Maintenance Independent with activities of daily living. No other health concerns reported. -Continue current medications including baby aspirin and sleep aid. -Return to primary care provider for routine follow-up and management.

## 2024-07-01 ENCOUNTER — Other Ambulatory Visit: Payer: Self-pay

## 2024-07-01 ENCOUNTER — Emergency Department (HOSPITAL_BASED_OUTPATIENT_CLINIC_OR_DEPARTMENT_OTHER)
Admission: EM | Admit: 2024-07-01 | Discharge: 2024-07-02 | Disposition: A | Discharge status: Home / Self Care | Attending: Emergency Medicine | Admitting: Emergency Medicine

## 2024-07-01 ENCOUNTER — Emergency Department (HOSPITAL_BASED_OUTPATIENT_CLINIC_OR_DEPARTMENT_OTHER): Admitting: Radiology

## 2024-07-01 DIAGNOSIS — Z7982 Long term (current) use of aspirin: Secondary | ICD-10-CM | POA: Diagnosis not present

## 2024-07-01 DIAGNOSIS — R09A2 Foreign body sensation, throat: Secondary | ICD-10-CM | POA: Diagnosis present

## 2024-07-01 DIAGNOSIS — R1310 Dysphagia, unspecified: Secondary | ICD-10-CM

## 2024-07-01 LAB — CBC WITH DIFFERENTIAL/PLATELET
Abs Immature Granulocytes: 0.02 K/uL (ref 0.00–0.07)
Basophils Absolute: 0.1 K/uL (ref 0.0–0.1)
Basophils Relative: 1 %
Eosinophils Absolute: 0.2 K/uL (ref 0.0–0.5)
Eosinophils Relative: 3 %
HCT: 35.1 % — ABNORMAL LOW (ref 36.0–46.0)
Hemoglobin: 10.9 g/dL — ABNORMAL LOW (ref 12.0–15.0)
Immature Granulocytes: 0 %
Lymphocytes Relative: 18 %
Lymphs Abs: 1.4 K/uL (ref 0.7–4.0)
MCH: 24.7 pg — ABNORMAL LOW (ref 26.0–34.0)
MCHC: 31.1 g/dL (ref 30.0–36.0)
MCV: 79.4 fL — ABNORMAL LOW (ref 80.0–100.0)
Monocytes Absolute: 0.7 K/uL (ref 0.1–1.0)
Monocytes Relative: 9 %
Neutro Abs: 5.7 K/uL (ref 1.7–7.7)
Neutrophils Relative %: 69 %
Platelets: 313 K/uL (ref 150–400)
RBC: 4.42 MIL/uL (ref 3.87–5.11)
RDW: 17 % — ABNORMAL HIGH (ref 11.5–15.5)
WBC: 8.1 K/uL (ref 4.0–10.5)
nRBC: 0 % (ref 0.0–0.2)

## 2024-07-01 NOTE — ED Provider Notes (Signed)
 Patient with a history of hypertension, previous stroke, hyperlipidemia here from her facility.  EMS was apparently called for difficulty swallowing.  Here she denies symptoms and does not know why she is here.  No chest pain or shortness of breath.  No difficulty breathing or difficulty swallowing.  No distress. Denies sore throat.   Controlling secretions, clear lungs, oropharynx is clear.  Will attempt to contact her facility.  She remains facility was contacted.  Nephew at bedside.  He was told that patient felt a foreign body sensation in her throat.  She denies this now.  Soft tissue neck x-ray and chest x-ray are negative.  Labs are reassuring.  Troponin negative with low concern for ACS.  Tolerating p.o. without difficulty.  Denies chest pain, shortness of breath, difficulty breathing or difficulty swallowing.  Discussed with nephew at bedside.  He feels she is at her baseline.  Will initiate PPI.  Follow-up with PCP.  Return precautions discussed.   Carita Senior, MD 07/02/24 [phone]

## 2024-07-01 NOTE — ED Provider Notes (Signed)
 [place] EMERGENCY DEPARTMENT AT [facility] Provider Note   CSN: [id number] Arrival date & time: 07/01/24  2159     Patient presents with: No chief complaint on file.   Wendy Andrews is a 89 y.o. female who presents from SNF for globus sensation.  She has history of previous CVA, review of previous medical residents did not show any mention of previous dysphagia, no previous swallow studies on record.   HPI     Prior to Admission medications   Medication Sig Start Date End Date Taking? Authorizing Provider  acetaminophen  (TYLENOL ) 325 MG tablet Take 2 tablets (650 mg total) by mouth every 4 (four) hours as needed for mild pain (or temp > 37.5 C (99.5 F)). 09/14/18   Samtani, Jai-Gurmukh, MD  aspirin  EC 81 MG tablet Take 81 mg by mouth daily.    [provider]  ASPIRIN  LOW DOSE 81 MG chewable tablet Chew 81 mg by mouth daily. 09/25/23   [provider]  atorvastatin  (LIPITOR) 20 MG tablet Take 20 mg by mouth daily. 09/05/23   [provider]  atorvastatin  (LIPITOR) 40 MG tablet Take 1 tablet (40 mg total) by mouth daily at 6 PM. 09/14/18   Samtani, Jai-Gurmukh, MD  doxycycline  (VIBRAMYCIN ) 100 MG capsule Take 1 capsule (100 mg total) by mouth 2 (two) times daily. One po bid x 7 days 09/14/23   Freddi Hamilton, MD  levothyroxine  (SYNTHROID , LEVOTHROID) 75 MCG tablet Take 75 mcg by mouth daily.    [provider]  lisinopril  (PRINIVIL ,ZESTRIL ) 20 MG tablet Take 20 mg by mouth daily.     [provider]  LORazepam  (ATIVAN ) 1 MG tablet Take 1 tablet (1 mg total) by mouth daily as needed for anxiety. 09/14/18   Samtani, Jai-Gurmukh, MD  meloxicam (MOBIC) 7.5 MG tablet Take 7.5 mg by mouth daily.    [provider]  traZODone  (DESYREL ) 150 MG tablet Take 1 tablet (150 mg total) by mouth at bedtime. 09/14/18   Samtani, Jai-Gurmukh, MD  traZODone  (DESYREL ) 50 MG tablet Take 50 mg by mouth at bedtime. 09/12/23   [provider]    Allergies: Tetanus toxoid-containing vaccines and Penicillins    Review of Systems  Constitutional:  Negative for chills and fever.  HENT:  Negative for sore throat.   Eyes:  Negative for visual disturbance.  Respiratory:  Negative for cough and shortness of breath.   Cardiovascular:  Negative for chest pain.  Gastrointestinal:  Negative for abdominal pain, diarrhea, nausea and vomiting.  Genitourinary:  Negative for dysuria and frequency.  Musculoskeletal:  Negative for back pain and neck pain.  Skin:  Negative for rash.  Neurological:  Negative for weakness, numbness and headaches.  Hematological:  Negative for adenopathy.  Psychiatric/Behavioral:  Negative for behavioral problems.     Updated Vital Signs BP (!) 148/87 (BP Location: Right Arm)   Pulse 83   Temp (!) 97.5 F (36.4 C) (Oral)   Resp 17   LMP 10/22/1959   SpO2 98%   Physical Exam Vitals and nursing note reviewed.  Constitutional:      General: She is not in acute distress.    Appearance: Normal appearance.  HENT:     Head: Normocephalic and atraumatic.     Mouth/Throat:     Lips: Pink.     Mouth: Mucous membranes are moist.     Pharynx: Oropharynx is clear.  Eyes:     Extraocular Movements: Extraocular movements intact.  Conjunctiva/sclera: Conjunctivae normal.     Pupils: Pupils are equal, round, and reactive to light.  Cardiovascular:     Rate and Rhythm: Normal rate and regular rhythm.     Pulses: Normal pulses.     Heart sounds: Normal heart sounds. No murmur heard.    No friction rub. No gallop.  Pulmonary:     Effort: Pulmonary effort is normal.     Breath sounds: Normal breath sounds.  Abdominal:     General: Abdomen is flat. Bowel sounds are normal.     Palpations: Abdomen is soft.  Musculoskeletal:        General: Normal range of motion.     Cervical back: Normal range of motion and neck supple.     Right lower leg: No edema.     Left lower leg: No edema.  Skin:     General: Skin is warm and dry.     Capillary Refill: Capillary refill takes less than 2 seconds.  Neurological:     General: No focal deficit present.     Mental Status: She is alert and oriented to person, place, and time. Mental status is at baseline.  Psychiatric:        Mood and Affect: Mood normal.     (all labs ordered are listed, but only abnormal results are displayed) Labs Reviewed - No data to display  EKG: None  Radiology: No results found.   Procedures   Medications Ordered in the ED - No data to display                                  Medical Decision Making Amount and/or Complexity of Data Reviewed Labs: ordered. Radiology: ordered. ECG/medicine tests: ordered.   Physical exam is reassuring as patient is keeping clear airway, and is breathing normally, posterior oropharynx is clear.  She does not have any current symptoms at the current time, will p.o. challenge patient to evaluate ability to swallow.  Lab evaluation ordered and is pending at time of care handover.  Anticipate discharge if passed p.o. challenge. Care handed off to S. Rancour, MD.     Final diagnoses:  None    ED Discharge Orders     None          Myriam Dorn BROCKS, [place] 07/01/24 2334    Carita Senior, MD 07/02/24 [phone]

## 2024-07-01 NOTE — ED Triage Notes (Signed)
 BIB [facility] from [facility].   Globus. Unsure what could be causing this- did not eat dinner. Pt talks clearly in full sentences. Somewhat painful to swallow. Throat and lungs sound clear. No object seen in oropharynx-difficulty completely visualizing tonsil.

## 2024-07-02 DIAGNOSIS — R09A2 Foreign body sensation, throat: Secondary | ICD-10-CM | POA: Diagnosis not present

## 2024-07-02 LAB — COMPREHENSIVE METABOLIC PANEL WITH GFR
ALT: 11 U/L (ref 0–44)
AST: 17 U/L (ref 15–41)
Albumin: 3.7 g/dL (ref 3.5–5.0)
Alkaline Phosphatase: 110 U/L (ref 38–126)
Anion gap: 13 (ref 5–15)
BUN: 21 mg/dL (ref 8–23)
CO2: 20 mmol/L — ABNORMAL LOW (ref 22–32)
Calcium: 9.3 mg/dL (ref 8.9–10.3)
Chloride: 108 mmol/L (ref 98–111)
Creatinine, Ser: 0.98 mg/dL (ref 0.44–1.00)
GFR, Estimated: 54 mL/min — ABNORMAL LOW (ref >60)
Glucose, Bld: 101 mg/dL — ABNORMAL HIGH (ref 70–99)
Potassium: 4.4 mmol/L (ref 3.5–5.1)
Sodium: 140 mmol/L (ref 135–145)
Total Bilirubin: 0.2 mg/dL (ref 0.0–1.2)
Total Protein: 6.6 g/dL (ref 6.5–8.1)

## 2024-07-02 LAB — TROPONIN T, HIGH SENSITIVITY
Troponin T High Sensitivity: 15 ng/L (ref 0–19)
Troponin T High Sensitivity: 18 ng/L (ref 0–19)

## 2024-07-02 MED ORDER — ALUM & MAG HYDROXIDE-SIMETH 200-200-20 MG/5ML PO SUSP
30.0000 mL | Freq: Once | ORAL | Status: DC
Start: 2024-07-02 — End: 2024-07-02
  Filled 2024-07-02: qty 30

## 2024-07-02 MED ORDER — OMEPRAZOLE 20 MG PO CPDR: 20.0000 mg | DELAYED_RELEASE_CAPSULE | Freq: Every day | ORAL | 0 refills | Status: AC

## 2024-07-02 MED ORDER — LIDOCAINE VISCOUS HCL 2 % MT SOLN
15.0000 mL | Freq: Once | OROMUCOSAL | Status: DC
Start: 2024-07-02 — End: 2024-07-02
  Filled 2024-07-02: qty 15

## 2024-07-02 NOTE — ED Notes (Signed)
 Water given for fluid challenge. Pt was able to drink without stopping, choking, or coughing. No complaints of pain or illness at this time

## 2024-07-02 NOTE — Discharge Instructions (Signed)
 Take present medication as prescribed.  Follow-up with your doctor.  Return to the ED with new or worsening symptoms.

## 2024-07-02 NOTE — ED Notes (Signed)
 ED Provider at bedside.
